# Patient Record
Sex: Male | Born: 1935
Health system: Southern US, Community
[De-identification: ages and names within clinical notes are randomized; demographics above are authoritative.]

## PROBLEM LIST (undated history)

## (undated) DIAGNOSIS — I10 Essential (primary) hypertension: Secondary | ICD-10-CM

## (undated) DIAGNOSIS — I82409 Acute embolism and thrombosis of unspecified deep veins of unspecified lower extremity: Secondary | ICD-10-CM

## (undated) DIAGNOSIS — G629 Polyneuropathy, unspecified: Secondary | ICD-10-CM

## (undated) DIAGNOSIS — H8110 Benign paroxysmal vertigo, unspecified ear: Secondary | ICD-10-CM

## (undated) DIAGNOSIS — I499 Cardiac arrhythmia, unspecified: Secondary | ICD-10-CM

## (undated) DIAGNOSIS — E785 Hyperlipidemia, unspecified: Secondary | ICD-10-CM

## (undated) DIAGNOSIS — M1990 Unspecified osteoarthritis, unspecified site: Secondary | ICD-10-CM

## (undated) DIAGNOSIS — H409 Unspecified glaucoma: Secondary | ICD-10-CM

## (undated) DIAGNOSIS — F0390 Unspecified dementia without behavioral disturbance: Secondary | ICD-10-CM

## (undated) DIAGNOSIS — I82401 Acute embolism and thrombosis of unspecified deep veins of right lower extremity: Secondary | ICD-10-CM

## (undated) DIAGNOSIS — N200 Calculus of kidney: Secondary | ICD-10-CM

## (undated) DIAGNOSIS — N400 Enlarged prostate without lower urinary tract symptoms: Secondary | ICD-10-CM

## (undated) DIAGNOSIS — N183 Chronic kidney disease, stage 3 unspecified: Secondary | ICD-10-CM

## (undated) HISTORY — DX: Unspecified osteoarthritis, unspecified site: M19.90

## (undated) HISTORY — DX: Polyneuropathy, unspecified: G62.9

## (undated) HISTORY — DX: Chronic kidney disease, stage 3 unspecified: N18.30

## (undated) HISTORY — PX: NO PAST SURGERIES: SHX2092

## (undated) HISTORY — DX: Unspecified glaucoma: H40.9

## (undated) HISTORY — DX: Essential (primary) hypertension: I10

## (undated) HISTORY — DX: Acute embolism and thrombosis of unspecified deep veins of right lower extremity: I82.401

## (undated) HISTORY — DX: Hyperlipidemia, unspecified: E78.5

## (undated) HISTORY — DX: Cardiac arrhythmia, unspecified: I49.9

## (undated) HISTORY — DX: Calculus of kidney: N20.0

## (undated) HISTORY — DX: Benign paroxysmal vertigo, unspecified ear: H81.10

## (undated) HISTORY — PX: COLONOSCOPY: SHX174

---

## 2007-11-16 ENCOUNTER — Inpatient Hospital Stay: Payer: Self-pay | Admitting: Internal Medicine

## 2007-11-16 ENCOUNTER — Other Ambulatory Visit: Payer: Self-pay

## 2007-11-17 ENCOUNTER — Other Ambulatory Visit: Payer: Self-pay

## 2009-11-29 LAB — HM COLONOSCOPY: HM Colonoscopy: NORMAL

## 2011-02-02 ENCOUNTER — Ambulatory Visit: Payer: Self-pay

## 2011-02-19 ENCOUNTER — Ambulatory Visit: Payer: Self-pay | Admitting: Family Medicine

## 2013-08-02 ENCOUNTER — Ambulatory Visit: Payer: Self-pay | Admitting: Family Medicine

## 2013-08-02 LAB — HEMOGLOBIN A1C: Hgb A1c MFr Bld: 5.5 % (ref 4.0–6.0)

## 2013-12-08 LAB — LIPID PANEL
Cholesterol: 173 mg/dL (ref 0–200)
HDL: 63 mg/dL (ref 35–70)
LDL Cholesterol: 93 mg/dL
Triglycerides: 85 mg/dL (ref 40–160)

## 2014-08-23 ENCOUNTER — Ambulatory Visit (INDEPENDENT_AMBULATORY_CARE_PROVIDER_SITE_OTHER): Payer: Commercial Managed Care - HMO | Admitting: Family Medicine

## 2014-08-23 ENCOUNTER — Encounter: Payer: Self-pay | Admitting: Family Medicine

## 2014-08-23 VITALS — BP 118/70 | HR 88 | Temp 98.2°F | Resp 16 | Ht 69.0 in | Wt 193.4 lb

## 2014-08-23 DIAGNOSIS — N183 Chronic kidney disease, stage 3 unspecified: Secondary | ICD-10-CM | POA: Insufficient documentation

## 2014-08-23 DIAGNOSIS — R600 Localized edema: Secondary | ICD-10-CM | POA: Insufficient documentation

## 2014-08-23 DIAGNOSIS — H8110 Benign paroxysmal vertigo, unspecified ear: Secondary | ICD-10-CM | POA: Insufficient documentation

## 2014-08-23 DIAGNOSIS — Z125 Encounter for screening for malignant neoplasm of prostate: Secondary | ICD-10-CM

## 2014-08-23 DIAGNOSIS — G629 Polyneuropathy, unspecified: Secondary | ICD-10-CM | POA: Insufficient documentation

## 2014-08-23 DIAGNOSIS — Z0000 Encounter for general adult medical examination without abnormal findings: Secondary | ICD-10-CM | POA: Diagnosis not present

## 2014-08-23 LAB — POC HEMOCCULT BLD/STL (OFFICE/1-CARD/DIAGNOSTIC): Fecal Occult Blood, POC: NEGATIVE

## 2014-08-23 NOTE — Addendum Note (Signed)
Addended by: Ashok Norris on: 08/23/2014 01:36 PM   Modules accepted: Miquel Dunn

## 2014-08-23 NOTE — Progress Notes (Signed)
Name: Ronald Fleming   MRN: [id number]    DOB: Jun 14, 1935   Date:08/23/2014       Progress Note  Subjective  Chief Complaint  Chief Complaint  Patient presents with  . Annual Exam    HPI   79 year old male presents for his annual H&P.  Past Medical History  Diagnosis Date  . Arrhythmia   . Neuropathy   . Benign positional vertigo     History  Substance Use Topics  . Smoking status: Never Smoker   . Smokeless tobacco: Not on file  . Alcohol Use: No     Current outpatient prescriptions:  .  dabigatran (PRADAXA) 150 MG CAPS capsule, Take 150 mg by mouth 2 (two) times daily., Disp: , Rfl:  .  doxazosin (CARDURA) 4 MG tablet, Take 4 mg by mouth daily., Disp: , Rfl:  .  lisinopril (PRINIVIL,ZESTRIL) 20 MG tablet, Take 20 mg by mouth daily., Disp: , Rfl:  .  lovastatin (MEVACOR) 20 MG tablet, Take 20 mg by mouth at bedtime., Disp: , Rfl:   No Known Allergies  Review of Systems  Constitutional: Negative for fever, chills and weight loss.  HENT: Negative for congestion, hearing loss, sore throat and tinnitus.   Eyes: Negative for blurred vision, double vision and redness.  Respiratory: Negative for cough, hemoptysis and shortness of breath.   Cardiovascular: Positive for palpitations (followed by cardilologist). Negative for chest pain, orthopnea, claudication and leg swelling.  Gastrointestinal: Negative for heartburn, nausea, vomiting, diarrhea, constipation and blood in stool.  Genitourinary: Negative for dysuria, urgency, frequency and hematuria.  Musculoskeletal: Negative for myalgias, back pain, joint pain, falls and neck pain.  Skin: Negative for itching.  Neurological: Negative for dizziness, tingling, tremors, focal weakness, seizures, loss of consciousness, weakness and headaches.  Endo/Heme/Allergies: Does not bruise/bleed easily.  Psychiatric/Behavioral: Negative for depression and substance abuse. The patient is not nervous/anxious and does not have insomnia.       Objective  Filed Vitals:   08/23/14 1049  BP: 118/70  Pulse: 88  Temp: 98.2 F (36.8 C)  Resp: 16  Height: 5\' 9"  (1.753 m)  Weight: 193 lb 6 oz (87.714 kg)  SpO2: 97%     Physical Exam  Constitutional: He is oriented to person, place, and time and well-developed, well-nourished, and in no distress.  HENT:  Head: Normocephalic.  Eyes: EOM are normal. Pupils are equal, round, and reactive to light.  Neck: Normal range of motion. Neck supple. No thyromegaly present.  Cardiovascular: Normal rate, regular rhythm and normal heart sounds.   No murmur heard. Pulmonary/Chest: Effort normal and breath sounds normal. No respiratory distress. He has no wheezes.  Abdominal: Soft. Bowel sounds are normal.  Musculoskeletal: Normal range of motion. He exhibits no edema.  Lymphadenopathy:    He has no cervical adenopathy.  Neurological: He is alert and oriented to person, place, and time. No cranial nerve deficit. Gait normal. Coordination normal.  Skin: Skin is warm and dry. No rash noted.  Psychiatric: Affect and judgment normal.      Assessment & Plan  1. Routine medical exam   2. Prostate cancer screening  - PSA

## 2014-08-23 NOTE — Addendum Note (Signed)
Addended by: Ashok Norris on: 08/23/2014 01:32 PM   Modules accepted: Miquel Dunn

## 2014-08-23 NOTE — Patient Instructions (Signed)
F/u 1 mo for htn, hyperlipidemia

## 2014-08-24 LAB — PSA: Prostate Specific Ag, Serum: 6.1 ng/mL — ABNORMAL HIGH (ref 0.0–4.0)

## 2014-08-26 NOTE — Addendum Note (Signed)
Addended by: Lolita Rieger D on: 08/26/2014 08:14 AM   Modules accepted: Orders

## 2014-08-26 NOTE — Progress Notes (Signed)
Spoke to pt wife and went over results with her. She stated that they are going out of town for 2 weeks and wanted the appt to be made after that time.

## 2014-09-05 ENCOUNTER — Telehealth: Payer: Self-pay | Admitting: Family Medicine

## 2014-09-05 NOTE — Telephone Encounter (Signed)
Arlina Robes (wife) states that patient has been referred to Dr Tammi Klippel (urology)  For 09-13-14. They are needing to reschedule the appointment because he is currently out of town and will not return until late that day. Please reschedule this for them. They keep trying to call to reschedule but is not ablel to get through to that office. Please call and leave message on both phones with the new appointment date. Thank you [phone] and [phone]

## 2014-09-06 NOTE — Telephone Encounter (Signed)
Informed [facility] of the new appointment time. Thank you

## 2014-09-06 NOTE — Telephone Encounter (Signed)
Spoke to [place] at [facility]. Cancel June 28 appointment and rescheduled appointment to September 27, 2014 at 8:30 am with Dr. Louis Meckel

## 2014-09-13 ENCOUNTER — Ambulatory Visit: Payer: Commercial Managed Care - HMO

## 2014-09-22 ENCOUNTER — Other Ambulatory Visit: Payer: Self-pay | Admitting: Family Medicine

## 2014-09-22 ENCOUNTER — Encounter: Payer: Self-pay | Admitting: Family Medicine

## 2014-09-22 ENCOUNTER — Ambulatory Visit (INDEPENDENT_AMBULATORY_CARE_PROVIDER_SITE_OTHER): Payer: Commercial Managed Care - HMO | Admitting: Family Medicine

## 2014-09-22 VITALS — BP 124/68 | HR 80 | Temp 98.1°F | Resp 16 | Ht 69.0 in | Wt 193.8 lb

## 2014-09-22 DIAGNOSIS — Z86718 Personal history of other venous thrombosis and embolism: Secondary | ICD-10-CM

## 2014-09-22 DIAGNOSIS — N183 Chronic kidney disease, stage 3 unspecified: Secondary | ICD-10-CM

## 2014-09-22 DIAGNOSIS — I4891 Unspecified atrial fibrillation: Secondary | ICD-10-CM | POA: Diagnosis not present

## 2014-09-22 DIAGNOSIS — I10 Essential (primary) hypertension: Secondary | ICD-10-CM | POA: Diagnosis not present

## 2014-09-22 DIAGNOSIS — R600 Localized edema: Secondary | ICD-10-CM | POA: Diagnosis not present

## 2014-09-22 DIAGNOSIS — I80201 Phlebitis and thrombophlebitis of unspecified deep vessels of right lower extremity: Secondary | ICD-10-CM

## 2014-09-22 DIAGNOSIS — E7800 Pure hypercholesterolemia, unspecified: Secondary | ICD-10-CM

## 2014-09-22 MED ORDER — DABIGATRAN ETEXILATE MESYLATE 150 MG PO CAPS
150.0000 mg | ORAL_CAPSULE | Freq: Two times a day (BID) | ORAL | Status: DC
Start: 2014-09-22 — End: 2014-09-22

## 2014-09-22 NOTE — Patient Instructions (Signed)
Four-month follow-up

## 2014-09-22 NOTE — Progress Notes (Signed)
Name: Ronald Fleming   MRN: [id number]    DOB: 10/18/1935   Date:09/22/2014       Progress Note  Subjective  Chief Complaint  Chief Complaint  Patient presents with  . Hypertension  . Hyperlipidemia  . Atrial Fibrillation    Hypertension This is a chronic problem. The current episode started more than 1 year ago. The problem is unchanged. The problem is controlled. Associated symptoms include anxiety and palpitations. Pertinent negatives include no blurred vision, chest pain, headaches, neck pain, orthopnea or shortness of breath. There are no associated agents to hypertension. Risk factors for coronary artery disease include dyslipidemia, male gender and sedentary lifestyle. Past treatments include ACE inhibitors. The current treatment provides moderate improvement. There are no compliance problems.   Hyperlipidemia This is a chronic problem. The current episode started more than 1 year ago. The problem is controlled. Recent lipid tests were reviewed and are normal. Factors aggravating his hyperlipidemia include fatty foods and thiazides. Pertinent negatives include no chest pain, focal weakness, myalgias or shortness of breath. Current antihyperlipidemic treatment includes statins. There are no compliance problems.  Risk factors for coronary artery disease include dyslipidemia, hypertension, male sex and a sedentary lifestyle.  Atrial Fibrillation Presents for follow-up visit. Symptoms include hypertension and palpitations. Symptoms are negative for chest pain, dizziness, shortness of breath and weakness. The symptoms have been resolved. Compliance with prior treatments has been good. Past medical history includes atrial fibrillation and hyperlipidemia.    History of DVT of the right lower extremity  The patient has remote history of right lower extremity DVT. He's currently production on a regular basis for his atrial fibrillation. He's had no recent swelling edema or pain of the right lower  extremity.  Past Medical History  Diagnosis Date  . Arrhythmia   . Neuropathy   . Benign positional vertigo   . Hypertension   . Hyperlipidemia   . Deep vein blood clot of right lower extremity     History  Substance Use Topics  . Smoking status: Never Smoker   . Smokeless tobacco: Not on file  . Alcohol Use: No     Current outpatient prescriptions:  .  aspirin 81 MG tablet, Take 81 mg by mouth daily., Disp: , Rfl:  .  dabigatran (PRADAXA) 150 MG CAPS capsule, Take 1 capsule (150 mg total) by mouth 2 (two) times daily., Disp: 60 capsule, Rfl: 5 .  doxazosin (CARDURA) 4 MG tablet, Take 4 mg by mouth daily., Disp: , Rfl:  .  lisinopril (PRINIVIL,ZESTRIL) 20 MG tablet, Take 20 mg by mouth daily., Disp: , Rfl:  .  lovastatin (MEVACOR) 20 MG tablet, Take 20 mg by mouth at bedtime., Disp: , Rfl:   No Known Allergies  Review of Systems  Constitutional: Negative for fever, chills and weight loss.  HENT: Negative for congestion, hearing loss, sore throat and tinnitus.   Eyes: Negative for blurred vision, double vision and redness.  Respiratory: Negative for cough, hemoptysis and shortness of breath.   Cardiovascular: Positive for palpitations. Negative for chest pain, orthopnea, claudication and leg swelling.  Gastrointestinal: Negative for heartburn, nausea, vomiting, diarrhea, constipation and blood in stool.  Genitourinary: Negative for dysuria, urgency, frequency and hematuria.  Musculoskeletal: Negative for myalgias, back pain, joint pain, falls and neck pain.  Skin: Negative for itching.  Neurological: Negative for dizziness, tingling, tremors, focal weakness, seizures, loss of consciousness, weakness and headaches.  Endo/Heme/Allergies: Does not bruise/bleed easily.  Psychiatric/Behavioral: Negative for depression and substance abuse.  The patient is not nervous/anxious and does not have insomnia.      Objective  Filed Vitals:   09/22/14 0905  BP: 124/68  Pulse: 80   Temp: 98.1 F (36.7 C)  TempSrc: Oral  Resp: 16  Height: 5\' 9"  (1.753 m)  Weight: 193 lb 12.8 oz (87.907 kg)  SpO2: 98%     Physical Exam  Constitutional: He is oriented to person, place, and time and well-developed, well-nourished, and in no distress.  HENT:  Head: Normocephalic.  Eyes: EOM are normal. Pupils are equal, round, and reactive to light.  Neck: Normal range of motion. Neck supple. No thyromegaly present.  Cardiovascular: Normal rate, regular rhythm and normal heart sounds.   No murmur heard. Pulmonary/Chest: Effort normal and breath sounds normal. No respiratory distress. He has no wheezes.  Abdominal: Soft. Bowel sounds are normal.  Musculoskeletal: Normal range of motion. He exhibits no edema.  Lymphadenopathy:    He has no cervical adenopathy.  Neurological: He is alert and oriented to person, place, and time. No cranial nerve deficit. Gait normal. Coordination normal.  Skin: Skin is warm and dry. No rash noted.  Psychiatric: Affect and judgment normal.      Assessment & Plan  1. Atrial fibrillation, unspecified Stable  2. Benign essential HTN Controlled  3. Phlebitis and thrombophlebitis of deep vessels of lower extremities, right No recurrence  4. Chronic kidney disease, stage III (moderate) Resolved  5. Edema of right lower extremity Resolved  6. Hypercholesteremia Well-controlled  7. History of DVT of lower extremity Remotely

## 2014-09-27 ENCOUNTER — Ambulatory Visit (INDEPENDENT_AMBULATORY_CARE_PROVIDER_SITE_OTHER): Payer: Commercial Managed Care - HMO | Admitting: Urology

## 2014-09-27 ENCOUNTER — Encounter: Payer: Self-pay | Admitting: Urology

## 2014-09-27 VITALS — BP 121/68 | HR 69 | Ht 71.0 in | Wt 191.8 lb

## 2014-09-27 DIAGNOSIS — R972 Elevated prostate specific antigen [PSA]: Secondary | ICD-10-CM

## 2014-09-27 NOTE — Progress Notes (Signed)
I have been asked to see the patient by Dr. Ashok Norris, for evaluation and management of an elevated PSA.  History of present illness: Patient was found to have an elevated PSA which was drawn as part of a prostate cancer screening.  He has no family history of prostate cancer.  The patient denies any bone pain, new back pain, or lower extremity edema.  The patient denies any changes in his voiding symptoms over the last 6 months.  Specifically he denies dysuria or hematuria. The patient relates that he had a negative prostate biopsy in 2012 Dr. Olena Heckle a urologist to formally practiced in [facility].  PSA History: 6.1 on 08/23/14 4.7 on 01/2012  IPSS:     IPSS      09/27/14 0800       International Prostate Symptom Score   How often have you had the sensation of not emptying your bladder? Not at All     How often have you had to urinate less than every two hours? Less than 1 in 5 times     How often have you found you stopped and started again several times when you urinated? Not at All     How often have you found it difficult to postpone urination? About half the time     How often have you had a weak urinary stream? Not at All     How often have you had to strain to start urination? Not at All     How many times did you typically get up at night to urinate? 2 Times     Total IPSS Score 6     Quality of Life due to urinary symptoms   If you were to spend the rest of your life with your urinary condition just the way it is now how would you feel about that? Pleased          Review of systems: A 12 point comprehensive review of systems was obtained and is negative unless otherwise stated in the history of present illness.  Patient Active Problem List   Diagnosis Date Noted  . A-fib 08/23/2014  . Benign paroxysmal positional nystagmus 08/23/2014  . Edema leg 08/23/2014  . Hypercholesteremia 08/23/2014  . BP (high blood pressure) 08/23/2014  . Chronic kidney  disease, stage III (moderate) 08/23/2014  . Bad memory 08/23/2014  . Neuropathy 08/23/2014  . Phlebitis and thrombophlebitis of deep vessels of lower extremities 02/24/2008  . Benign essential HTN 08/27/2006  . Hypercholesterolemia without hypertriglyceridemia 08/27/2006    Current Outpatient Prescriptions on File Prior to Visit  Medication Sig Dispense Refill  . aspirin 81 MG tablet Take 81 mg by mouth daily.    Marland Kitchen doxazosin (CARDURA) 4 MG tablet Take 4 mg by mouth daily.    Marland Kitchen lisinopril (PRINIVIL,ZESTRIL) 20 MG tablet Take 20 mg by mouth daily.    Marland Kitchen lovastatin (MEVACOR) 20 MG tablet Take 20 mg by mouth at bedtime.    Marland Kitchen PRADAXA 150 MG CAPS capsule TAKE ONE CAPSULE BY MOUTH TWICE DAILY 60 capsule 0   No current facility-administered medications on file prior to visit.    Past Medical History  Diagnosis Date  . Arrhythmia   . Neuropathy   . Benign positional vertigo   . Hypertension   . Hyperlipidemia   . Deep vein blood clot of right lower extremity   . Arthritis   . Glaucoma     Past Surgical History  Procedure Laterality Date  .  No past surgeries      History  Substance Use Topics  . Smoking status: Never Smoker   . Smokeless tobacco: Not on file  . Alcohol Use: No    Family History  Problem Relation Age of Onset  . Coronary artery disease Father   . Kidney disease Neg Hx   . Prostate cancer Neg Hx   . Bladder Cancer Neg Hx     PE: Filed Vitals:   09/27/14 0827  BP: 121/68  Pulse: 69  Height: 5\' 11"  (1.803 m)  Weight: 87 kg (191 lb 12.8 oz)   Patient appears to be in no acute distress  patient is alert and oriented x3 Atraumatic normocephalic head No cervical or supraclavicular lymphadenopathy appreciated No increased work of breathing, no audible wheezes/rhonchi No peripheral edema Abdomen is soft, nontender, nondistended, no CVA or suprapubic tenderness Digital rectal exam reveals a +2 prostate in size, symmetric without nodularity Lower  extremities are symmetric without appreciable edema Grossly neurologically intact No identifiable skin lesions  No results for input(s): WBC, HGB, HCT in the last 72 hours. No results for input(s): NA, K, CL, CO2, GLUCOSE, BUN, CREATININE, CALCIUM in the last 72 hours. No results for input(s): LABPT, INR in the last 72 hours. No results for input(s): LABURIN in the last 72 hours. No results found for this or any previous visit.  Imaging: none  Imp: 79 year old male with a mildly elevated PSA without any other significant symptoms including progressive lower urinary tract symptoms, hematuria, dysuria, bone or back pain, or constitutional symptoms. He has moderate comorbidities. Recommendations: I discussed with the patient the implications of an elevated PSA. Specifically, I explained to the patient that in a 79 year old gentleman with a mildly elevated PSA with a fairly recent negative prostate biopsy that his risk for advanced or aggressive prostate cancer was fairly low. Further, the risk of the patient dying from low-grade prostate cancer at this point in his life is also very low. We typically stop screening between the ages of 83 and 58 for this reason. At his age, the treatment for prostate cancer is palliative most often and is not initiated until the patient becomes symptomatic including worsening voiding symptoms or bone pain. As such, I do not recommend that we check anymore PSAs unless the patient becomes symptomatic as stated. However, I do think that an annual digital rectal exam still has relevance, because if it is abnormal with a nodule, that is indicative of advanced disease and at that point the patient would benefit from closer surveillance/androgen deprivation therapy.  In summary: Stop checking PSAs Monitor the patient clinically for progressive voiding symptoms, hematuria, or bone pain Check digital rectal exam annually Follow-up when necessary  Ronald Fleming  W

## 2014-10-10 ENCOUNTER — Other Ambulatory Visit: Payer: Self-pay

## 2014-10-10 DIAGNOSIS — Z1211 Encounter for screening for malignant neoplasm of colon: Secondary | ICD-10-CM

## 2014-10-10 DIAGNOSIS — Z0000 Encounter for general adult medical examination without abnormal findings: Secondary | ICD-10-CM

## 2014-10-10 LAB — POC HEMOCCULT BLD/STL (HOME/3-CARD/SCREEN)
Card #3 Fecal Occult Blood, POC: NEGATIVE
FECAL OCCULT BLD: NEGATIVE
Fecal Occult Blood, POC: NEGATIVE

## 2014-10-22 ENCOUNTER — Other Ambulatory Visit: Payer: Self-pay | Admitting: Family Medicine

## 2014-11-07 ENCOUNTER — Other Ambulatory Visit: Payer: Self-pay | Admitting: Family Medicine

## 2014-11-21 ENCOUNTER — Other Ambulatory Visit: Payer: Self-pay | Admitting: Family Medicine

## 2014-12-22 ENCOUNTER — Other Ambulatory Visit: Payer: Self-pay | Admitting: Family Medicine

## 2015-01-18 ENCOUNTER — Telehealth: Payer: Self-pay | Admitting: Family Medicine

## 2015-01-18 NOTE — Telephone Encounter (Signed)
ERRENOUS °

## 2015-01-23 ENCOUNTER — Other Ambulatory Visit: Payer: Self-pay | Admitting: Family Medicine

## 2015-01-24 ENCOUNTER — Ambulatory Visit: Payer: Commercial Managed Care - HMO | Admitting: Family Medicine

## 2015-02-01 ENCOUNTER — Ambulatory Visit (INDEPENDENT_AMBULATORY_CARE_PROVIDER_SITE_OTHER): Payer: Commercial Managed Care - HMO | Admitting: Family Medicine

## 2015-02-01 ENCOUNTER — Encounter: Payer: Self-pay | Admitting: Family Medicine

## 2015-02-01 VITALS — BP 112/72 | HR 85 | Temp 97.8°F | Resp 16 | Ht 71.0 in | Wt 195.1 lb

## 2015-02-01 DIAGNOSIS — I80201 Phlebitis and thrombophlebitis of unspecified deep vessels of right lower extremity: Secondary | ICD-10-CM | POA: Diagnosis not present

## 2015-02-01 DIAGNOSIS — I4891 Unspecified atrial fibrillation: Secondary | ICD-10-CM | POA: Insufficient documentation

## 2015-02-01 DIAGNOSIS — I10 Essential (primary) hypertension: Secondary | ICD-10-CM | POA: Diagnosis not present

## 2015-02-01 DIAGNOSIS — Z23 Encounter for immunization: Secondary | ICD-10-CM

## 2015-02-01 DIAGNOSIS — I480 Paroxysmal atrial fibrillation: Secondary | ICD-10-CM | POA: Diagnosis not present

## 2015-02-01 DIAGNOSIS — E7800 Pure hypercholesterolemia, unspecified: Secondary | ICD-10-CM | POA: Diagnosis not present

## 2015-02-01 NOTE — Progress Notes (Signed)
Name: Ronald Fleming   MRN: [id number]    DOB: 03/18/1936   Date:02/01/2015       Progress Note  Subjective  Chief Complaint  Chief Complaint  Patient presents with  . Hypertension    4 month recheck  . Hyperlipidemia  . Atrial Fibrillation    HPI  Hypertension   Patient presents for follow-up of hypertension. It has been present for over over 5 years.  Patient states that there is compliance with medical regimen which consists of lisinopril 20 mg daily. There is no end organ disease. Cardiac risk factors include hypertension hyperlipidemia and diabetes.  Exercise regimen consist of walking .  Diet consist of sodium restriction .  Hyperlipidemia  Patient has a history of hyperlipidemia for over 5 years.  Current medical regimen consist of lovastatin 20 mg daily at bedtime .  Compliance is good .  Diet and exercise are currently followed regularly .  Risk factors for cardiovascular disease include hyperlipidemia and hypertension .   There have been no side effects from the medication.    Atrial fibrillation  Patient has not noted any irregularity of his heartbeat of recent. He has seen the cardiologist with regularity. He is currently on a regimen consisting of aspirin 81 mg daily per DEXA 150 mg daily. He is not on any rate limiting agent at this time.   Past Medical History  Diagnosis Date  . Arrhythmia   . Neuropathy ([place])   . Benign positional vertigo   . Hypertension   . Hyperlipidemia   . Deep vein blood clot of right lower extremity ([place])   . Arthritis   . Glaucoma     Social History  Substance Use Topics  . Smoking status: Never Smoker   . Smokeless tobacco: Not on file  . Alcohol Use: No     Current outpatient prescriptions:  .  aspirin 81 MG tablet, Take 81 mg by mouth daily., Disp: , Rfl:  .  doxazosin (CARDURA) 4 MG tablet, TAKE 1 TABLET EVERY DAY, Disp: 90 tablet, Rfl: 3 .  lisinopril (PRINIVIL,ZESTRIL) 20 MG tablet, TAKE 1 TABLET EVERY DAY, Disp: 90  tablet, Rfl: 1 .  lovastatin (MEVACOR) 20 MG tablet, TAKE 1 TABLET EVERY DAY, Disp: 90 tablet, Rfl: 1 .  PRADAXA 150 MG CAPS capsule, TAKE ONE CAPSULE BY MOUTH TWICE DAILY, Disp: 60 capsule, Rfl: 0  No Known Allergies  Review of Systems  Constitutional: Negative for fever, chills and weight loss.  HENT: Negative for congestion, hearing loss, sore throat and tinnitus.   Eyes: Negative for blurred vision, double vision and redness.  Respiratory: Negative for cough, hemoptysis and shortness of breath.   Cardiovascular: Positive for palpitations. Negative for chest pain, orthopnea, claudication and leg swelling.  Gastrointestinal: Negative for heartburn, nausea, vomiting, diarrhea, constipation and blood in stool.  Genitourinary: Negative for dysuria, urgency, frequency and hematuria.  Musculoskeletal: Negative for myalgias, back pain, joint pain, falls and neck pain.  Skin: Negative for itching.  Neurological: Negative for dizziness, tingling, tremors, focal weakness, seizures, loss of consciousness, weakness and headaches.  Endo/Heme/Allergies: Does not bruise/bleed easily.  Psychiatric/Behavioral: Negative for depression and substance abuse. The patient is not nervous/anxious and does not have insomnia.      Objective  Filed Vitals:   02/01/15 1015  BP: 112/72  Pulse: 85  Temp: 97.8 F (36.6 C)  TempSrc: Oral  Resp: 16  Height: 5\' 11"  (1.803 m)  Weight: 195 lb 1.6 oz (88.497 kg)  SpO2: 97%  Physical Exam  Constitutional: He is oriented to person, place, and time and well-developed, well-nourished, and in no distress.  HENT:  Head: Normocephalic.  Eyes: EOM are normal. Pupils are equal, round, and reactive to light.  Neck: Normal range of motion. Neck supple. No thyromegaly present.  Cardiovascular: Regular rhythm and normal heart sounds.   No murmur heard. Regular rate and rhythm with an occasional ectopic beat  Pulmonary/Chest: Effort normal and breath sounds normal.  No respiratory distress. He has no wheezes.  Abdominal: Soft. Bowel sounds are normal.  Musculoskeletal: Normal range of motion. He exhibits no edema.  Lymphadenopathy:    He has no cervical adenopathy.  Neurological: He is alert and oriented to person, place, and time. No cranial nerve deficit. Gait normal. Coordination normal.  Skin: Skin is warm and dry. No rash noted.  Psychiatric: Affect and judgment normal.      Assessment & Plan   1. Benign essential HTN Well controlled - Comprehensive Metabolic Panel (CMET) - TSH  2. Paroxysmal atrial fibrillation (HCC) Continue Pradaxa and cardiology follow-up - Comprehensive Metabolic Panel (CMET) - TSH  3. Phlebitis and thrombophlebitis of deep vessels of lower extremities, right (HCC) No recurrence   4. Hypercholesteremia Labs - Comprehensive Metabolic Panel (CMET) - Lipid panel - TSH  5. Need for influenza vaccination Given today

## 2015-02-02 LAB — COMMENT

## 2015-02-02 LAB — COMPREHENSIVE METABOLIC PANEL
A/G RATIO: 1.6 (ref 1.1–2.5)
ALT: 13 IU/L (ref 0–44)
AST: 20 IU/L (ref 0–40)
Albumin: 4.4 g/dL (ref 3.5–4.8)
Alkaline Phosphatase: 75 IU/L (ref 39–117)
BUN/Creatinine Ratio: 13 (ref 10–22)
BUN: 18 mg/dL (ref 8–27)
Bilirubin Total: 0.4 mg/dL (ref 0.0–1.2)
CALCIUM: 9.1 mg/dL (ref 8.6–10.2)
CO2: 24 mmol/L (ref 18–29)
CREATININE: 1.34 mg/dL — AB (ref 0.76–1.27)
Chloride: 103 mmol/L (ref 97–106)
GFR, EST AFRICAN AMERICAN: 59 mL/min/{1.73_m2} — AB (ref >59)
GFR, EST NON AFRICAN AMERICAN: 51 mL/min/{1.73_m2} — AB (ref >59)
GLUCOSE: 94 mg/dL (ref 65–99)
Globulin, Total: 2.8 g/dL (ref 1.5–4.5)
POTASSIUM: 4.7 mmol/L (ref 3.5–5.2)
Sodium: 144 mmol/L (ref 136–144)
TOTAL PROTEIN: 7.2 g/dL (ref 6.0–8.5)

## 2015-02-02 LAB — LIPID PANEL
CHOL/HDL RATIO: 2.6 ratio (ref 0.0–5.0)
Cholesterol, Total: 168 mg/dL (ref 100–199)
HDL: 65 mg/dL (ref >39)
LDL Calculated: 83 mg/dL (ref 0–99)
TRIGLYCERIDES: 99 mg/dL (ref 0–149)
VLDL CHOLESTEROL CAL: 20 mg/dL (ref 5–40)

## 2015-02-02 LAB — TSH: TSH: 0.978 u[IU]/mL (ref 0.450–4.500)

## 2015-02-08 ENCOUNTER — Telehealth: Payer: Self-pay | Admitting: Emergency Medicine

## 2015-02-08 NOTE — Telephone Encounter (Signed)
Patient notified of lab results

## 2015-02-16 ENCOUNTER — Telehealth: Payer: Self-pay | Admitting: Family Medicine

## 2015-02-16 ENCOUNTER — Other Ambulatory Visit: Payer: Self-pay | Admitting: Family Medicine

## 2015-02-16 MED ORDER — DOXAZOSIN MESYLATE 4 MG PO TABS: 4.0000 mg | ORAL_TABLET | Freq: Every day | ORAL | Status: DC

## 2015-02-16 MED ORDER — LISINOPRIL 20 MG PO TABS: 20.0000 mg | ORAL_TABLET | Freq: Every day | ORAL | Status: DC

## 2015-02-16 NOTE — Telephone Encounter (Signed)
Patient has not received his lisinopril nor his doxazosin prescription from [facility]. They told him it would take 5-7 days. Patient is requesting that you send over just enough pills for both prescriptions to [facility].

## 2015-02-16 NOTE — Telephone Encounter (Signed)
Did not see any previous orders so I sent a 7 day supply to [facility] and full refills to [facility].

## 2015-02-16 NOTE — Telephone Encounter (Signed)
Patient informed. 

## 2015-02-23 ENCOUNTER — Other Ambulatory Visit: Payer: Self-pay | Admitting: Family Medicine

## 2015-03-23 ENCOUNTER — Other Ambulatory Visit: Payer: Self-pay | Admitting: Family Medicine

## 2015-04-12 ENCOUNTER — Other Ambulatory Visit: Payer: Self-pay | Admitting: Family Medicine

## 2015-04-17 ENCOUNTER — Ambulatory Visit (INDEPENDENT_AMBULATORY_CARE_PROVIDER_SITE_OTHER): Payer: Commercial Managed Care - HMO

## 2015-04-17 DIAGNOSIS — Z23 Encounter for immunization: Secondary | ICD-10-CM | POA: Diagnosis not present

## 2015-04-24 ENCOUNTER — Other Ambulatory Visit: Payer: Self-pay | Admitting: Family Medicine

## 2015-05-08 ENCOUNTER — Ambulatory Visit: Payer: Commercial Managed Care - HMO | Admitting: Family Medicine

## 2015-05-16 ENCOUNTER — Ambulatory Visit: Payer: Commercial Managed Care - HMO

## 2015-05-18 ENCOUNTER — Ambulatory Visit: Payer: Commercial Managed Care - HMO | Admitting: Family Medicine

## 2015-05-24 ENCOUNTER — Other Ambulatory Visit: Payer: Self-pay | Admitting: Family Medicine

## 2015-06-23 ENCOUNTER — Ambulatory Visit: Payer: Commercial Managed Care - HMO | Admitting: Family Medicine

## 2015-06-23 ENCOUNTER — Other Ambulatory Visit: Payer: Self-pay | Admitting: Family Medicine

## 2015-07-20 ENCOUNTER — Telehealth: Payer: Self-pay | Admitting: Family Medicine

## 2015-07-20 NOTE — Telephone Encounter (Signed)
Patient has [employer] and has seen Dr Clayborn Bigness before but is needing another referral for Follow up visit

## 2015-07-22 ENCOUNTER — Other Ambulatory Visit: Payer: Self-pay | Admitting: Family Medicine

## 2015-08-03 ENCOUNTER — Encounter: Payer: Self-pay | Admitting: Family Medicine

## 2015-08-03 ENCOUNTER — Ambulatory Visit (INDEPENDENT_AMBULATORY_CARE_PROVIDER_SITE_OTHER): Payer: Commercial Managed Care - HMO | Admitting: Family Medicine

## 2015-08-03 VITALS — BP 136/78 | HR 96 | Temp 97.9°F | Resp 16 | Ht 71.0 in | Wt 195.9 lb

## 2015-08-03 DIAGNOSIS — Z86718 Personal history of other venous thrombosis and embolism: Secondary | ICD-10-CM

## 2015-08-03 DIAGNOSIS — I480 Paroxysmal atrial fibrillation: Secondary | ICD-10-CM

## 2015-08-03 DIAGNOSIS — R2689 Other abnormalities of gait and mobility: Secondary | ICD-10-CM

## 2015-08-03 DIAGNOSIS — R29818 Other symptoms and signs involving the nervous system: Secondary | ICD-10-CM | POA: Diagnosis not present

## 2015-08-03 DIAGNOSIS — I10 Essential (primary) hypertension: Secondary | ICD-10-CM

## 2015-08-03 DIAGNOSIS — R413 Other amnesia: Secondary | ICD-10-CM

## 2015-08-03 DIAGNOSIS — N400 Enlarged prostate without lower urinary tract symptoms: Secondary | ICD-10-CM | POA: Diagnosis not present

## 2015-08-03 DIAGNOSIS — E7800 Pure hypercholesterolemia, unspecified: Secondary | ICD-10-CM | POA: Diagnosis not present

## 2015-08-03 MED ORDER — DOXAZOSIN MESYLATE 2 MG PO TABS: 4.0000 mg | ORAL_TABLET | Freq: Every day | ORAL | Status: DC

## 2015-08-03 MED ORDER — LOVASTATIN 20 MG PO TABS: 20.0000 mg | ORAL_TABLET | Freq: Every day | ORAL | Status: DC

## 2015-08-03 MED ORDER — LISINOPRIL 20 MG PO TABS: 20.0000 mg | ORAL_TABLET | Freq: Every day | ORAL | Status: DC

## 2015-08-03 MED ORDER — DABIGATRAN ETEXILATE MESYLATE 150 MG PO CAPS: 150.0000 mg | ORAL_CAPSULE | Freq: Two times a day (BID) | ORAL | Status: DC

## 2015-08-03 NOTE — Progress Notes (Signed)
Name: Ronald Fleming   MRN: [id number]    DOB: 06/12/1935   Date:08/03/2015       Progress Note  Subjective  Chief Complaint  Chief Complaint  Patient presents with  . Medication Refill    6 month F/U  . Hypertension    SOB occasionally  . Atrial Fibrillation    Stable on medication-denies any bruising   . Hyperlipidemia    HPI  HTN: he has been taking Lisinopril for bp for many years. He denies cough or chest pain. He has occasional palpitation and SOB.   Paroxysmal Afib: he sees Dr. Clayborn Bigness - cardiologist. He has occasional palpitation, some SOB when moving too fast. He is compliant with Pradaxa as prescribed, no easy bruising or blood in stools.    Hyperlipidemia: taking Mevacor , last lipid panel reviewed - done in Nov and at goal. Denies myalgia.   History of DVT/thrombophlebitis: it happened after a long flight , but took medication and wears compression stocking hoses.   Memory Changes: wife states that over the past few years he was getting forgetful while driving, but that has improved, however patient states that over the past year he has balance difficulty, he denies spinning sensation, symptoms usually in the morning he stumbles at times, no falls .  Patient Active Problem List   Diagnosis Date Noted  . History of DVT of lower extremity 08/03/2015  . BPH (benign prostatic hyperplasia) 08/03/2015  . Atrial fibrillation ([place]) 02/01/2015  . Benign paroxysmal positional nystagmus 08/23/2014  . Edema leg 08/23/2014  . Chronic kidney disease, stage III (moderate) 08/23/2014  . Bad memory 08/23/2014  . Neuropathy ([place]) 08/23/2014  . Phlebitis and thrombophlebitis of deep vessels of lower extremities ([place]) 02/24/2008  . Benign essential HTN 08/27/2006  . Hypercholesterolemia without hypertriglyceridemia 08/27/2006    Past Surgical History  Procedure Laterality Date  . No past surgeries      Family History  Problem Relation Age of Onset  . Coronary artery disease  Father   . Kidney disease Neg Hx   . Prostate cancer Neg Hx   . Bladder Cancer Neg Hx     Social History   Social History  . Marital Status: Married    Spouse Name: N/A  . Number of Children: N/A  . Years of Education: N/A   Occupational History  . Not on file.   Social History Main Topics  . Smoking status: Never Smoker   . Smokeless tobacco: Never Used  . Alcohol Use: No  . Drug Use: No  . Sexual Activity:    Partners: Female   Other Topics Concern  . Not on file   Social History Narrative     Current outpatient prescriptions:  .  aspirin 81 MG tablet, Take 81 mg by mouth daily., Disp: , Rfl:  .  dabigatran (PRADAXA) 150 MG CAPS capsule, Take 1 capsule (150 mg total) by mouth 2 (two) times daily., Disp: 60 capsule, Rfl: 5 .  doxazosin (CARDURA) 2 MG tablet, Take 2 tablets (4 mg total) by mouth daily., Disp: 90 tablet, Rfl: 1 .  lisinopril (PRINIVIL,ZESTRIL) 20 MG tablet, Take 1 tablet (20 mg total) by mouth daily., Disp: 90 tablet, Rfl: 1 .  lovastatin (MEVACOR) 20 MG tablet, Take 1 tablet (20 mg total) by mouth daily., Disp: 90 tablet, Rfl: 1  No Known Allergies   ROS  Constitutional: Negative for fever or weight change.  Respiratory: Negative for cough, occasional has  shortness of breath.  Cardiovascular: Negative for chest pain , no recent   palpitations.  Gastrointestinal: Negative for abdominal pain, no bowel changes.  Musculoskeletal: Negative for gait problem or joint swelling.  Skin: Negative for rash.  Neurological: Negative for dizziness ( but stumbles at times ) or headache.  No other specific complaints in a complete review of systems (except as listed in HPI above).  Objective  Filed Vitals:   08/03/15 1003  BP: 136/78  Pulse: 96  Temp: 97.9 F (36.6 C)  TempSrc: Oral  Resp: 16  Height: 5\' 11"  (1.803 m)  Weight: 195 lb 14.4 oz (88.86 kg)  SpO2: 95%    Body mass index is 27.33 kg/(m^2).  Physical Exam  Constitutional: Patient  appears well-developed and well-nourished.  No distress.  HEENT: head atraumatic, normocephalic, pupils equal and reactive to light, neck supple, throat within normal limits Cardiovascular: Normal rate, regular rhythm and normal heart sounds.  No murmur heard. No BLE edema. Pulmonary/Chest: Effort normal and breath sounds normal. No respiratory distress. Abdominal: Soft.  There is no tenderness. Psychiatric: Patient has a normal mood and affect. behavior is normal. Judgment and thought content normal. Neurological: normal cranial nerves, Romberg negative, no nystagmus  PHQ2/9: Depression screen [facility] 2/9 08/03/2015 02/01/2015 08/23/2014  Decreased Interest 0 0 0  Down, Depressed, Hopeless 0 0 0  PHQ - 2 Score 0 0 0     Fall Risk: Fall Risk  08/03/2015 02/01/2015 09/22/2014 08/23/2014  Falls in the past year? No No No No      Functional Status Survey: Is the patient deaf or have difficulty hearing?: No Does the patient have difficulty seeing, even when wearing glasses/contacts?: No Does the patient have difficulty concentrating, remembering, or making decisions?: No Does the patient have difficulty walking or climbing stairs?: No Does the patient have difficulty dressing or bathing?: No Does the patient have difficulty doing errands alone such as visiting a doctor's office or shopping?: No  IPSS Questionnaire (AUA-7): Over the past month.   1)  How often have you had a sensation of not emptying your bladder completely after you finish urinating?  0 - Not at all  2)  How often have you had to urinate again less than two hours after you finished urinating? 0 - Not at all  3)  How often have you found you stopped and started again several times when you urinated?  0 - Not at all  4) How difficult have you found it to postpone urination?  1 - Less than 1 time in 5  5) How often have you had a weak urinary stream?  0 - Not at all  6) How often have you had to push or strain to begin urination?   0 - Not at all  7) How many times did you most typically get up to urinate from the time you went to bed until the time you got up in the morning?  3 - 3 times  Total score:  0-7 mildly symptomatic   8-19 moderately symptomatic   20-35 severely symptomatic    Assessment & Plan  1. Benign essential HTN  bp is at goal, we will decrease dose of Doxazoxin because of his age and balance problems , monitor bp at home and we will adjust dose of Lisinopril if needed  2. Paroxysmal atrial fibrillation (HCC)  - dabigatran (PRADAXA) 150 MG CAPS capsule; Take 1 capsule (150 mg total) by mouth 2 (two) times daily.  Dispense: 60 capsule; Refill: 5  3. Hypercholesteremia  Continue medication   4. History of DVT of lower extremity  resolved  5. BPH (benign prostatic hyperplasia)  Low AUA score, needs to avoid sodas since it seems to be his trigger. We will decrease Cardura and monitor, goal is to stop taking Cardura by next visit  6. Memory changes  Wife states he is doing well now  7. Balance problems  Discussed PT, neurological referral but they would like to hold off, we will decrease Cardura and try to stop it before next visit

## 2015-08-03 NOTE — Patient Instructions (Signed)
We will decrease doxazosin to 2 mg daily and after that he will try taking half and eventually stop prior to his next visit

## 2015-08-22 ENCOUNTER — Other Ambulatory Visit: Payer: Self-pay | Admitting: Family Medicine

## 2015-08-25 NOTE — Telephone Encounter (Signed)
Patient stated that [facility] on [address] needs our office to call regarding his prescription Tradaxa but they will fill the rx.  Please call patient once complete.

## 2015-08-28 ENCOUNTER — Telehealth: Payer: Self-pay | Admitting: Emergency Medicine

## 2015-08-28 MED ORDER — DABIGATRAN ETEXILATE MESYLATE 150 MG PO CAPS: 150.0000 mg | ORAL_CAPSULE | Freq: Two times a day (BID) | ORAL | Status: DC

## 2015-08-28 NOTE — Telephone Encounter (Signed)
Script sent  

## 2015-09-01 ENCOUNTER — Ambulatory Visit
Admission: RE | Admit: 2015-09-01 | Discharge: 2015-09-01 | Disposition: A | Discharge status: Home / Self Care | Payer: Self-pay | Source: Ambulatory Visit | Attending: Family Medicine | Admitting: Family Medicine

## 2015-09-01 ENCOUNTER — Encounter: Payer: Self-pay | Admitting: Family Medicine

## 2015-09-01 ENCOUNTER — Ambulatory Visit (INDEPENDENT_AMBULATORY_CARE_PROVIDER_SITE_OTHER): Payer: Commercial Managed Care - HMO | Admitting: Family Medicine

## 2015-09-01 VITALS — BP 134/76 | HR 79 | Temp 98.8°F | Resp 18 | Ht 71.0 in | Wt 192.7 lb

## 2015-09-01 DIAGNOSIS — I10 Essential (primary) hypertension: Secondary | ICD-10-CM

## 2015-09-01 DIAGNOSIS — R059 Cough, unspecified: Secondary | ICD-10-CM

## 2015-09-01 DIAGNOSIS — R05 Cough: Secondary | ICD-10-CM | POA: Insufficient documentation

## 2015-09-01 LAB — CBC
HEMATOCRIT: 42.5 % (ref 38.5–50.0)
Hemoglobin: 14.1 g/dL (ref 13.2–17.1)
MCH: 32 pg (ref 27.0–33.0)
MCHC: 33.2 g/dL (ref 32.0–36.0)
MCV: 96.6 fL (ref 80.0–100.0)
MPV: 11.8 fL (ref 7.5–12.5)
Platelets: 168 10*3/uL (ref 140–400)
RBC: 4.4 MIL/uL (ref 4.20–5.80)
RDW: 12.4 % (ref 11.0–15.0)
WBC: 11.1 10*3/uL — AB (ref 3.8–10.8)

## 2015-09-01 MED ORDER — VALSARTAN 160 MG PO TABS: 160.0000 mg | ORAL_TABLET | Freq: Every day | ORAL | Status: DC

## 2015-09-01 MED ORDER — GUAIFENESIN ER 600 MG PO TB12: 600.0000 mg | ORAL_TABLET | Freq: Two times a day (BID) | ORAL | Status: DC

## 2015-09-01 MED ORDER — AMOXICILLIN-POT CLAVULANATE 875-125 MG PO TABS: 1.0000 | ORAL_TABLET | Freq: Two times a day (BID) | ORAL | Status: DC

## 2015-09-01 NOTE — Progress Notes (Signed)
Name: Ronald Fleming   MRN: [id number]    DOB: October 24, 1935   Date:09/01/2015       Progress Note  Subjective  Chief Complaint  Chief Complaint  Patient presents with  . Cough    HPI  Cough: symptoms started 2 weeks ago, initially had some nasal congestion but no other URI symptoms, since than he has noticd a productive cough with green sputum, subjective fever and sweating at night. Wife is concerned it may be ace. He denies change in appetite, SOB, or wheezing. No fatigue, nausea or vomiting. Wife feels like she starting to get a cold now.   HTN: trying to wean off Cardura, wife would like to have him changed from ACE to ARB because of his cough  Patient Active Problem List   Diagnosis Date Noted  . History of DVT of lower extremity 08/03/2015  . BPH (benign prostatic hyperplasia) 08/03/2015  . Atrial fibrillation ([place]) 02/01/2015  . Benign paroxysmal positional nystagmus 08/23/2014  . Edema leg 08/23/2014  . Chronic kidney disease, stage III (moderate) 08/23/2014  . Bad memory 08/23/2014  . Neuropathy ([place]) 08/23/2014  . Benign essential HTN 08/27/2006  . Hypercholesterolemia without hypertriglyceridemia 08/27/2006    Past Surgical History  Procedure Laterality Date  . No past surgeries      Family History  Problem Relation Age of Onset  . Coronary artery disease Father   . Kidney disease Neg Hx   . Prostate cancer Neg Hx   . Bladder Cancer Neg Hx     Social History   Social History  . Marital Status: Married    Spouse Name: N/A  . Number of Children: N/A  . Years of Education: N/A   Occupational History  . Not on file.   Social History Main Topics  . Smoking status: Never Smoker   . Smokeless tobacco: Never Used  . Alcohol Use: No  . Drug Use: No  . Sexual Activity:    Partners: Female   Other Topics Concern  . Not on file   Social History Narrative     Current outpatient prescriptions:  .  aspirin 81 MG tablet, Take 81 mg by mouth daily., Disp:  , Rfl:  .  dabigatran (PRADAXA) 150 MG CAPS capsule, Take 1 capsule (150 mg total) by mouth 2 (two) times daily., Disp: 60 capsule, Rfl: 0 .  doxazosin (CARDURA) 2 MG tablet, Take 2 tablets (4 mg total) by mouth daily. (Patient taking differently: Take 2 mg by mouth daily. ), Disp: 90 tablet, Rfl: 1 .  lovastatin (MEVACOR) 20 MG tablet, Take 1 tablet (20 mg total) by mouth daily., Disp: 90 tablet, Rfl: 1 .  amoxicillin-clavulanate (AUGMENTIN) 875-125 MG tablet, Take 1 tablet by mouth 2 (two) times daily., Disp: 14 tablet, Rfl: 0 .  guaiFENesin (MUCINEX) 600 MG 12 hr tablet, Take 1 tablet (600 mg total) by mouth 2 (two) times daily., Disp: 40 tablet, Rfl: 0 .  valsartan (DIOVAN) 160 MG tablet, Take 1 tablet (160 mg total) by mouth daily. For bp in place of Lisinopril, Disp: 30 tablet, Rfl: 1  No Known Allergies   ROS  Ten systems reviewed and is negative except as mentioned in HPI  Objective  Filed Vitals:   09/01/15 1408  BP: 134/76  Pulse: 79  Temp: 98.8 F (37.1 C)  TempSrc: Oral  Resp: 18  Height: 5\' 11"  (1.803 m)  Weight: 192 lb 11.2 oz (87.408 kg)  SpO2: 96%    Body mass index  is 26.89 kg/(m^2).  Physical Exam   Constitutional: Patient appears well-developed and well-nourished.  No distress.  HEENT: head atraumatic, normocephalic, pupils equal and reactive to light,  neck supple, throat within normal limits Cardiovascular: Irregular rate and  rhythm , normal heart sounds.  No murmur heard. No BLE edema. Pulmonary/Chest: Effort normal and breath sounds normal. No respiratory distress. Abdominal: Soft.  There is no tenderness. Psychiatric: Patient has a normal mood and affect. behavior is normal. Judgment and thought content normal.  PHQ2/9: Depression screen [facility] 2/9 08/03/2015 02/01/2015 08/23/2014  Decreased Interest 0 0 0  Down, Depressed, Hopeless 0 0 0  PHQ - 2 Score 0 0 0    Fall Risk: Fall Risk  08/03/2015 02/01/2015 09/22/2014 08/23/2014  Falls in the past year?  No No No No    Assessment & Plan  1. Cough  - DG Chest 2 View; Future - CBC - amoxicillin-clavulanate (AUGMENTIN) 875-125 MG tablet; Take 1 tablet by mouth 2 (two) times daily.  Dispense: 14 tablet; Refill: 0 - guaiFENesin (MUCINEX) 600 MG 12 hr tablet; Take 1 tablet (600 mg total) by mouth 2 (two) times daily.  Dispense: 40 tablet; Refill: 0 Possible CAP, we will start antibiotics, Levaquin is Tier 5 we will try Augmentin  2. Benign essential HTN  - valsartan (DIOVAN) 160 MG tablet; Take 1 tablet (160 mg total) by mouth daily. For bp in place of Lisinopril  Dispense: 30 tablet; Refill: 1

## 2015-09-22 ENCOUNTER — Ambulatory Visit (INDEPENDENT_AMBULATORY_CARE_PROVIDER_SITE_OTHER): Payer: Commercial Managed Care - HMO | Admitting: Family Medicine

## 2015-09-22 ENCOUNTER — Encounter: Payer: Self-pay | Admitting: Family Medicine

## 2015-09-22 VITALS — BP 116/74 | HR 78 | Temp 98.6°F | Resp 15 | Wt 191.3 lb

## 2015-09-22 DIAGNOSIS — N400 Enlarged prostate without lower urinary tract symptoms: Secondary | ICD-10-CM | POA: Diagnosis not present

## 2015-09-22 DIAGNOSIS — R059 Cough, unspecified: Secondary | ICD-10-CM

## 2015-09-22 DIAGNOSIS — I10 Essential (primary) hypertension: Secondary | ICD-10-CM

## 2015-09-22 DIAGNOSIS — R05 Cough: Secondary | ICD-10-CM

## 2015-09-22 MED ORDER — VALSARTAN 80 MG PO TABS: 80.0000 mg | ORAL_TABLET | Freq: Every day | ORAL | Status: DC

## 2015-09-22 MED ORDER — BENZONATATE 100 MG PO CAPS
100.0000 mg | ORAL_CAPSULE | Freq: Two times a day (BID) | ORAL | Status: DC | PRN
Start: 2015-09-22 — End: 2016-02-12

## 2015-09-22 MED ORDER — FLUTICASONE FUROATE-VILANTEROL 100-25 MCG/INH IN AEPB: 1.0000 | INHALATION_SPRAY | Freq: Every day | RESPIRATORY_TRACT | Status: DC

## 2015-09-22 MED ORDER — TAMSULOSIN HCL 0.4 MG PO CAPS: 0.4000 mg | ORAL_CAPSULE | Freq: Every day | ORAL | Status: DC

## 2015-09-22 NOTE — Progress Notes (Signed)
Name: Ronald Fleming   MRN: [id number]    DOB: [birth year]   Date:09/22/2015       Progress Note  Subjective  Chief Complaint  Chief Complaint  Patient presents with  . Follow-up  . Cough    patient stated that it is mostly dry for about a week or so.    HPI  HTN: we switched from Lisinopril to Diovan 160 mg on his last visit, his bp today is towards low end of normal, he denies dizziness, chest pain or palpitation.   Cough: he had elevated of WBC but normal CXR, he still has a dry cough - no longer productive, but no SOB. He also denies fever, fatigue and appetite is normal   BPH: he was on Cardura for many years, we stopped medication on his last visit because of his age. He has noticed weak stream since. AUA was 14 today and he would like to try Flomax  IPSS Questionnaire (AUA-7): Over the past month.   1)  How often have you had a sensation of not emptying your bladder completely after you finish urinating?  1 - Less than 1 time in 5  2)  How often have you had to urinate again less than two hours after you finished urinating? 0 - Not at all  3)  How often have you found you stopped and started again several times when you urinated?  1 - Less than 1 time in 5  4) How difficult have you found it to postpone urination?  4 - More than half the time  5) How often have you had a weak urinary stream?  5 - Almost always  6) How often have you had to push or strain to begin urination?  0 - Not at all  7) How many times did you most typically get up to urinate from the time you went to bed until the time you got up in the morning?  3 - 3 times  Total score:  0-7 mildly symptomatic   8-19 moderately symptomatic   20-35 severely symptomatic    Patient Active Problem List   Diagnosis Date Noted  . History of DVT of lower extremity 08/03/2015  . BPH (benign prostatic hyperplasia) 08/03/2015  . Atrial fibrillation ([place]) 02/01/2015  . Benign paroxysmal positional nystagmus 08/23/2014  .  Edema leg 08/23/2014  . Chronic kidney disease, stage III (moderate) 08/23/2014  . Bad memory 08/23/2014  . Neuropathy ([place]) 08/23/2014  . Benign essential HTN 08/27/2006  . Hypercholesterolemia without hypertriglyceridemia 08/27/2006    Past Surgical History  Procedure Laterality Date  . No past surgeries      Family History  Problem Relation Age of Onset  . Coronary artery disease Father   . Kidney disease Neg Hx   . Prostate cancer Neg Hx   . Bladder Cancer Neg Hx     Social History   Social History  . Marital Status: Married    Spouse Name: N/A  . Number of Children: N/A  . Years of Education: N/A   Occupational History  . Not on file.   Social History Main Topics  . Smoking status: Never Smoker   . Smokeless tobacco: Never Used  . Alcohol Use: No  . Drug Use: No  . Sexual Activity:    Partners: Female   Other Topics Concern  . Not on file   Social History Narrative     Current outpatient prescriptions:  .  aspirin 81 MG tablet,  Take 81 mg by mouth daily., Disp: , Rfl:  .  dabigatran (PRADAXA) 150 MG CAPS capsule, Take 1 capsule (150 mg total) by mouth 2 (two) times daily., Disp: 60 capsule, Rfl: 0 .  lovastatin (MEVACOR) 20 MG tablet, Take 1 tablet (20 mg total) by mouth daily., Disp: 90 tablet, Rfl: 1 .  valsartan (DIOVAN) 80 MG tablet, Take 1 tablet (80 mg total) by mouth daily. For bp in place of Lisinopril, Disp: 90 tablet, Rfl: 0  No Known Allergies   ROS  Ten systems reviewed and is negative except as mentioned in HPI   Objective  Filed Vitals:   09/22/15 1038  BP: 116/74  Pulse: 78  Temp: 98.6 F (37 C)  TempSrc: Oral  Resp: 15  Weight: 191 lb 4.8 oz (86.773 kg)  SpO2: 97%    Body mass index is 26.69 kg/(m^2).  Physical Exam  Constitutional: Patient appears well-developed and well-nourished. Obese No distress.  HEENT: head atraumatic, normocephalic, pupils equal and reactive to light,neck supple, throat within normal  limits Cardiovascular: Normal rate, regular rhythm and normal heart sounds.  No murmur heard. No BLE edema. Pulmonary/Chest: Effort normal and breath sounds normal. No respiratory distress. Abdominal: Soft.  There is no tenderness. Psychiatric: Patient has a normal mood and affect. behavior is normal. Judgment and thought content normal.  Recent Results (from the past 2160 hour(s))  CBC     Status: Abnormal   Collection Time: 09/01/15  2:53 PM  Result Value Ref Range   WBC 11.1 (H) 3.8 - 10.8 K/uL   RBC 4.40 4.20 - 5.80 MIL/uL   Hemoglobin 14.1 13.2 - 17.1 g/dL   HCT 42.5 38.5 - 50.0 %   MCV 96.6 80.0 - 100.0 fL   MCH 32.0 27.0 - 33.0 pg   MCHC 33.2 32.0 - 36.0 g/dL   RDW 12.4 11.0 - 15.0 %   Platelets 168 140 - 400 K/uL   MPV 11.8 7.5 - 12.5 fL    Comment: ** Please note change in unit of measure and reference range(s). **      PHQ2/9: Depression screen [facility] 2/9 09/22/2015 08/03/2015 02/01/2015 08/23/2014  Decreased Interest 0 0 0 0  Down, Depressed, Hopeless 0 0 0 0  PHQ - 2 Score 0 0 0 0    Fall Risk: Fall Risk  09/22/2015 08/03/2015 02/01/2015 09/22/2014 08/23/2014  Falls in the past year? No No No No No    Functional Status Survey: Is the patient deaf or have difficulty hearing?: No Does the patient have difficulty seeing, even when wearing glasses/contacts?: No Does the patient have difficulty concentrating, remembering, or making decisions?: No Does the patient have difficulty walking or climbing stairs?: No Does the patient have difficulty dressing or bathing?: No Does the patient have difficulty doing errands alone such as visiting a doctor's office or shopping?: No    Assessment & Plan  1. Benign essential HTN  We will decrease dose to 80 mg since bp is towards low end of normal  - valsartan (DIOVAN) 80 MG tablet; Take 1 tablet (80 mg total) by mouth daily. For bp in place of Lisinopril  Dispense: 90 tablet; Refill: 0  2. Cough  Doing much better now, but still  has a nocturnal cough, likely post-bronchial now, we will try Breo and also tessalon, advised to call back for another follow up if no resolution with medication  - benzonatate (TESSALON) 100 MG capsule; Take 1-2 capsules (100-200 mg total) by mouth 2 (two)  times daily as needed.  Dispense: 40 capsule; Refill: 0 - fluticasone furoate-vilanterol (BREO ELLIPTA) 100-25 MCG/INH AEPB; Inhale 1 puff into the lungs daily.  Dispense: 60 each; Refill: 0   3. BPH (benign prostatic hyperplasia)  We will try Flomax - tamsulosin (FLOMAX) 0.4 MG CAPS capsule; Take 1 capsule (0.4 mg total) by mouth daily.  Dispense: 90 capsule; Refill: 0

## 2015-12-19 ENCOUNTER — Other Ambulatory Visit: Payer: Self-pay | Admitting: Family Medicine

## 2015-12-19 DIAGNOSIS — I10 Essential (primary) hypertension: Secondary | ICD-10-CM

## 2015-12-19 NOTE — Telephone Encounter (Signed)
Patient requesting refill of Diovan to [facility].

## 2015-12-29 ENCOUNTER — Ambulatory Visit (INDEPENDENT_AMBULATORY_CARE_PROVIDER_SITE_OTHER): Payer: Commercial Managed Care - HMO

## 2015-12-29 DIAGNOSIS — Z23 Encounter for immunization: Secondary | ICD-10-CM

## 2016-02-06 ENCOUNTER — Encounter: Payer: Commercial Managed Care - HMO | Admitting: Family Medicine

## 2016-02-12 ENCOUNTER — Encounter: Payer: Self-pay | Admitting: Family Medicine

## 2016-02-12 ENCOUNTER — Ambulatory Visit (INDEPENDENT_AMBULATORY_CARE_PROVIDER_SITE_OTHER): Payer: Commercial Managed Care - HMO | Admitting: Family Medicine

## 2016-02-12 VITALS — BP 124/72 | HR 78 | Temp 97.9°F | Resp 16 | Ht 69.5 in | Wt 194.2 lb

## 2016-02-12 DIAGNOSIS — Z0001 Encounter for general adult medical examination with abnormal findings: Secondary | ICD-10-CM

## 2016-02-12 DIAGNOSIS — R972 Elevated prostate specific antigen [PSA]: Secondary | ICD-10-CM | POA: Diagnosis not present

## 2016-02-12 DIAGNOSIS — R2689 Other abnormalities of gait and mobility: Secondary | ICD-10-CM

## 2016-02-12 DIAGNOSIS — E785 Hyperlipidemia, unspecified: Secondary | ICD-10-CM | POA: Diagnosis not present

## 2016-02-12 DIAGNOSIS — N400 Enlarged prostate without lower urinary tract symptoms: Secondary | ICD-10-CM

## 2016-02-12 DIAGNOSIS — Z0000 Encounter for general adult medical examination without abnormal findings: Secondary | ICD-10-CM

## 2016-02-12 DIAGNOSIS — I10 Essential (primary) hypertension: Secondary | ICD-10-CM | POA: Diagnosis not present

## 2016-02-12 DIAGNOSIS — L80 Vitiligo: Secondary | ICD-10-CM | POA: Diagnosis not present

## 2016-02-12 DIAGNOSIS — R413 Other amnesia: Secondary | ICD-10-CM

## 2016-02-12 DIAGNOSIS — R739 Hyperglycemia, unspecified: Secondary | ICD-10-CM

## 2016-02-12 DIAGNOSIS — I480 Paroxysmal atrial fibrillation: Secondary | ICD-10-CM

## 2016-02-12 LAB — COMPLETE METABOLIC PANEL WITH GFR
ALBUMIN: 4.1 g/dL (ref 3.6–5.1)
ALK PHOS: 63 U/L (ref 40–115)
ALT: 16 U/L (ref 9–46)
AST: 24 U/L (ref 10–35)
BILIRUBIN TOTAL: 0.6 mg/dL (ref 0.2–1.2)
BUN: 18 mg/dL (ref 7–25)
CO2: 27 mmol/L (ref 20–31)
CREATININE: 1.21 mg/dL — AB (ref 0.70–1.18)
Calcium: 9.1 mg/dL (ref 8.6–10.3)
Chloride: 106 mmol/L (ref 98–110)
GFR, Est African American: 66 mL/min (ref >=60)
GFR, Est Non African American: 57 mL/min — ABNORMAL LOW (ref >=60)
GLUCOSE: 96 mg/dL (ref 65–99)
Potassium: 4.2 mmol/L (ref 3.5–5.3)
SODIUM: 142 mmol/L (ref 135–146)
TOTAL PROTEIN: 7.2 g/dL (ref 6.1–8.1)

## 2016-02-12 LAB — CBC WITH DIFFERENTIAL/PLATELET
BASOS ABS: 0 {cells}/uL (ref 0–200)
Basophils Relative: 0 %
EOS ABS: 66 {cells}/uL (ref 15–500)
EOS PCT: 1 %
HCT: 46.8 % (ref 38.5–50.0)
HEMOGLOBIN: 15.4 g/dL (ref 13.2–17.1)
Lymphocytes Relative: 19 %
Lymphs Abs: 1254 cells/uL (ref 850–3900)
MCH: 31.7 pg (ref 27.0–33.0)
MCHC: 32.9 g/dL (ref 32.0–36.0)
MCV: 96.3 fL (ref 80.0–100.0)
MONOS PCT: 6 %
MPV: 11.2 fL (ref 7.5–12.5)
Monocytes Absolute: 396 cells/uL (ref 200–950)
NEUTROS PCT: 74 %
Neutro Abs: 4884 cells/uL (ref 1500–7800)
PLATELETS: 175 10*3/uL (ref 140–400)
RBC: 4.86 MIL/uL (ref 4.20–5.80)
RDW: 12.6 % (ref 11.0–15.0)
WBC: 6.6 10*3/uL (ref 3.8–10.8)

## 2016-02-12 LAB — LIPID PANEL
CHOL/HDL RATIO: 3.3 ratio (ref <5.0)
CHOLESTEROL: 202 mg/dL — AB (ref <200)
HDL: 62 mg/dL (ref >40)
LDL Cholesterol: 121 mg/dL — ABNORMAL HIGH (ref <100)
Triglycerides: 97 mg/dL (ref <150)
VLDL: 19 mg/dL (ref <30)

## 2016-02-12 LAB — HEMOGLOBIN A1C
Hgb A1c MFr Bld: 5.6 % (ref <5.7)
MEAN PLASMA GLUCOSE: 114 mg/dL

## 2016-02-12 MED ORDER — VALSARTAN 80 MG PO TABS
ORAL_TABLET | ORAL | 1 refills | Status: DC
Start: 2016-02-12 — End: 2016-05-29

## 2016-02-12 MED ORDER — TAMSULOSIN HCL 0.4 MG PO CAPS: 0.4000 mg | ORAL_CAPSULE | Freq: Every day | ORAL | 1 refills | Status: DC

## 2016-02-12 MED ORDER — LOVASTATIN 20 MG PO TABS: 20.0000 mg | ORAL_TABLET | Freq: Every day | ORAL | 1 refills | Status: DC

## 2016-02-12 NOTE — Progress Notes (Signed)
Name: Ronald Fleming   MRN: [id number]    DOB: [birth year]   Date:02/12/2016       Progress Note  Subjective  Chief Complaint  Chief Complaint  Patient presents with  . Annual Exam    HPI  Functional ability/safety issues: No Issues Hearing issues: Addressed - wife is worried, but patient denies problems Activities of daily living: Discussed Home safety issues: No Issues  End Of Life Planning: Offered verbal information regarding advanced directives, healthcare power of attorney.  Preventative care, Health maintenance, Preventative health measures discussed.  Preventative screenings discussed today: lab work, colonoscopy, PSA.  Men age 66 to 16 years if ever smoked recommended to get a one time AAA ultrasound screening exam.  Low Dose CT Chest recommended if Age 79-80 years, 30 pack-year currently smoking OR have quit w/in 15years.   Lifestyle risk factor issued reviewed: Diet, exercise, weight management, advised patient smoking is not healthy, nutrition/diet.  Preventative health measures discussed (5-10 year plan).  Reviewed and recommended vaccinations: - Pneumovax  - Prevnar  - Annual Influenza - Zostavax - Tdap   Depression screening: Done Fall risk screening: Done Discuss ADLs/IADLs: Done  Current medical providers: See HPI  Other health risk factors identified this visit: No other issues Cognitive impairment issues: None identified  All above discussed with patient. Appropriate education, counseling and referral will be made based upon the above.   BPH: he is taking Tamsulosin, off doxazosin. He still has nocturia - he goes to bed early ( he sleeps about 12 hours per night )  HTN: taking medication and denies side effects, no chest pain he has occasional palpitation  Afib: taking Pradaxa, occasionally has palpitation, tolerating medication well, no bleeding  Gait instability: he states he feels off balance, staggering, no dizziness or spinning sensation,  no joint problems. Discussed PT referral and he is willing to go  Dyslipidemia: taking Lovastatin and denies side effects  IPSS Questionnaire (AUA-7): Over the past month.   1)  How often have you had a sensation of not emptying your bladder completely after you finish urinating?  1 - Less than 1 time in 5  2)  How often have you had to urinate again less than two hours after you finished urinating? 1 - Less than 1 time in 5  3)  How often have you found you stopped and started again several times when you urinated?  0 - Not at all  4) How difficult have you found it to postpone urination?  0 - Not at all  5) How often have you had a weak urinary stream?  3 - About half the time  6) How often have you had to push or strain to begin urination?  0 - Not at all  7) How many times did you most typically get up to urinate from the time you went to bed until the time you got up in the morning?  3 - 3 times  Total score:  0-7 mildly symptomatic   8-19 moderately symptomatic   20-35 severely symptomatic    Patient Active Problem List   Diagnosis Date Noted  . History of DVT of lower extremity 08/03/2015  . BPH (benign prostatic hyperplasia) 08/03/2015  . Atrial fibrillation ([place]) 02/01/2015  . Benign paroxysmal positional nystagmus 08/23/2014  . Edema leg 08/23/2014  . Chronic kidney disease, stage III (moderate) 08/23/2014  . Bad memory 08/23/2014  . Neuropathy ([place]) 08/23/2014  . Benign essential HTN 08/27/2006  . Hypercholesterolemia without  hypertriglyceridemia 08/27/2006    Past Surgical History:  Procedure Laterality Date  . NO PAST SURGERIES      Family History  Problem Relation Age of Onset  . Coronary artery disease Father   . Kidney disease Neg Hx   . Prostate cancer Neg Hx   . Bladder Cancer Neg Hx     Social History   Social History  . Marital status: Married    Spouse name: N/A  . Number of children: N/A  . Years of education: N/A   Occupational History  .  Not on file.   Social History Main Topics  . Smoking status: Never Smoker  . Smokeless tobacco: Never Used  . Alcohol use 0.0 oz/week     Comment: occasionally  . Drug use: No  . Sexual activity: Yes    Partners: Female   Other Topics Concern  . Not on file   Social History Narrative  . No narrative on file     Current Outpatient Prescriptions:  .  aspirin 81 MG tablet, Take 81 mg by mouth daily., Disp: , Rfl:  .  dabigatran (PRADAXA) 150 MG CAPS capsule, Take 1 capsule (150 mg total) by mouth 2 (two) times daily., Disp: 60 capsule, Rfl: 0 .  lovastatin (MEVACOR) 20 MG tablet, Take 1 tablet (20 mg total) by mouth daily., Disp: 90 tablet, Rfl: 1 .  tamsulosin (FLOMAX) 0.4 MG CAPS capsule, Take 1 capsule (0.4 mg total) by mouth daily., Disp: 90 capsule, Rfl: 1 .  valsartan (DIOVAN) 80 MG tablet, Daily for bp, Disp: 90 tablet, Rfl: 1  No Known Allergies   ROS  Constitutional: Negative for fever or weight change.  Respiratory: Negative for cough and shortness of breath.   Cardiovascular: Negative for chest pain , occasionally has  palpitations.  Gastrointestinal: Negative for abdominal pain, no bowel changes.  Musculoskeletal: Positive  for gait problem but no joint swelling.  Skin: Negative for rash.  Neurological: Negative for dizziness or headache.  No other specific complaints in a complete review of systems (except as listed in HPI above).  Objective  Vitals:   02/12/16 1527  BP: 124/72  Pulse: 78  Resp: 16  Temp: 97.9 F (36.6 C)  TempSrc: Oral  SpO2: 94%  Weight: 194 lb 3.2 oz (88.1 kg)  Height: 5' 9.5" (1.765 m)    Body mass index is 28.27 kg/m.  Physical Exam  Constitutional: Patient appears well-developed and well-nourished. No distress.  HENT: Head: Normocephalic and atraumatic. Ears: B TMs ok, no erythema or effusion; Nose: Nose normal. Mouth/Throat: Oropharynx is clear and moist. No oropharyngeal exudate.  Eyes: Conjunctivae and EOM are normal.  Pupils are equal, round, and reactive to light. No scleral icterus.  Neck: Normal range of motion. Neck supple. No JVD present. No thyromegaly present.  Cardiovascular: Normal rate, regular rhythm and normal heart sounds.  No murmur heard. No BLE edema. Pulmonary/Chest: Effort normal and breath sounds normal. No respiratory distress. Abdominal: Soft. Bowel sounds are normal, no distension. There is no tenderness. no masses MALE GENITALIA: Normal descended testes bilaterally, no masses palpated, no hernias, no lesions, no discharge RECTAL: Prostate enlarged, no rectal masses or hemorrhoids Musculoskeletal: Normal range of motion, no joint effusions. No gross deformities Neurological: he is alert and oriented to person, place, and time. No cranial nerve deficit. Coordination, balance, strength, speech and gait are normal.  Skin: Skin is warm and dry.vitiligo on penis, No erythema.  Psychiatric: Patient has a normal mood and affect. behavior  is normal. Judgment and thought content normal.   PHQ2/9: Depression screen [facility] 2/9 02/12/2016 09/22/2015 08/03/2015 02/01/2015 08/23/2014  Decreased Interest 0 0 0 0 0  Down, Depressed, Hopeless 0 0 0 0 0  PHQ - 2 Score 0 0 0 0 0     Fall Risk: Fall Risk  02/12/2016 09/22/2015 08/03/2015 02/01/2015 09/22/2014  Falls in the past year? No No No No No     Functional Status Survey: Is the patient deaf or have difficulty hearing?: No Does the patient have difficulty seeing, even when wearing glasses/contacts?: No Does the patient have difficulty concentrating, remembering, or making decisions?: No Does the patient have difficulty walking or climbing stairs?: No Does the patient have difficulty dressing or bathing?: No Does the patient have difficulty doing errands alone such as visiting a doctor's office or shopping?: No    Assessment & Plan  1. Medicare annual wellness visit, subsequent  Discussed importance of 150 minutes of physical activity weekly,  eat two servings of fish weekly, eat one serving of tree nuts ( cashews, pistachios, pecans, almonds.Marland Kitchen) every other day, eat 6 servings of fruit/vegetables daily and drink plenty of water and avoid sweet beverages.    2. Benign prostatic hyperplasia without lower urinary tract symptoms  Seen by urologist in the past - tamsulosin (FLOMAX) 0.4 MG CAPS capsule; Take 1 capsule (0.4 mg total) by mouth daily.  Dispense: 90 capsule; Refill: 1 - PSA  3. Benign essential HTN  - valsartan (DIOVAN) 80 MG tablet; Daily for bp  Dispense: 90 tablet; Refill: 1 - COMPLETE METABOLIC PANEL WITH GFR - CBC with Differential/Platelet  4. Dyslipidemia  - lovastatin (MEVACOR) 20 MG tablet; Take 1 tablet (20 mg total) by mouth daily.  Dispense: 90 tablet; Refill: 1 - Lipid panel  5. Elevated PSA  - PSA  6. Hyperglycemia  - Hemoglobin A1c  7. Paroxysmal atrial fibrillation [facility])  Sees cardiologist and has noticed some decreased in exercise tolerance. He will discuss it with Dr. Clayborn Bigness  8. Balance problems  - Ambulatory referral to Physical Therapy  9. Memory changes  - Ambulatory referral to Neurology TSH -B12 Wife states he needs help when driving ( directions ) lately, also balance problems. Discussed possible parkinson's or Alzheimer's

## 2016-02-13 LAB — VITAMIN B12: Vitamin B-12: 346 pg/mL (ref 200–1100)

## 2016-02-13 LAB — TSH: TSH: 1.88 m[IU]/L (ref 0.40–4.50)

## 2016-02-13 LAB — PSA: PSA: 5.1 ng/mL — ABNORMAL HIGH (ref <=4.0)

## 2016-04-21 ENCOUNTER — Other Ambulatory Visit: Payer: Self-pay | Admitting: Family Medicine

## 2016-04-22 NOTE — Telephone Encounter (Signed)
Patient requesting refill of Pradaxa to [facility].  

## 2016-05-21 DIAGNOSIS — E538 Deficiency of other specified B group vitamins: Secondary | ICD-10-CM | POA: Insufficient documentation

## 2016-05-21 DIAGNOSIS — G301 Alzheimer's disease with late onset: Secondary | ICD-10-CM | POA: Diagnosis not present

## 2016-05-21 DIAGNOSIS — I4891 Unspecified atrial fibrillation: Secondary | ICD-10-CM | POA: Diagnosis not present

## 2016-05-21 DIAGNOSIS — F0280 Dementia in other diseases classified elsewhere without behavioral disturbance: Secondary | ICD-10-CM | POA: Insufficient documentation

## 2016-05-29 ENCOUNTER — Encounter: Payer: Self-pay | Admitting: Family Medicine

## 2016-05-29 ENCOUNTER — Ambulatory Visit (INDEPENDENT_AMBULATORY_CARE_PROVIDER_SITE_OTHER): Payer: Medicare PPO | Admitting: Family Medicine

## 2016-05-29 VITALS — BP 132/82 | HR 92 | Temp 97.9°F | Resp 16 | Ht 70.0 in | Wt 209.9 lb

## 2016-05-29 DIAGNOSIS — E538 Deficiency of other specified B group vitamins: Secondary | ICD-10-CM

## 2016-05-29 DIAGNOSIS — N183 Chronic kidney disease, stage 3 unspecified: Secondary | ICD-10-CM

## 2016-05-29 DIAGNOSIS — I480 Paroxysmal atrial fibrillation: Secondary | ICD-10-CM

## 2016-05-29 DIAGNOSIS — E785 Hyperlipidemia, unspecified: Secondary | ICD-10-CM | POA: Diagnosis not present

## 2016-05-29 DIAGNOSIS — G629 Polyneuropathy, unspecified: Secondary | ICD-10-CM

## 2016-05-29 DIAGNOSIS — N400 Enlarged prostate without lower urinary tract symptoms: Secondary | ICD-10-CM

## 2016-05-29 DIAGNOSIS — E669 Obesity, unspecified: Secondary | ICD-10-CM

## 2016-05-29 DIAGNOSIS — F0280 Dementia in other diseases classified elsewhere without behavioral disturbance: Secondary | ICD-10-CM | POA: Diagnosis not present

## 2016-05-29 DIAGNOSIS — I10 Essential (primary) hypertension: Secondary | ICD-10-CM | POA: Diagnosis not present

## 2016-05-29 DIAGNOSIS — I441 Atrioventricular block, second degree: Secondary | ICD-10-CM | POA: Insufficient documentation

## 2016-05-29 DIAGNOSIS — G301 Alzheimer's disease with late onset: Secondary | ICD-10-CM | POA: Diagnosis not present

## 2016-05-29 MED ORDER — TAMSULOSIN HCL 0.4 MG PO CAPS: 0.4000 mg | ORAL_CAPSULE | Freq: Every day | ORAL | 1 refills | Status: DC

## 2016-05-29 MED ORDER — DABIGATRAN ETEXILATE MESYLATE 150 MG PO CAPS: 150.0000 mg | ORAL_CAPSULE | Freq: Two times a day (BID) | ORAL | 5 refills | Status: DC

## 2016-05-29 MED ORDER — ATORVASTATIN CALCIUM 20 MG PO TABS: 20.0000 mg | ORAL_TABLET | Freq: Every day | ORAL | 1 refills | Status: DC

## 2016-05-29 MED ORDER — VALSARTAN 80 MG PO TABS: ORAL_TABLET | ORAL | 1 refills | Status: DC

## 2016-05-29 MED ORDER — LOVASTATIN 20 MG PO TABS: 20.0000 mg | ORAL_TABLET | Freq: Every day | ORAL | 1 refills | Status: DC

## 2016-05-29 NOTE — Progress Notes (Signed)
Name: Ronald Fleming   MRN: [id number]    DOB: 1935/08/20   Date:05/29/2016       Progress Note  Subjective  Chief Complaint  Chief Complaint  Patient presents with  . Medication Refill    4 month F/U  . Hypertension    Denies any symptoms  . Hyperlipidemia    HPI  Dementia: seeing NP at Dr. Trena Platt office, no side effects of medication, no symptoms of paranoia, normal behavior.   BPH: he is taking Tamsulosin, off doxazosin. He still has nocturia - he goes to bed early ( he sleeps about 12 hours per night )  HTN: taking medication and denies side effects, no chest pain , no recent episodes  Afib: taking Pradaxa, no recent episodes of palpitation, tolerating medication well, no bleeding  Gait instability: he states he is feeling better, no longer having problems with balance lately  Dyslipidemia: taking Lovastatin and denies side effects, however LDL has gone up we will change to Atorvastatin, reviewed results with patient.   Obesity: he gained a lot of weight since last visit, he states he is not as active during the winter months.   Neuropathy: doing well, no burning or tingling at this time, taking B12 supplementation   Patient Active Problem List   Diagnosis Date Noted  . Atrioventricular block, second degree 05/29/2016  . Vitamin B12 deficiency 05/21/2016  . Late onset Alzheimer's disease without behavioral disturbance 05/21/2016  . Vitiligo 02/12/2016  . History of DVT of lower extremity 08/03/2015  . BPH (benign prostatic hyperplasia) 08/03/2015  . Atrial fibrillation ([place]) 02/01/2015  . Benign paroxysmal positional nystagmus 08/23/2014  . Edema leg 08/23/2014  . Chronic kidney disease, stage III (moderate) 08/23/2014  . Bad memory 08/23/2014  . Neuropathy ([place]) 08/23/2014  . Benign essential HTN 08/27/2006  . Hypercholesterolemia without hypertriglyceridemia 08/27/2006    Past Surgical History:  Procedure Laterality Date  . NO PAST SURGERIES      Family  History  Problem Relation Age of Onset  . Coronary artery disease Father   . Kidney disease Neg Hx   . Prostate cancer Neg Hx   . Bladder Cancer Neg Hx     Social History   Social History  . Marital status: Married    Spouse name: N/A  . Number of children: N/A  . Years of education: N/A   Occupational History  . Not on file.   Social History Main Topics  . Smoking status: Never Smoker  . Smokeless tobacco: Never Used  . Alcohol use 0.0 oz/week     Comment: occasionally  . Drug use: No  . Sexual activity: Yes    Partners: Female   Other Topics Concern  . Not on file   Social History Narrative   Married for 50 plus years.    They have two children that live in [place] and one son that lives in [place].     Current Outpatient Prescriptions:  .  aspirin 81 MG tablet, Take 81 mg by mouth daily., Disp: , Rfl:  .  dabigatran (PRADAXA) 150 MG CAPS capsule, Take 1 capsule (150 mg total) by mouth 2 (two) times daily., Disp: 60 capsule, Rfl: 5 .  galantamine (RAZADYNE) 4 MG tablet, Take by mouth., Disp: , Rfl:  .  tamsulosin (FLOMAX) 0.4 MG CAPS capsule, Take 1 capsule (0.4 mg total) by mouth daily., Disp: 90 capsule, Rfl: 1 .  valsartan (DIOVAN) 80 MG tablet, Daily for bp, Disp: 90 tablet, Rfl: 1 .  vitamin B-12 (CYANOCOBALAMIN) 1000 MCG tablet, Take by mouth., Disp: , Rfl:  .  atorvastatin (LIPITOR) 20 MG tablet, Take 1 tablet (20 mg total) by mouth daily. In place of Lovastatin for cholesterol, Disp: 90 tablet, Rfl: 1  No Known Allergies   ROS  Constitutional: Negative for fever, positive for  weight change - gain .  Respiratory: Negative for cough and shortness of breath.   Cardiovascular: Negative for chest pain or palpitations.  Gastrointestinal: Negative for abdominal pain, no bowel changes.  Musculoskeletal: Negative for gait problem or joint swelling.  Skin: Negative for rash.  Neurological: Negative for dizziness or headache.  No other specific complaints in a  complete review of systems (except as listed in HPI above).  Objective  Vitals:   05/29/16 0915  BP: 132/82  Pulse: 92  Resp: 16  Temp: 97.9 F (36.6 C)  TempSrc: Oral  SpO2: 98%  Weight: 209 lb 14.4 oz (95.2 kg)  Height: 5\' 10"  (1.778 m)    Body mass index is 30.12 kg/m.  Physical Exam  Constitutional: Patient appears well-developed and well-nourished. Obese. No distress.  HEENT: head atraumatic, normocephalic, pupils equal and reactive to light, neck supple, throat within normal limits Cardiovascular: Normal rate, regular rhythm and normal heart sounds.  No murmur heard. 1 plus  BLE edema. Pulmonary/Chest: Effort normal and breath sounds normal. No respiratory distress. Abdominal: Soft.  There is no tenderness. Psychiatric: Patient has a normal mood and affect. behavior is normal. Judgment and thought content normal.  PHQ2/9: Depression screen [facility] 2/9 05/29/2016 02/12/2016 09/22/2015 08/03/2015 02/01/2015  Decreased Interest 0 0 0 0 0  Down, Depressed, Hopeless 0 0 0 0 0  PHQ - 2 Score 0 0 0 0 0     Fall Risk: Fall Risk  05/29/2016 02/12/2016 09/22/2015 08/03/2015 02/01/2015  Falls in the past year? No No No No No     Assessment & Plan  1. Benign essential HTN  - valsartan (DIOVAN) 80 MG tablet; Daily for bp  Dispense: 90 tablet; Refill: 1  2. Dyslipidemia  - atorvastatin (LIPITOR) 20 MG tablet; Take 1 tablet (20 mg total) by mouth daily. In place of Lovastatin for cholesterol  Dispense: 90 tablet; Refill: 1  3. Paroxysmal atrial fibrillation (HCC)  - dabigatran (PRADAXA) 150 MG CAPS capsule; Take 1 capsule (150 mg total) by mouth 2 (two) times daily.  Dispense: 60 capsule; Refill: 5  4. Chronic kidney disease, stage III (moderate)  Improved, last GFR back to normal, we will continue monitoring   5. Late onset Alzheimer's disease without behavioral disturbance  He is seeing neurologist , taking medication mostly to prevent progression  6. Vitamin B12  deficiency  Continue supplementation   7. Neuropathy ([place])  Doing well now, may have been secondary to low B12  8. Benign prostatic hyperplasia without lower urinary tract symptoms  - tamsulosin (FLOMAX) 0.4 MG CAPS capsule; Take 1 capsule (0.4 mg total) by mouth daily.  Dispense: 90 capsule; Refill: 1

## 2016-07-26 ENCOUNTER — Other Ambulatory Visit: Payer: Self-pay | Admitting: Family Medicine

## 2016-07-30 DIAGNOSIS — E785 Hyperlipidemia, unspecified: Secondary | ICD-10-CM | POA: Diagnosis not present

## 2016-07-30 DIAGNOSIS — R413 Other amnesia: Secondary | ICD-10-CM | POA: Diagnosis not present

## 2016-07-30 DIAGNOSIS — N400 Enlarged prostate without lower urinary tract symptoms: Secondary | ICD-10-CM | POA: Diagnosis not present

## 2016-07-30 DIAGNOSIS — I129 Hypertensive chronic kidney disease with stage 1 through stage 4 chronic kidney disease, or unspecified chronic kidney disease: Secondary | ICD-10-CM | POA: Diagnosis not present

## 2016-07-30 DIAGNOSIS — Z7901 Long term (current) use of anticoagulants: Secondary | ICD-10-CM | POA: Diagnosis not present

## 2016-07-30 DIAGNOSIS — N182 Chronic kidney disease, stage 2 (mild): Secondary | ICD-10-CM | POA: Diagnosis not present

## 2016-07-30 DIAGNOSIS — I4891 Unspecified atrial fibrillation: Secondary | ICD-10-CM | POA: Diagnosis not present

## 2016-07-30 DIAGNOSIS — Z6827 Body mass index (BMI) 27.0-27.9, adult: Secondary | ICD-10-CM | POA: Diagnosis not present

## 2016-07-30 DIAGNOSIS — E663 Overweight: Secondary | ICD-10-CM | POA: Diagnosis not present

## 2016-09-10 ENCOUNTER — Encounter: Payer: Self-pay | Admitting: Family Medicine

## 2016-09-10 ENCOUNTER — Ambulatory Visit (INDEPENDENT_AMBULATORY_CARE_PROVIDER_SITE_OTHER): Payer: Medicare PPO | Admitting: Family Medicine

## 2016-09-10 VITALS — BP 126/68 | HR 73 | Temp 98.1°F | Resp 16 | Ht 70.0 in | Wt 195.2 lb

## 2016-09-10 DIAGNOSIS — E785 Hyperlipidemia, unspecified: Secondary | ICD-10-CM

## 2016-09-10 DIAGNOSIS — N400 Enlarged prostate without lower urinary tract symptoms: Secondary | ICD-10-CM | POA: Diagnosis not present

## 2016-09-10 DIAGNOSIS — G301 Alzheimer's disease with late onset: Secondary | ICD-10-CM

## 2016-09-10 DIAGNOSIS — R634 Abnormal weight loss: Secondary | ICD-10-CM

## 2016-09-10 DIAGNOSIS — E538 Deficiency of other specified B group vitamins: Secondary | ICD-10-CM

## 2016-09-10 DIAGNOSIS — R972 Elevated prostate specific antigen [PSA]: Secondary | ICD-10-CM | POA: Diagnosis not present

## 2016-09-10 DIAGNOSIS — I10 Essential (primary) hypertension: Secondary | ICD-10-CM

## 2016-09-10 DIAGNOSIS — F0280 Dementia in other diseases classified elsewhere without behavioral disturbance: Secondary | ICD-10-CM | POA: Diagnosis not present

## 2016-09-10 DIAGNOSIS — I480 Paroxysmal atrial fibrillation: Secondary | ICD-10-CM

## 2016-09-10 LAB — CBC WITH DIFFERENTIAL/PLATELET
BASOS ABS: 0 {cells}/uL (ref 0–200)
Basophils Relative: 0 %
EOS ABS: 63 {cells}/uL (ref 15–500)
Eosinophils Relative: 1 %
HEMATOCRIT: 45.4 % (ref 38.5–50.0)
HEMOGLOBIN: 15 g/dL (ref 13.2–17.1)
LYMPHS ABS: 1764 {cells}/uL (ref 850–3900)
LYMPHS PCT: 28 %
MCH: 32.1 pg (ref 27.0–33.0)
MCHC: 33 g/dL (ref 32.0–36.0)
MCV: 97.2 fL (ref 80.0–100.0)
MONO ABS: 315 {cells}/uL (ref 200–950)
MPV: 10.7 fL (ref 7.5–12.5)
Monocytes Relative: 5 %
NEUTROS PCT: 66 %
Neutro Abs: 4158 cells/uL (ref 1500–7800)
Platelets: 177 10*3/uL (ref 140–400)
RBC: 4.67 MIL/uL (ref 4.20–5.80)
RDW: 12.9 % (ref 11.0–15.0)
WBC: 6.3 10*3/uL (ref 3.8–10.8)

## 2016-09-10 LAB — TSH: TSH: 1.21 mIU/L (ref 0.40–4.50)

## 2016-09-10 NOTE — Progress Notes (Signed)
Name: Ronald Fleming   MRN: [id number]    DOB: 07-29-1935   Date:09/10/2016       Progress Note  Subjective  Chief Complaint  Chief Complaint  Patient presents with  . paperwork from [facility]    HPI  Reviewed paperwork from [facility], and all the information they requested was already up to date.   HTN: he is still not checking bp at home, but bp has been at goal at our office, no chest pain or palpitation  Hyperlipidemia: he is taking Atorvastatin for the past 6 months and we will recheck labs, he denies myalgias  Alzheimer's late onset: wife noticed he had memory loss, seen by neurologist and is now on Razadyne 4mg  twice daily ( Dr. Manuella Ghazi), he has been on medication over 6 months, he has lost weight, but states no side effect of medication, and does not forget to eat. He lost 15 lbs since last visit.   BPH /elevated PSA: last visit with Dr. Bernardo Heater years ago, we will send him back, especially with new onset weight loss.   Afib: doing well, no SOB or palpitation, on Pradaxa and denies easy bleeding  Patient Active Problem List   Diagnosis Date Noted  . Atrioventricular block, second degree 05/29/2016  . Vitamin B12 deficiency 05/21/2016  . Late onset Alzheimer's disease without behavioral disturbance 05/21/2016  . Vitiligo 02/12/2016  . History of DVT of lower extremity 08/03/2015  . BPH (benign prostatic hyperplasia) 08/03/2015  . Atrial fibrillation ([place]) 02/01/2015  . Benign paroxysmal positional nystagmus 08/23/2014  . Edema leg 08/23/2014  . Chronic kidney disease, stage III (moderate) 08/23/2014  . Bad memory 08/23/2014  . Neuropathy 08/23/2014  . Benign essential HTN 08/27/2006  . Hypercholesterolemia without hypertriglyceridemia 08/27/2006    Past Surgical History:  Procedure Laterality Date  . NO PAST SURGERIES      Family History  Problem Relation Age of Onset  . Coronary artery disease Father   . Kidney disease Neg Hx   . Prostate cancer Neg Hx   . Bladder  Cancer Neg Hx     Social History   Social History  . Marital status: Married    Spouse name: N/A  . Number of children: N/A  . Years of education: N/A   Occupational History  . Not on file.   Social History Main Topics  . Smoking status: Never Smoker  . Smokeless tobacco: Never Used  . Alcohol use 0.0 oz/week     Comment: occasionally  . Drug use: No  . Sexual activity: Yes    Partners: Female   Other Topics Concern  . Not on file   Social History Narrative   Married for 50 plus years.    They have two children that live in [place] and one son that lives in [place].     Current Outpatient Prescriptions:  .  aspirin 81 MG tablet, Take 81 mg by mouth daily., Disp: , Rfl:  .  atorvastatin (LIPITOR) 20 MG tablet, Take 1 tablet (20 mg total) by mouth daily. In place of Lovastatin for cholesterol, Disp: 90 tablet, Rfl: 1 .  dabigatran (PRADAXA) 150 MG CAPS capsule, Take 1 capsule (150 mg total) by mouth 2 (two) times daily., Disp: 60 capsule, Rfl: 5 .  galantamine (RAZADYNE) 4 MG tablet, Take 4 mg by mouth 2 (two) times daily., Disp: , Rfl:  .  tamsulosin (FLOMAX) 0.4 MG CAPS capsule, Take 1 capsule (0.4 mg total) by mouth daily., Disp: 90 capsule, Rfl:  1 .  valsartan (DIOVAN) 80 MG tablet, Daily for bp, Disp: 90 tablet, Rfl: 1 .  vitamin B-12 (CYANOCOBALAMIN) 1000 MCG tablet, Take by mouth., Disp: , Rfl:   No Known Allergies   ROS  Constitutional: Negative for fever , positive for weight change.  Respiratory: Negative for cough and shortness of breath.   Cardiovascular: Negative for chest pain or palpitations.  Gastrointestinal: Negative for abdominal pain, no bowel changes.  Musculoskeletal: Negative for gait problem or joint swelling.  Skin: Negative for rash.  Neurological: Negative for dizziness or headache.  No other specific complaints in a complete review of systems (except as listed in HPI above).  Objective  Vitals:   09/10/16 1331  BP: 126/68  Pulse: 73   Resp: 16  Temp: 98.1 F (36.7 C)  SpO2: 95%  Weight: 195 lb 4 oz (88.6 kg)  Height: 5\' 10"  (1.778 m)    Body mass index is 28.02 kg/m.  Physical Exam  Constitutional: Patient appears well-developed and well-nourished. Overweight. No distress.  HEENT: head atraumatic, normocephalic, pupils equal and reactive to light,neck supple, throat within normal limits Cardiovascular: Normal rate, regular rhythm and normal heart sounds.  No murmur heard. No BLE edema. Pulmonary/Chest: Effort normal and breath sounds normal. No respiratory distress. Abdominal: Soft.  There is no tenderness. Psychiatric: Patient has a normal mood and affect. behavior is normal. Judgment and thought content normal.  PHQ2/9: Depression screen [facility] 2/9 05/29/2016 02/12/2016 09/22/2015 08/03/2015 02/01/2015  Decreased Interest 0 0 0 0 0  Down, Depressed, Hopeless 0 0 0 0 0  PHQ - 2 Score 0 0 0 0 0     Fall Risk: Fall Risk  05/29/2016 02/12/2016 09/22/2015 08/03/2015 02/01/2015  Falls in the past year? No No No No No      Assessment & Plan  1. Benign essential HTN  - COMPLETE METABOLIC PANEL WITH GFR  2. Dyslipidemia  He was on Mevacor but switched to Atorvastatin Dec 2017, we will recheck levels - Lipid panel  3. Vitamin B12 deficiency  - Vitamin B12 Taking otc supplements  4. Late onset Alzheimer's disease without behavioral disturbance  Seen by Stark Klein NP, under Dr. Manuella Ghazi and is now on medication, no side effects   5. Paroxysmal atrial fibrillation (HCC)  No palpitation or Pradaxa and no side effects  6. Benign prostatic hyperplasia without lower urinary tract symptoms  - Ambulatory referral to Urology  7. Elevated PSA  - Ambulatory referral to Urology  8. Weight loss  It could be from new medication for Alzheimer's - TSH - CBC with Differential/Platelet All recommendations from home visit was up to date.

## 2016-09-11 LAB — COMPLETE METABOLIC PANEL WITH GFR
ALBUMIN: 4.2 g/dL (ref 3.6–5.1)
ALK PHOS: 78 U/L (ref 40–115)
ALT: 21 U/L (ref 9–46)
AST: 21 U/L (ref 10–35)
BILIRUBIN TOTAL: 0.8 mg/dL (ref 0.2–1.2)
BUN: 17 mg/dL (ref 7–25)
CO2: 26 mmol/L (ref 20–31)
Calcium: 9.2 mg/dL (ref 8.6–10.3)
Chloride: 104 mmol/L (ref 98–110)
Creat: 1.3 mg/dL — ABNORMAL HIGH (ref 0.70–1.18)
GFR, EST NON AFRICAN AMERICAN: 52 mL/min — AB (ref >=60)
GFR, Est African American: 60 mL/min (ref >=60)
GLUCOSE: 83 mg/dL (ref 65–99)
POTASSIUM: 4.7 mmol/L (ref 3.5–5.3)
SODIUM: 140 mmol/L (ref 135–146)
Total Protein: 7.2 g/dL (ref 6.1–8.1)

## 2016-09-11 LAB — LIPID PANEL
CHOL/HDL RATIO: 2.2 ratio (ref <5.0)
Cholesterol: 148 mg/dL (ref <200)
HDL: 68 mg/dL (ref >40)
LDL Cholesterol: 63 mg/dL (ref <100)
Triglycerides: 85 mg/dL (ref <150)
VLDL: 17 mg/dL (ref <30)

## 2016-09-11 LAB — VITAMIN B12: VITAMIN B 12: 1409 pg/mL — AB (ref 200–1100)

## 2016-09-24 DIAGNOSIS — R0602 Shortness of breath: Secondary | ICD-10-CM | POA: Diagnosis not present

## 2016-09-24 DIAGNOSIS — I480 Paroxysmal atrial fibrillation: Secondary | ICD-10-CM | POA: Diagnosis not present

## 2016-09-24 DIAGNOSIS — I495 Sick sinus syndrome: Secondary | ICD-10-CM | POA: Diagnosis not present

## 2016-09-24 DIAGNOSIS — I10 Essential (primary) hypertension: Secondary | ICD-10-CM | POA: Diagnosis not present

## 2016-09-24 DIAGNOSIS — R011 Cardiac murmur, unspecified: Secondary | ICD-10-CM | POA: Diagnosis not present

## 2016-09-24 DIAGNOSIS — R002 Palpitations: Secondary | ICD-10-CM | POA: Diagnosis not present

## 2016-09-24 DIAGNOSIS — R001 Bradycardia, unspecified: Secondary | ICD-10-CM | POA: Diagnosis not present

## 2016-09-24 DIAGNOSIS — I208 Other forms of angina pectoris: Secondary | ICD-10-CM | POA: Diagnosis not present

## 2016-09-24 DIAGNOSIS — E785 Hyperlipidemia, unspecified: Secondary | ICD-10-CM | POA: Diagnosis not present

## 2016-09-27 DIAGNOSIS — G301 Alzheimer's disease with late onset: Secondary | ICD-10-CM | POA: Diagnosis not present

## 2016-09-27 DIAGNOSIS — F0280 Dementia in other diseases classified elsewhere without behavioral disturbance: Secondary | ICD-10-CM | POA: Diagnosis not present

## 2016-09-27 DIAGNOSIS — E538 Deficiency of other specified B group vitamins: Secondary | ICD-10-CM | POA: Diagnosis not present

## 2016-09-27 DIAGNOSIS — I4891 Unspecified atrial fibrillation: Secondary | ICD-10-CM | POA: Diagnosis not present

## 2016-10-30 DIAGNOSIS — I208 Other forms of angina pectoris: Secondary | ICD-10-CM | POA: Diagnosis not present

## 2016-10-30 DIAGNOSIS — I480 Paroxysmal atrial fibrillation: Secondary | ICD-10-CM | POA: Diagnosis not present

## 2016-10-30 DIAGNOSIS — R0602 Shortness of breath: Secondary | ICD-10-CM | POA: Diagnosis not present

## 2016-11-24 ENCOUNTER — Other Ambulatory Visit: Payer: Self-pay | Admitting: Family Medicine

## 2016-11-24 DIAGNOSIS — E785 Hyperlipidemia, unspecified: Secondary | ICD-10-CM

## 2016-11-29 ENCOUNTER — Ambulatory Visit: Payer: Medicare PPO | Admitting: Family Medicine

## 2016-12-09 ENCOUNTER — Ambulatory Visit (INDEPENDENT_AMBULATORY_CARE_PROVIDER_SITE_OTHER): Payer: Medicare PPO | Admitting: Family Medicine

## 2016-12-09 ENCOUNTER — Encounter: Payer: Self-pay | Admitting: Family Medicine

## 2016-12-09 VITALS — BP 132/86 | HR 60 | Temp 97.8°F | Resp 16 | Ht 70.0 in | Wt 199.2 lb

## 2016-12-09 DIAGNOSIS — Z23 Encounter for immunization: Secondary | ICD-10-CM

## 2016-12-09 DIAGNOSIS — I480 Paroxysmal atrial fibrillation: Secondary | ICD-10-CM

## 2016-12-09 DIAGNOSIS — I10 Essential (primary) hypertension: Secondary | ICD-10-CM | POA: Diagnosis not present

## 2016-12-09 DIAGNOSIS — F0280 Dementia in other diseases classified elsewhere without behavioral disturbance: Secondary | ICD-10-CM | POA: Diagnosis not present

## 2016-12-09 DIAGNOSIS — E785 Hyperlipidemia, unspecified: Secondary | ICD-10-CM

## 2016-12-09 DIAGNOSIS — G301 Alzheimer's disease with late onset: Secondary | ICD-10-CM

## 2016-12-09 MED ORDER — LOSARTAN POTASSIUM 50 MG PO TABS: 50.0000 mg | ORAL_TABLET | Freq: Every day | ORAL | 1 refills | Status: DC

## 2016-12-09 NOTE — Progress Notes (Signed)
Name: Ronald Fleming   MRN: [id number]    DOB: 07-Dec-1935   Date:12/09/2016       Progress Note  Subjective  Chief Complaint  Chief Complaint  Patient presents with  . Medication Refill  . Hypertension    Patient is on Valsartan and needs to be switched since Manufacturer quit making it  . Hyperlipidemia  . Atrial Fibrillation  . Alzheimer's    HPI  HTN: he is still not checking bp at home, but bp has been at goal at our office, no chest pain or palpitation, no BLE edema, no vision changes. He requests a change from Valsartan due to recent medication recall, and we will do so for him today.   Hyperlipidemia: he is taking Atorvastatin for the past 8 months, lipid panel in June 2018 showed marked improvement.  He denies myalgias  Alzheimer's late onset: wife noticed he had memory loss, seen by neurologist and is now on Razadyne 12mg  twice daily (Sees Dr. Stark Klein who increased dose from 4mg  to 12mg  on 09/27/2016), he has been on medication since Fall 2017, doing well on medications.  There was concern at last visit regarding 15lb weight loss, patient has since gained 4lbs and has had good appetite - not forgetting to eat.  Afib: doing well, no SOB or palpitation, on Pradaxa and denies easy bleeding or bruising.  Needs flu shot today - we will provide this.  Patient Active Problem List   Diagnosis Date Noted  . Atrioventricular block, second degree 05/29/2016  . Vitamin B12 deficiency 05/21/2016  . Late onset Alzheimer's disease without behavioral disturbance 05/21/2016  . Vitiligo 02/12/2016  . History of DVT of lower extremity 08/03/2015  . BPH (benign prostatic hyperplasia) 08/03/2015  . Atrial fibrillation ([place]) 02/01/2015  . Benign paroxysmal positional nystagmus 08/23/2014  . Edema leg 08/23/2014  . Chronic kidney disease, stage III (moderate) 08/23/2014  . Bad memory 08/23/2014  . Neuropathy 08/23/2014  . Benign essential HTN 08/27/2006  . Hypercholesterolemia without  hypertriglyceridemia 08/27/2006    Social History  Substance Use Topics  . Smoking status: Never Smoker  . Smokeless tobacco: Never Used  . Alcohol use 0.0 oz/week     Comment: occasionally     Current Outpatient Prescriptions:  .  aspirin 81 MG tablet, Take 81 mg by mouth daily., Disp: , Rfl:  .  atorvastatin (LIPITOR) 20 MG tablet, TAKE 1 TABLET BY MOUTH ONCE DAILY FOR CHOLESTEROL (IN PLACE OF LOVASTATIN), Disp: 90 tablet, Rfl: 1 .  dabigatran (PRADAXA) 150 MG CAPS capsule, Take 1 capsule (150 mg total) by mouth 2 (two) times daily., Disp: 60 capsule, Rfl: 5 .  doxazosin (CARDURA) 2 MG tablet, Take 1 tablet by mouth daily., Disp: , Rfl:  .  galantamine (RAZADYNE) 12 MG tablet, Take by mouth., Disp: , Rfl:  .  tamsulosin (FLOMAX) 0.4 MG CAPS capsule, Take 1 capsule (0.4 mg total) by mouth daily., Disp: 90 capsule, Rfl: 1 .  vitamin B-12 (CYANOCOBALAMIN) 1000 MCG tablet, Take by mouth., Disp: , Rfl:  .  losartan (COZAAR) 50 MG tablet, Take 1 tablet (50 mg total) by mouth daily., Disp: 90 tablet, Rfl: 1  No Known Allergies  ROS  Constitutional: Negative for fever or weight change.  Respiratory: Negative for cough and shortness of breath.   Cardiovascular: Negative for chest pain or palpitations.  Gastrointestinal: Negative for abdominal pain, no bowel changes.  Musculoskeletal: Negative for gait problem or joint swelling.  Skin: Negative for rash.  Neurological: Negative  for dizziness or headache.  No other specific complaints in a complete review of systems (except as listed in HPI above).  Objective  Vitals:   12/09/16 1353  BP: 132/86  Pulse: 60  Resp: 16  Temp: 97.8 F (36.6 C)  TempSrc: Oral  SpO2: 96%  Weight: 199 lb 3.2 oz (90.4 kg)  Height: 5\' 10"  (1.778 m)   Body mass index is 28.58 kg/m.  Nursing Note and Vital Signs reviewed.  Physical Exam Constitutional: Patient appears well-developed and well-nourished. No distress.  HEENT: head atraumatic,  normocephalic Cardiovascular: Normal rate, regular rhythm - not currently in Afib/irregular rhythm, S1/S2 present.  No murmur or rub heard. Trace BLE edema. Pulmonary/Chest: Effort normal and breath sounds clear. No respiratory distress or retractions. Psychiatric: Patient has a normal mood and affect. behavior is normal. Judgment and thought content normal.  No results found for this or any previous visit (from the past 2160 hour(s)).   Assessment & Plan  1. Benign essential HTN  - losartan (COZAAR) 50 MG tablet; Take 1 tablet (50 mg total) by mouth daily.  Dispense: 90 tablet; Refill: 1 Change from Valsartan to Losartan   2. Needs flu shot  - Flu vaccine HIGH DOSE PF (Fluzone High dose)  3. Late onset Alzheimer's disease without behavioral disturbance  Stable, lives with wife  4. Paroxysmal atrial fibrillation (HCC)  Doing well, rate controlled at this time  5. Dyslipidemia  Continue statin therapy

## 2016-12-24 ENCOUNTER — Other Ambulatory Visit: Payer: Self-pay | Admitting: Family Medicine

## 2016-12-24 DIAGNOSIS — I480 Paroxysmal atrial fibrillation: Secondary | ICD-10-CM

## 2016-12-24 NOTE — Telephone Encounter (Signed)
Patient requesting refill of Pradaxa to [facility].

## 2017-01-02 ENCOUNTER — Ambulatory Visit: Payer: Medicare PPO | Admitting: Family Medicine

## 2017-01-29 ENCOUNTER — Telehealth: Payer: Self-pay | Admitting: Family Medicine

## 2017-01-29 NOTE — Telephone Encounter (Signed)
Wife saw on TV that Losartin was recalled. Wanting to know whether the Doctor is going to discontinue

## 2017-01-29 NOTE — Telephone Encounter (Signed)
Called patient, he is aware.

## 2017-01-29 NOTE — Telephone Encounter (Signed)
Only the ones produced by [facility]. His insurance should have contacted him about it

## 2017-02-19 ENCOUNTER — Ambulatory Visit (INDEPENDENT_AMBULATORY_CARE_PROVIDER_SITE_OTHER): Payer: Medicare PPO | Admitting: Family Medicine

## 2017-02-19 ENCOUNTER — Encounter: Payer: Self-pay | Admitting: Family Medicine

## 2017-02-19 VITALS — BP 110/80 | HR 62 | Temp 97.9°F | Resp 14 | Ht 70.47 in | Wt 196.4 lb

## 2017-02-19 DIAGNOSIS — Z0000 Encounter for general adult medical examination without abnormal findings: Secondary | ICD-10-CM

## 2017-02-19 DIAGNOSIS — Z13820 Encounter for screening for osteoporosis: Secondary | ICD-10-CM

## 2017-02-19 DIAGNOSIS — G301 Alzheimer's disease with late onset: Secondary | ICD-10-CM

## 2017-02-19 DIAGNOSIS — F0280 Dementia in other diseases classified elsewhere without behavioral disturbance: Secondary | ICD-10-CM

## 2017-02-19 NOTE — Patient Instructions (Signed)
Preventive Care 79 Years and Older, Male Preventive care refers to lifestyle choices and visits with your health care provider that can promote health and wellness. What does preventive care include?  A yearly physical exam. This is also called an annual well check.  Dental exams once or twice a year.  Routine eye exams. Ask your health care provider how often you should have your eyes checked.  Personal lifestyle choices, including: ? Daily care of your teeth and gums. ? Regular physical activity. ? Eating a healthy diet. ? Avoiding tobacco and drug use. ? Limiting alcohol use. ? Practicing safe sex. ? Taking low doses of aspirin every day. ? Taking vitamin and mineral supplements as recommended by your health care provider. What happens during an annual well check? The services and screenings done by your health care provider during your annual well check will depend on your age, overall health, lifestyle risk factors, and family history of disease. Counseling Your health care provider may ask you questions about your:  Alcohol use.  Tobacco use.  Drug use.  Emotional well-being.  Home and relationship well-being.  Sexual activity.  Eating habits.  History of falls.  Memory and ability to understand (cognition).  Work and work environment.  Screening You may have the following tests or measurements:  Height, weight, and BMI.  Blood pressure.  Lipid and cholesterol levels. These may be checked every 5 years, or more frequently if you are over 50 years old.  Skin check.  Lung cancer screening. You may have this screening every year starting at age 55 if you have a 30-pack-year history of smoking and currently smoke or have quit within the past 15 years.  Fecal occult blood test (FOBT) of the stool. You may have this test every year starting at age 50.  Flexible sigmoidoscopy or colonoscopy. You may have a sigmoidoscopy every 5 years or a colonoscopy every 10  years starting at age 50.  Prostate cancer screening. Recommendations will vary depending on your family history and other risks.  Hepatitis C blood test.  Hepatitis B blood test.  Sexually transmitted disease (STD) testing.  Diabetes screening. This is done by checking your blood sugar (glucose) after you have not eaten for a while (fasting). You may have this done every 1-3 years.  Abdominal aortic aneurysm (AAA) screening. You may need this if you are a current or former smoker.  Osteoporosis. You may be screened starting at age 70 if you are at high risk.  Talk with your health care provider about your test results, treatment options, and if necessary, the need for more tests. Vaccines Your health care provider may recommend certain vaccines, such as:  Influenza vaccine. This is recommended every year.  Tetanus, diphtheria, and acellular pertussis (Tdap, Td) vaccine. You may need a Td booster every 10 years.  Varicella vaccine. You may need this if you have not been vaccinated.  Zoster vaccine. You may need this after age 60.  Measles, mumps, and rubella (MMR) vaccine. You may need at least one dose of MMR if you were born in 1957 or later. You may also need a second dose.  Pneumococcal 13-valent conjugate (PCV13) vaccine. One dose is recommended after age 79.  Pneumococcal polysaccharide (PPSV23) vaccine. One dose is recommended after age 79.  Meningococcal vaccine. You may need this if you have certain conditions.  Hepatitis A vaccine. You may need this if you have certain conditions or if you travel or work in places where you   may be exposed to hepatitis A.  Hepatitis B vaccine. You may need this if you have certain conditions or if you travel or work in places where you may be exposed to hepatitis B.  Haemophilus influenzae type b (Hib) vaccine. You may need this if you have certain risk factors.  Talk to your health care provider about which screenings and vaccines  you need and how often you need them. This information is not intended to replace advice given to you by your health care provider. Make sure you discuss any questions you have with your health care provider. Document Released: 03/31/2015 Document Revised: 11/22/2015 Document Reviewed: 01/03/2015 Elsevier Interactive Patient Education  2017 [employer].

## 2017-02-19 NOTE — Progress Notes (Signed)
Patient: Ronald Fleming, Male    DOB: 1936-03-01, 81 y.o.   MRN: [id number]  Visit Date: 02/19/2017  Today's Provider: Loistine Chance, MD   Chief Complaint  Patient presents with  . Annual Exam    medicare    Subjective:   Ronald Fleming is a 81 y.o. male who presents today for his Subsequent Annual Wellness Visit.  Patient/Caregiver input:    HPI  Alzheimer's dementia: he sees neurologist, wife states that he takes medication by himself. Advised wife to contact insurance and see if they can give them pill packs. No change in behavior, he sleeps well at night. He is still driving by himself, he is not getting lost yet.   Review of Systems Constitutional: Negative for fever or weight change.  Respiratory: Negative for cough and shortness of breath.   Cardiovascular: Negative for chest pain or palpitations.  Gastrointestinal: Negative for abdominal pain, no bowel changes.  Musculoskeletal: Negative for gait problem or joint swelling.  Skin: Negative for rash.  Neurological: Negative for dizziness or headache.  No other specific complaints in a complete review of systems (except as listed in HPI above).  Past Medical History:  Diagnosis Date  . Arrhythmia   . Arthritis   . Benign positional vertigo   . Deep vein blood clot of right lower extremity ([place])   . Glaucoma   . Hyperlipidemia   . Hypertension   . Neuropathy     Past Surgical History:  Procedure Laterality Date  . NO PAST SURGERIES      Family History  Problem Relation Age of Onset  . Coronary artery disease Father   . Stroke Son   . Kidney disease Neg Hx   . Prostate cancer Neg Hx   . Bladder Cancer Neg Hx     Social History   Socioeconomic History  . Marital status: Married    Spouse name: Arlina Robes  . Number of children: 3  . Years of education: Not on file  . Highest education level: 12th grade  Social Needs  . Financial resource strain: Not very hard  . Food insecurity - worry: Never true   . Food insecurity - inability: Never true  . Transportation needs - medical: No  . 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 Smoking status: Never Smoker  . Smokeless tobacco: Never Used  Substance and Sexual Activity  . Alcohol use: Yes    Alcohol/week: 0.0 oz    Comment: occasionally  . Drug use: No  . Sexual activity: No    Partners: Female  Other Topics Concern  . Not on file  Social History Narrative   Married for 50 plus years.    They have two children that live in [place] and one son that lives in [place].    Outpatient Encounter Medications as of 02/19/2017  Medication Sig  . aspirin 81 MG tablet Take 81 mg by mouth daily.  Marland Kitchen atorvastatin (LIPITOR) 20 MG tablet TAKE 1 TABLET BY MOUTH ONCE DAILY FOR CHOLESTEROL (IN PLACE OF LOVASTATIN)  . galantamine (RAZADYNE) 12 MG tablet Take by mouth.  . losartan (COZAAR) 50 MG tablet Take 1 tablet (50 mg total) by mouth daily.  Marland Kitchen PRADAXA 150 MG CAPS capsule TAKE 1 CAPSULE BY MOUTH TWICE DAILY  . tamsulosin (FLOMAX) 0.4 MG CAPS capsule Take 1 capsule (0.4 mg total) by mouth daily.  Marland Kitchen  doxazosin (CARDURA) 2 MG tablet Take 1 tablet by mouth daily.  . vitamin B-12 (CYANOCOBALAMIN) 1000 MCG tablet Take by mouth.   No facility-administered encounter medications on file as of 02/19/2017.     No Known Allergies  Care Team Updated in EHR: Yes  Last Vision Exam: 2017  Wears corrective lenses: Yes Last Dental Exam: uses dentures Last Hearing Exam:not recently   Wears Hearing Aids: No  Functional Ability / Safety Screening 1.  Was the timed Get Up and Go test longer than 30 seconds?  no 2.  Does the patient need help with the phone, transportation, shopping,      preparing meals, housework, laundry, medications, or managing money?  no 3.  Does the patient's home have:  loose throw rugs in the hallway?   Yes - needs to be  removed      Grab bars in the bathroom? no      Handrails on the stairs?   no      Poor lighting?   no 4.  Has the patient noticed any hearing difficulties?   no  Diet Recall and Exercise Regimen:   Eats a balanced diet, enough calcium, very active in his yard, still uses a wood heater at home - discussed fire hazard  Fall Risk: See screening under Objective Information  Depression Screen: See screening under Objective Information  Advanced Directives: A voluntary discussion about advance care planning including the explanation and discussion of advance directives was discussed with the patient. Explanation about the health care proxy and living will was reviewed.  During this discussion, the patient was able to identify a health care proxy as wife .and plans/does not plan to fill out the paperwork required and will bring this to our office to keep on file. Does patient have a HCPOA?    yes If yes, name and contact information:  Does patient have a living will or MOST form?  yes  Cancer Screenings:  Lung: Low Dose CT Chest recommended if Age 90-80 years, 30 pack-year currently smoking OR have quit w/in 15years. Patient does not qualify.  Lifestyle risk factor issued reviewed: Diet, exercise, weight management, advised patient smoking is not healthy, nutrition/diet.   Prostate: sees urologist  Colon: is up to date - 2011   Additional Screenings:   Intimate Partner Violence: negative screen AAA Screen: Men age 55 to 43 years if ever smoked recommended to get a one time AAA ultrasound screening exam. Patient does not qualify.  Objective:   Vitals: BP 110/80 (BP Location: Right Arm, Patient Position: Sitting, Cuff Size: Normal)   Pulse 62   Temp 97.9 F (36.6 C) (Oral)   Resp 14   Ht 5' 10.47" (1.79 m)   Wt 196 lb 6.4 oz (89.1 kg)   SpO2 97%   BMI 27.80 kg/m  Body mass index is 27.8 kg/m.  Lab Results  Component Value Date   CHOL 148 09/10/2016   CHOL 202 (H) 02/12/2016    CHOL 168 02/01/2015   Lab Results  Component Value Date   HDL 68 09/10/2016   HDL 62 02/12/2016   HDL 65 02/01/2015   Lab Results  Component Value Date   LDLCALC 63 09/10/2016   LDLCALC 121 (H) 02/12/2016   LDLCALC 83 02/01/2015   Lab Results  Component Value Date   TRIG 85 09/10/2016   TRIG 97 02/12/2016   TRIG 99 02/01/2015   Lab Results  Component Value Date   CHOLHDL 2.2 09/10/2016  CHOLHDL 3.3 02/12/2016   CHOLHDL 2.6 02/01/2015   No results found for: LDLDIRECT  No exam data present  Physical Exam   Constitutional: Patient appears well-developed and well-nourished.  No distress.  HEENT: head atraumatic, normocephalic, pupils equal and reactive to light,  neck supple, throat within normal limits Cardiovascular: Normal rate, regular rhythm and normal heart sounds.  No murmur heard. No BLE edema. Pulmonary/Chest: Effort normal and breath sounds normal. No respiratory distress. Abdominal: Soft.  There is no tenderness. Psychiatric: Patient has a normal mood and affect. behavior is normal. Judgment and thought content normal.   Cognitive Testing - 6-CIT  Correct? Score   What year is it? yes 0 Yes = 0    No = 4  What month is it? yes 0 Yes = 0    No = 3  Remember:     [place]ia [place], [place], [place]     What time is it? yes 0 Yes = 0    No = 3  Count backwards from 20 to 1 yes 0 Correct = 0    1 error = 2   More than 1 error = 4  Say the months of the year in reverse. yes 0 Correct = 0    1 error = 2   More than 1 error = 4  What address did I ask you to remember? yes 2 Correct = 0  1 error = 2    2 error = 4    3 error = 6    4 error = 8    All wrong = 10       TOTAL SCORE  2/28   Interpretation:  Normal  Normal (0-7) Abnormal (8-28)   Fall Risk: Fall Risk  02/19/2017 12/09/2016 05/29/2016 02/12/2016 09/22/2015  Falls in the past year? No No No No No    Depression Screen Depression screen [facility] 2/9 02/19/2017 12/09/2016 05/29/2016 02/12/2016 09/22/2015   Decreased Interest 0 0 0 0 0  Down, Depressed, Hopeless 0 0 0 0 0  PHQ - 2 Score 0 0 0 0 0    No results found for this or any previous visit (from the past 2160 hour(s)).   Assessment & Plan:    1. Medicare annual wellness visit, subsequent  Discussed importance of 150 minutes of physical activity weekly, eat two servings of fish weekly, eat one serving of tree nuts ( cashews, pistachios, pecans, almonds.Marland Kitchen) every other day, eat 6 servings of fruit/vegetables daily and drink plenty of water and avoid sweet beverages.  Discussed risk of osteoporosis and bone density.  2. Late onset Alzheimer's disease without behavioral disturbance  Continue follow up with neurologist  3. Screening for osteoporosis  - DG Bone Density; Future  Immunization History  Administered Date(s) Administered  . Influenza Split 03/25/2008, 12/19/2008, 11/29/2009  . Influenza, High Dose Seasonal PF 04/17/2015, 12/29/2015, 12/09/2016  . Influenza, Seasonal, Injecte, Preservative Fre 12/18/2010, 12/06/2011  . Pneumococcal Conjugate-13 05/25/2013  . Pneumococcal Polysaccharide-23 11/29/2009  . Tdap 03/28/2010  . Zoster 11/09/2010    Health Maintenance  Topic Date Due  . Samul Dada  03/28/2020  . INFLUENZA VACCINE  Completed  . PNA vac Low Risk Adult  Completed     No orders of the defined types were placed in this encounter.   Current Outpatient Medications:  .  aspirin 81 MG tablet, Take 81 mg by mouth daily., Disp: , Rfl:  .  atorvastatin (LIPITOR) 20 MG tablet, TAKE 1 TABLET  BY MOUTH ONCE DAILY FOR CHOLESTEROL (IN PLACE OF LOVASTATIN), Disp: 90 tablet, Rfl: 1 .  galantamine (RAZADYNE) 12 MG tablet, Take by mouth., Disp: , Rfl:  .  losartan (COZAAR) 50 MG tablet, Take 1 tablet (50 mg total) by mouth daily., Disp: 90 tablet, Rfl: 1 .  PRADAXA 150 MG CAPS capsule, TAKE 1 CAPSULE BY MOUTH TWICE DAILY, Disp: 60 capsule, Rfl: 5 .  tamsulosin (FLOMAX) 0.4 MG CAPS capsule, Take 1 capsule (0.4 mg  total) by mouth daily., Disp: 90 capsule, Rfl: 1 .  doxazosin (CARDURA) 2 MG tablet, Take 1 tablet by mouth daily., Disp: , Rfl:  .  vitamin B-12 (CYANOCOBALAMIN) 1000 MCG tablet, Take by mouth., Disp: , Rfl:  There are no discontinued medications.  I have personally reviewed and addressed the Medicare Annual Wellness health risk assessment questionnaire and have noted the following in the patient's chart:  A.         Medical and social history & family history B.         Use of alcohol, tobacco or illicit drugs  C.         Current medications and supplements D.         Functional and Cognitive ability and status E.         Nutritional status F.         Physical activity G.        Advance directives H.         List of other physicians I.          Hospitalizations, surgeries, and ER visits in previous 12 months J.         [place] such as hearing and vision if needed, cognitive and depression L.         Referrals and appointments -  In addition, I have reviewed and discussed with patient certain preventive protocols, quality metrics, and best practice recommendations. A written personalized care plan for preventive services as well as general preventive health recommendations were provided to patient.  See attached scanned questionnaire for additional information.   No Follow-up on file.  *refresh to show

## 2017-03-21 ENCOUNTER — Other Ambulatory Visit: Payer: Self-pay | Admitting: Family Medicine

## 2017-03-21 DIAGNOSIS — N400 Enlarged prostate without lower urinary tract symptoms: Secondary | ICD-10-CM

## 2017-03-21 DIAGNOSIS — I10 Essential (primary) hypertension: Secondary | ICD-10-CM

## 2017-03-21 MED ORDER — TAMSULOSIN HCL 0.4 MG PO CAPS: 0.4000 mg | ORAL_CAPSULE | Freq: Every day | ORAL | 0 refills | Status: DC

## 2017-03-21 MED ORDER — LOSARTAN POTASSIUM 50 MG PO TABS: 50.0000 mg | ORAL_TABLET | Freq: Every day | ORAL | 0 refills | Status: DC

## 2017-03-21 NOTE — Telephone Encounter (Signed)
Copied from [place] [id number]. Topic: Quick Communication - See Telephone Encounter >> Mar 21, 2017  3:49 PM Percell Belt A wrote: CRM for notification. See Telephone encounter for: Pharmacy called in and said that pt needs a 30 day supply sent to [facility] on [address] . Fax number [phone]  [place]  Tumsalosin   03/21/17.

## 2017-03-26 DIAGNOSIS — I480 Paroxysmal atrial fibrillation: Secondary | ICD-10-CM | POA: Diagnosis not present

## 2017-03-26 DIAGNOSIS — R0602 Shortness of breath: Secondary | ICD-10-CM | POA: Diagnosis not present

## 2017-03-26 DIAGNOSIS — I208 Other forms of angina pectoris: Secondary | ICD-10-CM | POA: Diagnosis not present

## 2017-03-26 DIAGNOSIS — R002 Palpitations: Secondary | ICD-10-CM | POA: Diagnosis not present

## 2017-03-26 DIAGNOSIS — R001 Bradycardia, unspecified: Secondary | ICD-10-CM | POA: Diagnosis not present

## 2017-03-26 DIAGNOSIS — R011 Cardiac murmur, unspecified: Secondary | ICD-10-CM | POA: Diagnosis not present

## 2017-03-26 DIAGNOSIS — I10 Essential (primary) hypertension: Secondary | ICD-10-CM | POA: Diagnosis not present

## 2017-03-26 DIAGNOSIS — E785 Hyperlipidemia, unspecified: Secondary | ICD-10-CM | POA: Diagnosis not present

## 2017-03-26 DIAGNOSIS — I495 Sick sinus syndrome: Secondary | ICD-10-CM | POA: Diagnosis not present

## 2017-03-31 DIAGNOSIS — I4891 Unspecified atrial fibrillation: Secondary | ICD-10-CM | POA: Diagnosis not present

## 2017-03-31 DIAGNOSIS — F0280 Dementia in other diseases classified elsewhere without behavioral disturbance: Secondary | ICD-10-CM | POA: Diagnosis not present

## 2017-03-31 DIAGNOSIS — E538 Deficiency of other specified B group vitamins: Secondary | ICD-10-CM | POA: Diagnosis not present

## 2017-03-31 DIAGNOSIS — G301 Alzheimer's disease with late onset: Secondary | ICD-10-CM | POA: Diagnosis not present

## 2017-04-11 ENCOUNTER — Ambulatory Visit: Payer: Medicare PPO | Admitting: Family Medicine

## 2017-05-14 ENCOUNTER — Ambulatory Visit
Admission: RE | Admit: 2017-05-14 | Discharge: 2017-05-14 | Disposition: A | Discharge status: Home / Self Care | Payer: Medicare PPO | Source: Ambulatory Visit | Attending: Family Medicine | Admitting: Family Medicine

## 2017-05-14 DIAGNOSIS — Z13820 Encounter for screening for osteoporosis: Secondary | ICD-10-CM | POA: Insufficient documentation

## 2017-05-14 DIAGNOSIS — M85851 Other specified disorders of bone density and structure, right thigh: Secondary | ICD-10-CM | POA: Diagnosis not present

## 2017-05-14 DIAGNOSIS — M8588 Other specified disorders of bone density and structure, other site: Secondary | ICD-10-CM | POA: Insufficient documentation

## 2017-05-21 ENCOUNTER — Encounter: Payer: Self-pay | Admitting: Family Medicine

## 2017-05-21 ENCOUNTER — Ambulatory Visit: Payer: Medicare PPO | Admitting: Family Medicine

## 2017-05-21 VITALS — BP 120/90 | HR 56 | Resp 14 | Ht 70.0 in | Wt 194.6 lb

## 2017-05-21 DIAGNOSIS — G301 Alzheimer's disease with late onset: Secondary | ICD-10-CM

## 2017-05-21 DIAGNOSIS — M8580 Other specified disorders of bone density and structure, unspecified site: Secondary | ICD-10-CM | POA: Diagnosis not present

## 2017-05-21 DIAGNOSIS — F0280 Dementia in other diseases classified elsewhere without behavioral disturbance: Secondary | ICD-10-CM | POA: Diagnosis not present

## 2017-05-21 DIAGNOSIS — N401 Enlarged prostate with lower urinary tract symptoms: Secondary | ICD-10-CM

## 2017-05-21 DIAGNOSIS — R3915 Urgency of urination: Secondary | ICD-10-CM

## 2017-05-21 DIAGNOSIS — I10 Essential (primary) hypertension: Secondary | ICD-10-CM

## 2017-05-21 DIAGNOSIS — N183 Chronic kidney disease, stage 3 unspecified: Secondary | ICD-10-CM

## 2017-05-21 DIAGNOSIS — I480 Paroxysmal atrial fibrillation: Secondary | ICD-10-CM

## 2017-05-21 DIAGNOSIS — E785 Hyperlipidemia, unspecified: Secondary | ICD-10-CM

## 2017-05-21 DIAGNOSIS — E538 Deficiency of other specified B group vitamins: Secondary | ICD-10-CM | POA: Diagnosis not present

## 2017-05-21 DIAGNOSIS — N400 Enlarged prostate without lower urinary tract symptoms: Secondary | ICD-10-CM | POA: Diagnosis not present

## 2017-05-21 MED ORDER — TAMSULOSIN HCL 0.4 MG PO CAPS: 0.4000 mg | ORAL_CAPSULE | Freq: Every day | ORAL | 1 refills | Status: DC

## 2017-05-21 MED ORDER — LOSARTAN POTASSIUM 50 MG PO TABS: 50.0000 mg | ORAL_TABLET | Freq: Every day | ORAL | 0 refills | Status: DC

## 2017-05-21 MED ORDER — DABIGATRAN ETEXILATE MESYLATE 150 MG PO CAPS: 150.0000 mg | ORAL_CAPSULE | Freq: Two times a day (BID) | ORAL | 5 refills | Status: DC

## 2017-05-21 MED ORDER — ATORVASTATIN CALCIUM 20 MG PO TABS: ORAL_TABLET | ORAL | 1 refills | Status: DC

## 2017-05-21 NOTE — Progress Notes (Signed)
Name: Ronald Fleming   MRN: [id number]    DOB: 1935-07-08   Date:05/21/2017       Progress Note  Subjective  Chief Complaint  Chief Complaint  Patient presents with  . Hypertension  . Alzheimer's Disease    HPI  HTN: DBP is a little high, no chest pain or palpitation,chronic right lower leg edema ( history of DVT right leg) - but stable.  No  vision changes. He is now on Losartan   Hyperlipidemia: taking statins, denies side effects, we will recheck labs  Alzheimer's late onset: wife noticed he had memory loss, seen by neurologist and is now on Razadyne 12mg  twice daily (Sees Dr. Stark Klein who increased dose from 4mg  to 12mg  on 09/27/2016), he has been on medication since Fall 2017, doing well on medications.  Lost 2 lbs since last visit, but weight has been stable, he had lost 15 lbs in the previous year. Wife states he does not forget to eat  Afib: doing well, no SOB or palpitation, on Pradaxa and denies easy bleeding or bruising. Seen by Dr. Clayborn Bigness January 2019  [place] III: we will recheck level, no pruritis  Afib: chronic, no symptoms on pradaxa  BPH: he has urinary frequency during the day only, he also has nocturia, he is on Flomax,   IPSS Questionnaire (AUA-7): Over the past month.   1)  How often have you had a sensation of not emptying your bladder completely after you finish urinating?  0 - Not at all  2)  How often have you had to urinate again less than two hours after you finished urinating? 1 - Less than 1 time in 5  3)  How often have you found you stopped and started again several times when you urinated?  1 - Less than 1 time in 5  4) How difficult have you found it to postpone urination?  5 - Almost always  5) How often have you had a weak urinary stream?  0 - Not at all  6) How often have you had to push or strain to begin urination?  0 - Not at all  7) How many times did you most typically get up to urinate from the time you went to bed until the time you got up in  the morning?  3 - 3 times  Total score:  0-7 mildly symptomatic   8-19 moderately symptomatic   20-35 severely symptomatic     Patient Active Problem List   Diagnosis Date Noted  . Osteopenia 05/21/2017  . Atrioventricular block, second degree 05/29/2016  . Vitamin B12 deficiency 05/21/2016  . Late onset Alzheimer's disease without behavioral disturbance 05/21/2016  . Vitiligo 02/12/2016  . History of DVT of lower extremity 08/03/2015  . BPH (benign prostatic hyperplasia) 08/03/2015  . Atrial fibrillation ([place]) 02/01/2015  . Benign paroxysmal positional nystagmus 08/23/2014  . Edema leg 08/23/2014  . Chronic kidney disease, stage III (moderate) ([place]) 08/23/2014  . Neuropathy 08/23/2014  . Benign essential HTN 08/27/2006  . Hypercholesterolemia without hypertriglyceridemia 08/27/2006    Past Surgical History:  Procedure Laterality Date  . NO PAST SURGERIES      Family History  Problem Relation Age of Onset  . Coronary artery disease Father   . Stroke Son   . Kidney disease Neg Hx   . Prostate cancer Neg Hx   . Bladder Cancer Neg Hx     Social History   Socioeconomic History  . Marital status: Married  Spouse name: Calvesta  . Number of children: 3  . Years of education: Not on file  . Highest education level: 12th grade  Social Needs  . Financial resource strain: Not very hard  . Food insecurity - worry: Never true  . Food insecurity - inability: Never true  . Transportation needs - medical: No  . 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 Smoking status: Never Smoker  . Smokeless tobacco: Never Used  Substance and Sexual Activity  . Alcohol use: Yes    Alcohol/week: 0.0 oz    Comment: occasionally  . Drug use: No  . Sexual activity: No    Partners: Female  Other Topics Concern  . Not on file  Social History Narrative   Married for 50 plus  years.    They have two children that live in [place] and one son that lives in [place].     Current Outpatient Medications:  .  aspirin 81 MG tablet, Take 81 mg by mouth daily., Disp: , Rfl:  .  atorvastatin (LIPITOR) 20 MG tablet, TAKE 1 TABLET BY MOUTH ONCE DAILY FOR CHOLESTEROL (IN PLACE OF LOVASTATIN), Disp: 90 tablet, Rfl: 1 .  dabigatran (PRADAXA) 150 MG CAPS capsule, Take 1 capsule (150 mg total) by mouth 2 (two) times daily., Disp: 60 capsule, Rfl: 5 .  galantamine (RAZADYNE) 12 MG tablet, Take 1 tablet by mouth daily., Disp: , Rfl:  .  losartan (COZAAR) 50 MG tablet, Take 1 tablet (50 mg total) by mouth daily., Disp: 90 tablet, Rfl: 0 .  tamsulosin (FLOMAX) 0.4 MG CAPS capsule, Take 1 capsule (0.4 mg total) by mouth daily., Disp: 90 capsule, Rfl: 1 .  vitamin B-12 (CYANOCOBALAMIN) 1000 MCG tablet, Take by mouth., Disp: , Rfl:   No Known Allergies   ROS  Constitutional: Negative for fever or weight change.  Respiratory: Negative for cough and shortness of breath.   Cardiovascular: Negative for chest pain or palpitations.  Gastrointestinal: Negative for abdominal pain, no bowel changes.  Musculoskeletal: Negative for gait problem or joint swelling.  Skin: Negative for rash.  Neurological: Negative for dizziness or headache.  No other specific complaints in a complete review of systems (except as listed in HPI above).  Objective  Vitals:   05/21/17 1011  BP: 120/90  Pulse: (!) 56  Resp: 14  SpO2: 98%  Weight: 194 lb 9.6 oz (88.3 kg)  Height: 5\' 10"  (1.778 m)    Body mass index is 27.92 kg/m.  Physical Exam  Constitutional: Patient appears well-developed and well-nourished.  No distress.  HEENT: head atraumatic, normocephalic, pupils equal and reactive to light, neck supple, throat within normal limits Cardiovascular: Normal rate, irregular rhythm and normal heart sounds.  No murmur heard. Trace lower leg  edema. Pulmonary/Chest: Effort normal and breath sounds  normal. No respiratory distress. Abdominal: Soft.  There is no tenderness. Psychiatric: Patient has a normal mood and affect. behavior is normal. Judgment and thought content normal.  PHQ2/9: Depression screen [facility] 2/9 02/19/2017 12/09/2016 05/29/2016 02/12/2016 09/22/2015  Decreased Interest 0 0 0 0 0  Down, Depressed, Hopeless 0 0 0 0 0  PHQ - 2 Score 0 0 0 0 0    Fall Risk: Fall Risk  05/21/2017 02/19/2017 12/09/2016 05/29/2016 02/12/2016  Falls in the past year? No No No No No    Functional Status Survey: Is the patient deaf or have  difficulty hearing?: No Does the patient have difficulty seeing, even when wearing glasses/contacts?: No Does the patient have difficulty concentrating, remembering, or making decisions?: No Does the patient have difficulty walking or climbing stairs?: No Does the patient have difficulty dressing or bathing?: No Does the patient have difficulty doing errands alone such as visiting a doctor's office or shopping?: No    Assessment & Plan  1. Benign essential HTN  DBP a little high, but usually at goal, we recently changed from valsartan to losartan, wife will monitor bp at home before we increase dose - losartan (COZAAR) 50 MG tablet; Take 1 tablet (50 mg total) by mouth daily.  Dispense: 90 tablet; Refill: 0  2. Late onset Alzheimer's disease without behavioral disturbance  Seeing Dr. Manuella Ghazi, taking medication, stable  3. Dyslipidemia  - Lipid panel - atorvastatin (LIPITOR) 20 MG tablet; TAKE 1 TABLET BY MOUTH ONCE DAILY FOR CHOLESTEROL (IN PLACE OF LOVASTATIN)  Dispense: 90 tablet; Refill: 1  4. Vitamin B12 deficiency  - Vitamin B12  5. Paroxysmal atrial fibrillation (HCC)   no bleeding  - dabigatran (PRADAXA) 150 MG CAPS capsule; Take 1 capsule (150 mg total) by mouth 2 (two) times daily.  Dispense: 60 capsule; Refill: 5  6. Benign prostatic hyperplasia with lower urinary tract symptoms  - tamsulosin (FLOMAX) 0.4 MG CAPS capsule; Take 1  capsule (0.4 mg total) by mouth daily.  Dispense: 90 capsule; Refill: 1  7. Osteopenia, unspecified location  Low FRAX score, discussed high calcium and vitamin D  -Vitamin D  8. Chronic kidney disease, stage III (moderate) (HCC)  - COMPLETE METABOLIC PANEL WITH GFR - CBC with Differential/Platelet -Vitamin D

## 2017-05-22 LAB — COMPLETE METABOLIC PANEL WITH GFR
AG RATIO: 1.4 (calc) (ref 1.0–2.5)
ALKALINE PHOSPHATASE (APISO): 73 U/L (ref 40–115)
ALT: 21 U/L (ref 9–46)
AST: 26 U/L (ref 10–35)
Albumin: 4 g/dL (ref 3.6–5.1)
BUN/Creatinine Ratio: 12 (calc) (ref 6–22)
BUN: 15 mg/dL (ref 7–25)
CHLORIDE: 106 mmol/L (ref 98–110)
CO2: 29 mmol/L (ref 20–32)
Calcium: 9.2 mg/dL (ref 8.6–10.3)
Creat: 1.23 mg/dL — ABNORMAL HIGH (ref 0.70–1.18)
GFR, Est African American: 64 mL/min/{1.73_m2} (ref >=60)
GFR, Est Non African American: 55 mL/min/{1.73_m2} — ABNORMAL LOW (ref >=60)
GLUCOSE: 89 mg/dL (ref 65–139)
Globulin: 2.8 g/dL (calc) (ref 1.9–3.7)
POTASSIUM: 4.2 mmol/L (ref 3.5–5.3)
Sodium: 141 mmol/L (ref 135–146)
Total Bilirubin: 0.7 mg/dL (ref 0.2–1.2)
Total Protein: 6.8 g/dL (ref 6.1–8.1)

## 2017-05-22 LAB — CBC WITH DIFFERENTIAL/PLATELET
BASOS ABS: 29 {cells}/uL (ref 0–200)
Basophils Relative: 0.5 %
EOS PCT: 1.6 %
Eosinophils Absolute: 91 cells/uL (ref 15–500)
HCT: 41.9 % (ref 38.5–50.0)
HEMOGLOBIN: 14.2 g/dL (ref 13.2–17.1)
Lymphs Abs: 1197 cells/uL (ref 850–3900)
MCH: 32.2 pg (ref 27.0–33.0)
MCHC: 33.9 g/dL (ref 32.0–36.0)
MCV: 95 fL (ref 80.0–100.0)
MPV: 11.4 fL (ref 7.5–12.5)
Monocytes Relative: 7.4 %
NEUTROS ABS: 3962 {cells}/uL (ref 1500–7800)
Neutrophils Relative %: 69.5 %
Platelets: 157 10*3/uL (ref 140–400)
RBC: 4.41 10*6/uL (ref 4.20–5.80)
RDW: 11.6 % (ref 11.0–15.0)
Total Lymphocyte: 21 %
WBC mixed population: 422 cells/uL (ref 200–950)
WBC: 5.7 10*3/uL (ref 3.8–10.8)

## 2017-05-22 LAB — LIPID PANEL
CHOL/HDL RATIO: 2.3 (calc) (ref <5.0)
Cholesterol: 145 mg/dL (ref <200)
HDL: 64 mg/dL (ref >40)
LDL Cholesterol (Calc): 67 mg/dL (calc)
Non-HDL Cholesterol (Calc): 81 mg/dL (calc) (ref <130)
TRIGLYCERIDES: 55 mg/dL (ref <150)

## 2017-05-22 LAB — VITAMIN D 25 HYDROXY (VIT D DEFICIENCY, FRACTURES): VIT D 25 HYDROXY: 17 ng/mL — AB (ref 30–100)

## 2017-05-22 LAB — PSA: PSA: 4.5 ng/mL — AB (ref <=4.0)

## 2017-05-22 LAB — VITAMIN B12: Vitamin B-12: 595 pg/mL (ref 200–1100)

## 2017-05-25 ENCOUNTER — Other Ambulatory Visit: Payer: Self-pay | Admitting: Family Medicine

## 2017-05-25 MED ORDER — VITAMIN D (ERGOCALCIFEROL) 1.25 MG (50000 UNIT) PO CAPS: 50000.0000 [IU] | ORAL_CAPSULE | ORAL | 0 refills | Status: DC

## 2017-05-26 ENCOUNTER — Ambulatory Visit: Payer: Self-pay | Admitting: *Deleted

## 2017-05-26 ENCOUNTER — Ambulatory Visit: Payer: Medicare PPO | Admitting: Family Medicine

## 2017-05-26 ENCOUNTER — Encounter: Payer: Self-pay | Admitting: Family Medicine

## 2017-05-26 VITALS — BP 124/80 | Resp 16 | Ht 70.0 in | Wt 193.4 lb

## 2017-05-26 DIAGNOSIS — R310 Gross hematuria: Secondary | ICD-10-CM

## 2017-05-26 LAB — POCT URINALYSIS DIPSTICK
GLUCOSE UA: NEGATIVE
Ketones, UA: NEGATIVE
LEUKOCYTES UA: NEGATIVE
Nitrite, UA: NEGATIVE
Protein, UA: NEGATIVE
Spec Grav, UA: 1.02 (ref 1.010–1.025)
Urobilinogen, UA: NEGATIVE E.U./dL — AB
pH, UA: 5 (ref 5.0–8.0)

## 2017-05-26 NOTE — Patient Instructions (Signed)

## 2017-05-26 NOTE — Progress Notes (Signed)
Name: Ronald Fleming   MRN: [id number]    DOB: January 17, 1936   Date:05/26/2017       Progress Note  Subjective  Chief Complaint  Chief Complaint  Patient presents with  . Hematuria    HPI  Hematuria: he noticed macroscopic hematuria when voiding for the second time on Saturday, it lasted about 12 hours and stopped by itself. No dysuria, no straining or hesitancy, he noticed some low back pain on left side but that has passed since. He states low back pain was mild and intermittent. No fever or chills, change appetite, nausea or vomiting. He is symptom free for the past 36 hours. He takes Pradaxa but only had one previous episode of hematuria about 10 years ago, not sure if evaluated by Urologist at the time.    Patient Active Problem List   Diagnosis Date Noted  . Osteopenia 05/21/2017  . Atrioventricular block, second degree 05/29/2016  . Vitamin B12 deficiency 05/21/2016  . Late onset Alzheimer's disease without behavioral disturbance 05/21/2016  . Vitiligo 02/12/2016  . History of DVT of lower extremity 08/03/2015  . BPH (benign prostatic hyperplasia) 08/03/2015  . Atrial fibrillation ([place]) 02/01/2015  . Benign paroxysmal positional nystagmus 08/23/2014  . Edema leg 08/23/2014  . Chronic kidney disease, stage III (moderate) ([place]) 08/23/2014  . Neuropathy 08/23/2014  . Benign essential HTN 08/27/2006  . Hypercholesterolemia without hypertriglyceridemia 08/27/2006    Past Surgical History:  Procedure Laterality Date  . NO PAST SURGERIES      Family History  Problem Relation Age of Onset  . Coronary artery disease Father   . Stroke Son   . Kidney disease Neg Hx   . Prostate cancer Neg Hx   . Bladder Cancer Neg Hx     Social History   Socioeconomic History  . Marital status: Married    Spouse name: Arlina Robes  . Number of children: 3  . Years of education: Not on file  . Highest education level: 12th grade  Social Needs  . Financial resource strain: Not very hard  .  Food insecurity - worry: Never true  . Food insecurity - inability: Never true  . Transportation needs - medical: No  . 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 Smoking status: Never Smoker  . Smokeless tobacco: Never Used  Substance and Sexual Activity  . Alcohol use: Yes    Alcohol/week: 0.0 oz    Comment: occasionally  . Drug use: No  . Sexual activity: No    Partners: Female  Other Topics Concern  . Not on file  Social History Narrative   Married for 50 plus years.    They have two children that live in [place] and one son that lives in [place].     Current Outpatient Medications:  .  aspirin 81 MG tablet, Take 81 mg by mouth daily., Disp: , Rfl:  .  atorvastatin (LIPITOR) 20 MG tablet, TAKE 1 TABLET BY MOUTH ONCE DAILY FOR CHOLESTEROL (IN PLACE OF LOVASTATIN), Disp: 90 tablet, Rfl: 1 .  dabigatran (PRADAXA) 150 MG CAPS capsule, Take 1 capsule (150 mg total) by mouth 2 (two) times daily., Disp: 60 capsule, Rfl: 5 .  galantamine (RAZADYNE) 12 MG tablet, Take 1 tablet by mouth daily., Disp: , Rfl:  .  losartan (COZAAR) 50 MG tablet, Take 1 tablet (50 mg total) by mouth daily., Disp: 90 tablet, Rfl:  0 .  tamsulosin (FLOMAX) 0.4 MG CAPS capsule, Take 1 capsule (0.4 mg total) by mouth daily., Disp: 90 capsule, Rfl: 1 .  vitamin B-12 (CYANOCOBALAMIN) 1000 MCG tablet, Take by mouth., Disp: , Rfl:  .  Vitamin D, Ergocalciferol, (DRISDOL) 50000 units CAPS capsule, Take 1 capsule (50,000 Units total) by mouth every 7 (seven) days., Disp: 12 capsule, Rfl: 0  No Known Allergies   ROS  Ten systems reviewed and is negative except as mentioned in HPI   Objective  Vitals:   05/26/17 1244  BP: 124/80  Resp: 16  SpO2: 95%  Weight: 193 lb 6.4 oz (87.7 kg)  Height: 5\' 10"  (1.778 m)    Body mass index is 27.75 kg/m.  Physical Exam  Constitutional: Patient appears  well-developed and well-nourished. No distress.  HEENT: head atraumatic, normocephalic, pupils equal and reactive to light, neck supple, throat within normal limits Cardiovascular: Normal rate, regular rhythm and normal heart sounds.  No murmur heard. No BLE edema. Pulmonary/Chest: Effort normal and breath sounds normal. No respiratory distress. Abdominal: Soft.  There is no tenderness. CVA negative Psychiatric: Patient has a normal mood and affect. behavior is normal. Judgment and thought content normal.   Recent Results (from the past 2160 hour(s))  Vitamin B12     Status: None   Collection Time: 05/21/17 11:00 AM  Result Value Ref Range   Vitamin B-12 595 200 - 1,100 pg/mL  COMPLETE METABOLIC PANEL WITH GFR     Status: Abnormal   Collection Time: 05/21/17 11:00 AM  Result Value Ref Range   Glucose, Bld 89 65 - 139 mg/dL    Comment: .        Non-fasting reference interval .    BUN 15 7 - 25 mg/dL   Creat 1.23 (H) 0.70 - 1.18 mg/dL    Comment: For patients >9 years of age, the reference limit for Creatinine is approximately 13% higher for people identified as African-American. .    GFR, Est Non African American 55 (L) > OR = 60 mL/min/1.9m2   GFR, Est African American 64 > OR = 60 mL/min/1.71m2   BUN/Creatinine Ratio 12 6 - 22 (calc)   Sodium 141 135 - 146 mmol/L   Potassium 4.2 3.5 - 5.3 mmol/L   Chloride 106 98 - 110 mmol/L   CO2 29 20 - 32 mmol/L   Calcium 9.2 8.6 - 10.3 mg/dL   Total Protein 6.8 6.1 - 8.1 g/dL   Albumin 4.0 3.6 - 5.1 g/dL   Globulin 2.8 1.9 - 3.7 g/dL (calc)   AG Ratio 1.4 1.0 - 2.5 (calc)   Total Bilirubin 0.7 0.2 - 1.2 mg/dL   Alkaline phosphatase (APISO) 73 40 - 115 U/L   AST 26 10 - 35 U/L   ALT 21 9 - 46 U/L  CBC with Differential/Platelet     Status: None   Collection Time: 05/21/17 11:00 AM  Result Value Ref Range   WBC 5.7 3.8 - 10.8 Thousand/uL   RBC 4.41 4.20 - 5.80 Million/uL   Hemoglobin 14.2 13.2 - 17.1 g/dL   HCT 41.9 38.5 - 50.0  %   MCV 95.0 80.0 - 100.0 fL   MCH 32.2 27.0 - 33.0 pg   MCHC 33.9 32.0 - 36.0 g/dL   RDW 11.6 11.0 - 15.0 %   Platelets 157 140 - 400 Thousand/uL   MPV 11.4 7.5 - 12.5 fL   Neutro Abs 3,962 1,500 - 7,800 cells/uL   Lymphs Abs 1,197  850 - 3,900 cells/uL   WBC mixed population 422 200 - 950 cells/uL   Eosinophils Absolute 91 15 - 500 cells/uL   Basophils Absolute 29 0 - 200 cells/uL   Neutrophils Relative % 69.5 %   Total Lymphocyte 21.0 %   Monocytes Relative 7.4 %   Eosinophils Relative 1.6 %   Basophils Relative 0.5 %  Lipid panel     Status: None   Collection Time: 05/21/17 11:00 AM  Result Value Ref Range   Cholesterol 145 <200 mg/dL   HDL 64 >40 mg/dL   Triglycerides 55 <150 mg/dL   LDL Cholesterol (Calc) 67 mg/dL (calc)    Comment: Reference range: <100 . Desirable range <100 mg/dL for primary prevention;   <70 mg/dL for patients with CHD or diabetic patients  with > or = 2 CHD risk factors. Marland Kitchen LDL-C is now calculated using the Martin-Hopkins  calculation, which is a validated novel method providing  better accuracy than the Friedewald equation in the  estimation of LDL-C.  Cresenciano Genre et al. Annamaria Helling. 3149;702(63): 2061-2068  (http://education.[facility].com/faq/FAQ164)    Total CHOL/HDL Ratio 2.3 <5.0 (calc)   Non-HDL Cholesterol (Calc) 81 <130 mg/dL (calc)    Comment: For patients with diabetes plus 1 major ASCVD risk  factor, treating to a non-HDL-C goal of <100 mg/dL  (LDL-C of <70 mg/dL) is considered a therapeutic  option.   VITAMIN D 25 Hydroxy (Vit-D Deficiency, Fractures)     Status: Abnormal   Collection Time: 05/21/17 11:00 AM  Result Value Ref Range   Vit D, 25-Hydroxy 17 (L) 30 - 100 ng/mL    Comment: Vitamin D Status         25-OH Vitamin D: . Deficiency:                    <20 ng/mL Insufficiency:             20 - 29 ng/mL Optimal:                 > or = 30 ng/mL . For 25-OH Vitamin D testing on patients on  D2-supplementation and patients  for whom quantitation  of D2 and D3 fractions is required, the QuestAssureD(TM) 25-OH VIT D, (D2,D3), LC/MS/MS is recommended: order  code [phone] (patients >44yrs). . For more information on this test, go to: http://education.[facility].com/faq/FAQ163 (This link is being provided for  informational/educational purposes only.)   PSA     Status: Abnormal   Collection Time: 05/21/17 11:00 AM  Result Value Ref Range   PSA 4.5 (H) < OR = 4.0 ng/mL    Comment: The total PSA value from this assay system is  standardized against the WHO standard. The test  result will be approximately 20% lower when compared  to the equimolar-standardized total PSA (Beckman  Coulter). Comparison of serial PSA results should be  interpreted with this fact in mind. . This test was performed using the Siemens  chemiluminescent method. Values obtained from  different assay methods cannot be used interchangeably. PSA levels, regardless of value, should not be interpreted as absolute evidence of the presence or absence of disease.   POCT Urinalysis Dipstick     Status: Abnormal   Collection Time: 05/26/17 12:48 PM  Result Value Ref Range   Color, UA yellow     Comment: dark   Clarity - urine clear    Glucose, UA neg    Bilirubin, UA +    Ketones, UA negative  Spec Grav, UA 1.020 1.010 - 1.025   Blood, UA +++    pH, UA 5.0 5.0 - 8.0   Protein, UA negative    Urobilinogen, UA negative (A) 0.2 or 1.0 E.U./dL   Nitrite, UA negative    Leukocytes, UA Negative Negative   Appearance     Odor       PHQ2/9: Depression screen [facility] 2/9 02/19/2017 12/09/2016 05/29/2016 02/12/2016 09/22/2015  Decreased Interest 0 0 0 0 0  Down, Depressed, Hopeless 0 0 0 0 0  PHQ - 2 Score 0 0 0 0 0     Fall Risk: Fall Risk  05/21/2017 02/19/2017 12/09/2016 05/29/2016 02/12/2016  Falls in the past year? No No No No No     Assessment & Plan  1. Gross hematuria  - POCT Urinalysis Dipstick - CULTURE, URINE  COMPREHENSIVE - Ambulatory referral to Urology  Monitor for now, no changes on medication will hold off on antibiotics since he is asymptomatic now.

## 2017-05-26 NOTE — Telephone Encounter (Signed)
Patient's wife is calling to report her husband has blood in urine on Saturday. It started and ended on Saturday. This has happened before - years ago and was investigated and benign. They are concerned and are requesting an appointment. Appointment is requested with PCP only. Appointment given with understanding that if blood in seen they will call and let office know or go to UC- wife states she will make sure they follow instructions. Reason for Disposition . All other urine symptoms . Taking Coumadin (warfarin) or other strong blood thinner, or known bleeding disorder (e.g., thrombocytopenia)  Answer Assessment - Initial Assessment Questions 1. SYMPTOM: "What's the main symptom you're concerned about?" (e.g., frequency, incontinence)     Patient saw blood urine on Saturday 2. ONSET: "When did the  ________  start?"     Saturday- nothing Sunday 3. PAIN: "Is there any pain?" If so, ask: "How bad is it?" (Scale: 1-10; mild, moderate, severe)     No pain 4. CAUSE: "What do you think is causing the symptoms?"     Unknown- patient is on blood thinners 5. OTHER SYMPTOMS: "Do you have any other symptoms?" (e.g., fever, flank pain, blood in urine, pain with urination)     No other symptoms 6. PREGNANCY: "Is there any chance you are pregnant?" "When was your last menstrual period?"     n/a  Answer Assessment - Initial Assessment Questions 1. COLOR of URINE: "Describe the color of the urine."  (e.g., tea-colored, pink, red, blood clots, bloody)     Normal color now- episodic  2. ONSET: "When did the bleeding start?"      saturday 3. EPISODES: "How many times has there been blood in the urine?" or "How many times today?"     Saturday  4. PAIN with URINATION: "Is there any pain with passing your urine?" If so, ask: "How bad is the pain?"  (Scale 1-10; or mild, moderate, severe)    - MILD - complains slightly about urination hurting    - MODERATE - interferes with normal activities      - SEVERE -  excruciating, unwilling or unable to urinate because of the pain      No pain 5. FEVER: "Do you have a fever?" If so, ask: "What is your temperature, how was it measured, and when did it start?"     No fever 6. ASSOCIATED SYMPTOMS: "Are you passing urine more frequently than usual?"     No other symptoms reported 7. OTHER SYMPTOMS: "Do you have any other symptoms?" (e.g., back/flank pain, abdominal pain, vomiting)     no 8. PREGNANCY: "Is there any chance you are pregnant?" "When was your last menstrual period?"     n/a  Protocols used: URINARY SYMPTOMS-A-AH, URINE - BLOOD IN-A-AH

## 2017-05-28 ENCOUNTER — Encounter: Payer: Self-pay | Admitting: Urology

## 2017-05-28 ENCOUNTER — Ambulatory Visit: Payer: Self-pay | Admitting: Family Medicine

## 2017-05-28 ENCOUNTER — Ambulatory Visit: Payer: Medicare PPO | Admitting: Urology

## 2017-05-28 VITALS — BP 155/91 | HR 68 | Ht 70.0 in | Wt 192.2 lb

## 2017-05-28 DIAGNOSIS — R310 Gross hematuria: Secondary | ICD-10-CM | POA: Diagnosis not present

## 2017-05-28 LAB — MICROSCOPIC EXAMINATION
Bacteria, UA: NONE SEEN
Epithelial Cells (non renal): NONE SEEN /hpf (ref 0–10)
WBC, UA: NONE SEEN /hpf (ref 0–?)

## 2017-05-28 LAB — URINALYSIS, COMPLETE
Bilirubin, UA: NEGATIVE
Glucose, UA: NEGATIVE
LEUKOCYTES UA: NEGATIVE
Nitrite, UA: NEGATIVE
PH UA: 5 (ref 5.0–7.5)
Protein, UA: NEGATIVE
SPEC GRAV UA: 1.025 (ref 1.005–1.030)
Urobilinogen, Ur: 0.2 mg/dL (ref 0.2–1.0)

## 2017-05-29 LAB — CULTURE, URINE COMPREHENSIVE
MICRO NUMBER:: 90308075
SPECIMEN QUALITY:: ADEQUATE

## 2017-05-30 ENCOUNTER — Other Ambulatory Visit: Payer: Self-pay | Admitting: Family Medicine

## 2017-05-30 MED ORDER — NITROFURANTOIN MONOHYD MACRO 100 MG PO CAPS: 100.0000 mg | ORAL_CAPSULE | Freq: Two times a day (BID) | ORAL | 0 refills | Status: DC

## 2017-06-02 ENCOUNTER — Encounter: Payer: Self-pay | Admitting: Urology

## 2017-06-02 NOTE — Progress Notes (Signed)
05/28/2017 10:02 AM   [name] January 14, 1936 [id number]  Referring provider: Steele Sizer, MD [address] [place], [place] [address]  Chief Complaint  Patient presents with  . Hematuria    HPI: Domnique Vantine is a 82 year old male seen in consultation at the request of Dr. Ancil Boozer for evaluation of hematuria.  On 05/24/2017 he had onset of total gross hematuria.  The urine was described as light red in color without clots and resolved after 12 hours.  He had mild left low back pain.  He had no bothersome lower urinary tract symptoms including frequency, urgency, dysuria or urinary incontinence.  He is on Pradaxa for history of DVT.    He saw Dr. Louis Meckel in 2016 for an elevated PSA of 6.1.  He had a previous biopsy in 2012 by Dr. Quillian Quince. Based on his age it was recommended to stop checking annual PSAs.  He did have a PSA on 05/21/2017 which was 4.5.   PMH: Past Medical History:  Diagnosis Date  . Arrhythmia   . Arthritis   . Benign positional vertigo   . Deep vein blood clot of right lower extremity ([place])   . Glaucoma   . Hyperlipidemia   . Hypertension   . Neuropathy     Surgical History: Past Surgical History:  Procedure Laterality Date  . NO PAST SURGERIES      Home Medications:  Allergies as of 05/28/2017   No Known Allergies     Medication List        Accurate as of 05/28/17 11:59 PM. Always use your most recent med list.          aspirin 81 MG tablet Take 81 mg by mouth daily.   atorvastatin 20 MG tablet Commonly known as:  LIPITOR TAKE 1 TABLET BY MOUTH ONCE DAILY FOR CHOLESTEROL (IN PLACE OF LOVASTATIN)   dabigatran 150 MG Caps capsule Commonly known as:  PRADAXA Take 1 capsule (150 mg total) by mouth 2 (two) times daily.   galantamine 12 MG tablet Commonly known as:  RAZADYNE Take 1 tablet by mouth daily.   losartan 50 MG tablet Commonly known as:  COZAAR Take 1 tablet (50 mg total) by mouth daily.   tamsulosin 0.4 MG Caps  capsule Commonly known as:  FLOMAX Take 1 capsule (0.4 mg total) by mouth daily.   vitamin B-12 1000 MCG tablet Commonly known as:  CYANOCOBALAMIN Take by mouth.   Vitamin D (Ergocalciferol) 50000 units Caps capsule Commonly known as:  DRISDOL Take 1 capsule (50,000 Units total) by mouth every 7 (seven) days.       Allergies: No Known Allergies  Family History: Family History  Problem Relation Age of Onset  . Coronary artery disease Father   . Stroke Son   . Kidney disease Neg Hx   . Prostate cancer Neg Hx   . Bladder Cancer Neg Hx     Social History:  reports that  has never smoked. he has never used smokeless tobacco. He reports that he drinks alcohol. He reports that he does not use drugs.  ROS: UROLOGY Frequent Urination?: Yes Hard to postpone urination?: Yes Burning/pain with urination?: No Get up at night to urinate?: Yes Leakage of urine?: No Urine stream starts and stops?: Yes Trouble starting stream?: No Do you have to strain to urinate?: No Blood in urine?: No Urinary tract infection?: No Sexually transmitted disease?: No Injury to kidneys or bladder?: No Painful intercourse?: No Weak stream?: No Erection  problems?: No Penile pain?: No  Gastrointestinal Nausea?: No Vomiting?: No Indigestion/heartburn?: No Diarrhea?: No Constipation?: No  Constitutional Fever: No Night sweats?: No Weight loss?: Yes Fatigue?: No  Skin Skin rash/lesions?: No Itching?: No  Eyes Blurred vision?: No Double vision?: No  Ears/Nose/Throat Sore throat?: No Sinus problems?: No  Hematologic/Lymphatic Swollen glands?: No Easy bruising?: No  Cardiovascular Leg swelling?: Yes  Respiratory Cough?: No Shortness of breath?: No  Endocrine Excessive thirst?: Yes  Musculoskeletal Back pain?: No Joint pain?: No  Neurological Headaches?: No Dizziness?: No  Psychologic Depression?: No Anxiety?: No  Physical Exam: BP (!) 155/91 (BP Location: Right  Arm, Patient Position: Sitting, Cuff Size: Normal)   Pulse 68   Ht 5\' 10"  (1.778 m)   Wt 192 lb 3.2 oz (87.2 kg)   BMI 27.58 kg/m   Constitutional:  Alert and oriented, No acute distress. HEENT: [place] AT, moist mucus membranes.  Trachea midline, no masses. Cardiovascular: No clubbing, cyanosis, or edema. Respiratory: Normal respiratory effort, no increased work of breathing. GI: Abdomen is soft, nontender, nondistended, no abdominal masses GU: No CVA tenderness.  Prostate 50 g, smooth without nodules. Lymph: No cervical or inguinal lymphadenopathy. Skin: No rashes, bruises or suspicious lesions. Neurologic: Grossly intact, no focal deficits, moving all 4 extremities. Psychiatric: Normal mood and affect.  Laboratory Data: Lab Results  Component Value Date   WBC 5.7 05/21/2017   HGB 14.2 05/21/2017   HCT 41.9 05/21/2017   MCV 95.0 05/21/2017   PLT 157 05/21/2017    Lab Results  Component Value Date   CREATININE 1.23 (H) 05/21/2017    Lab Results  Component Value Date   PSA 4.5 (H) 05/21/2017   PSA 5.1 (H) 02/12/2016    Lab Results  Component Value Date   HGBA1C 5.6 02/12/2016    Urinalysis Dipstick 2+ blood/microscopy 3-10 RBCs   Assessment & Plan:   82 year old male with an episode of total gross hematuria.  We discussed potential etiologies including both benign and malignant causes.  I recommended a full upper/lower tract evaluation to include CT urogram and cystoscopy.  All questions were answered and he desires to proceed.  1. Gross hematuria  - Urinalysis, Complete - CT HEMATURIA WORKUP; Future   Abbie Sons, MD  [facility] [address], [employer], [place] [address] [phone]

## 2017-06-16 ENCOUNTER — Ambulatory Visit: Payer: Self-pay | Admitting: *Deleted

## 2017-06-16 NOTE — Telephone Encounter (Signed)
Contacted pt's wife, Arlina Robes, and she states that he has vertigo; she states that it started 06/15/17; nurse triage initiated and recommendations made per protocol to include seeing a physician within 3 days; pt normally sees Dr Ancil Boozer but she has to appointments within guidelines per protocol; pt'w sife offered and accepted appointment with Suezanne Cheshire on 06/17/17 at [facility]; spoke with Suanne Marker regarding this appointment; will route to office for notification; additionally the pt's wife would like to know if something can be called in for the pt prior to his appointment? They can be contacted at [phone].  Reason for Disposition . [1] MODERATE dizziness (e.g., vertigo; feels very unsteady, interferes with normal activities) AND [2] has been evaluated by physician for this  Answer Assessment - Initial Assessment Questions 1. DESCRIPTION: "Describe your dizziness."     vertigo 2. VERTIGO: "Do you feel like either you or the room is spinning or tilting?"      Room is moving 3. LIGHTHEADED: "Do you feel lightheaded?" (e.g., somewhat faint, woozy, weak upon standing)     When sanding 4. SEVERITY: "How bad is it?"  "Can you walk?"   - MILD - Feels unsteady but walking normally.   - MODERATE - Feels very unsteady when walking, but not falling; interferes with normal activities (e.g., school, work) .   - SEVERE - Unable to walk without falling (requires assistance).     moderate 5. ONSET:  "When did the dizziness begin?"     06/15/17 6. AGGRAVATING FACTORS: "Does anything make it worse?" (e.g., standing, change in head position)     Standing up 7. CAUSE: "What do you think is causing the dizziness?"     vertigo 8. RECURRENT SYMPTOM: "Have you had dizziness before?" If so, ask: "When was the last time?" "What happened that time?"     Yes, vertigo last year; given meclizine 9. OTHER SYMPTOMS: "Do you have any other symptoms?" (e.g., headache, weakness, numbness, vomiting, earache)     no 10.  PREGNANCY: "Is there any chance you are pregnant?" "When was your last menstrual period?"       n/a  Protocols used: DIZZINESS - VERTIGO-A-AH

## 2017-06-17 ENCOUNTER — Encounter: Payer: Self-pay | Admitting: Nurse Practitioner

## 2017-06-17 ENCOUNTER — Ambulatory Visit (INDEPENDENT_AMBULATORY_CARE_PROVIDER_SITE_OTHER): Payer: Medicare PPO | Admitting: Nurse Practitioner

## 2017-06-17 VITALS — BP 132/76 | HR 62 | Temp 97.8°F | Resp 14 | Wt 189.9 lb

## 2017-06-17 DIAGNOSIS — H8110 Benign paroxysmal vertigo, unspecified ear: Secondary | ICD-10-CM | POA: Diagnosis not present

## 2017-06-17 DIAGNOSIS — I441 Atrioventricular block, second degree: Secondary | ICD-10-CM | POA: Diagnosis not present

## 2017-06-17 DIAGNOSIS — I480 Paroxysmal atrial fibrillation: Secondary | ICD-10-CM | POA: Diagnosis not present

## 2017-06-17 DIAGNOSIS — Z86718 Personal history of other venous thrombosis and embolism: Secondary | ICD-10-CM | POA: Diagnosis not present

## 2017-06-17 MED ORDER — MECLIZINE HCL 25 MG PO TABS
25.0000 mg | ORAL_TABLET | Freq: Three times a day (TID) | ORAL | 0 refills | Status: DC | PRN
Start: 2017-06-17 — End: 2017-09-11

## 2017-06-17 NOTE — Patient Instructions (Signed)
Warning signs of a stroke The symptoms of stroke may vary and will reflect the part of the brain that is involved. Symptoms usually happen suddenly. "BE FAST" is an easy way to remember the main warning signs of a stroke. B - Balance Signs are dizziness, sudden trouble walking, or loss of balance. E - Eyes Signs are trouble seeing or a sudden change in vision. F - Face Signs are sudden weakness or numbness of the face, or the face or eyelid drooping on one side. A - Arms Signs are weakness or numbness in an arm. This happens suddenly and usually on one side of the body. S - Speech Signs are sudden trouble speaking, slurred speech, or trouble understanding what people say. T - Time Time to call emergency services. Write down what time symptoms started. Other signs of a stroke Some less common signs of a stroke include:  A sudden, severe headache with no known cause.  Nausea or vomiting.  Seizure.  A stroke may be happening even if only one "BE FAST" symptoms is present. These symptoms may represent a serious problem that is an emergency. Do not wait to see if the symptoms will go away. Get medical help right away. Call your local emergency services (911 in the [place] Do not drive yourself to the hospital. Summary  A stroke is a medical emergency and should be treated right away-every second counts.  "BE FAST" is an easy way to remember the main warning signs of a stroke.  Call local emergency services right away if you or someone else has any stroke symptoms, even if the symptoms go away.  Make note of what time the first symptoms appeared. Emergency responders or emergency room staff will need to know this information.  Do not wait to see if symptoms will go away. Call 911 even if only one of the "BE FAST" symptoms appears. This information is not intended to replace advice given to you by your health care provider. Make sure you discuss any questions you have with your health  care provider. Document Released: 06/21/2016 Document Revised: 06/21/2016 Document Reviewed: 06/21/2016 Elsevier Interactive Patient Education  2018 [employer].  Vertigo Vertigo is the feeling that you or your surroundings are moving when they are not. Vertigo can be dangerous if it occurs while you are doing something that could endanger you or others, such as driving. What are the causes? This condition is caused by a disturbance in the signals that are sent by your body's sensory systems to your brain. Different causes of a disturbance can lead to vertigo, including:  Infections, especially in the inner ear.  A bad reaction to a drug, or misuse of alcohol and medicines.  Withdrawal from drugs or alcohol.  Quickly changing positions, as when lying down or rolling over in bed.  Migraine headaches.  Decreased blood flow to the brain.  Decreased blood pressure.  Increased pressure in the brain from a head or neck injury, stroke, infection, tumor, or bleeding.  Central nervous system disorders.  What are the signs or symptoms? Symptoms of this condition usually occur when you move your head or your eyes in different directions. Symptoms may start suddenly, and they usually last for less than a minute. Symptoms may include:  Loss of balance and falling.  Feeling like you are spinning or moving.  Feeling like your surroundings are spinning or moving.  Nausea and vomiting.  Blurred vision or double vision.  Difficulty hearing.  Slurred speech.  Dizziness.  Involuntary eye movement (nystagmus).  Symptoms can be mild and cause only slight annoyance, or they can be severe and interfere with daily life. Episodes of vertigo may return (recur) over time, and they are often triggered by certain movements. Symptoms may improve over time. How is this diagnosed? This condition may be diagnosed based on medical history and the quality of your nystagmus. Your health care provider  may test your eye movements by asking you to quickly change positions to trigger the nystagmus. This may be called the Dix-Hallpike test, head thrust test, or roll test. You may be referred to a health care provider who specializes in ear, nose, and throat (ENT) problems (otolaryngologist) or a provider who specializes in disorders of the central nervous system (neurologist). You may have additional testing, including:  A physical exam.  Blood tests.  MRI.  A CT scan.  An electrocardiogram (ECG). This records electrical activity in your heart.  An electroencephalogram (EEG). This records electrical activity in your brain.  Hearing tests.  How is this treated? Treatment for this condition depends on the cause and the severity of the symptoms. Treatment options include:  Medicines to treat nausea or vertigo. These are usually used for severe cases. Some medicines that are used to treat other conditions may also reduce or eliminate vertigo symptoms. These include: ? Medicines that control allergies (antihistamines). ? Medicines that control seizures (anticonvulsants). ? Medicines that relieve depression (antidepressants). ? Medicines that relieve anxiety (sedatives).  Head movements to adjust your inner ear back to normal. If your vertigo is caused by an ear problem, your health care provider may recommend certain movements to correct the problem.  Surgery. This is rare.  Follow these instructions at home: Safety  Move slowly.Avoid sudden body or head movements.  Avoid driving.  Avoid operating heavy machinery.  Avoid doing any tasks that would cause danger to you or others if you would have a vertigo episode during the task.  If you have trouble walking or keeping your balance, try using a cane for stability. If you feel dizzy or unstable, sit down right away.  Return to your normal activities as told by your health care provider. Ask your health care provider what activities  are safe for you. General instructions  Take over-the-counter and prescription medicines only as told by your health care provider.  Avoid certain positions or movements as told by your health care provider.  Drink enough fluid to keep your urine clear or pale yellow.  Keep all follow-up visits as told by your health care provider. This is important. Contact a health care provider if:  Your medicines do not relieve your vertigo or they make it worse.  You have a fever.  Your condition gets worse or you develop new symptoms.  Your family or friends notice any behavioral changes.  Your nausea or vomiting gets worse.  You have numbness or a "pins and needles" sensation in part of your body. Get help right away if:  You have difficulty moving or speaking.  You are always dizzy.  You faint.  You develop severe headaches.  You have weakness in your hands, arms, or legs.  You have changes in your hearing or vision.  You develop a stiff neck.  You develop sensitivity to light. This information is not intended to replace advice given to you by your health care provider. Make sure you discuss any questions you have with your health care provider. Document Released: 12/12/2004 Document Revised: 08/16/2015  Document Reviewed: 06/27/2014 Elsevier Interactive Patient Education  [employer].

## 2017-06-17 NOTE — Progress Notes (Addendum)
Name: Ronald Fleming   MRN: [id number]    DOB: 22-Jul-1935   Date:06/17/2017       Progress Note  Subjective  Chief Complaint  Chief Complaint  Patient presents with  . Dizziness    has a history of this started sunday    HPI Patient states woke up Sunday morning and got out of bed and had dizziness and was off balance- had to stabilize himself with the wall. He states when he got out of bed he felt dizzy. States it is better when he is walking around. States has had to sit a lot due to this dizziness. He denies feeling like the world was spinning around him and lightheadedness. Patient states change in position. Or eyes open/close does not change dizziness; seems to be relatively constant- but does not have it presently. Patient states has had similar episode- with constant dizziness that was resolved with three days of meclizine and dx as vertigo. Almost fell yesterday due to dizziness but did not- caught himself on wife; no injuries.   Denies nausea, slurred speech, weakness, paresthesias, vision changes, facial droop, denies head trauma, palpitations, chest pain, shortness of breath. Takes 81mg  ASA daily. Patient has a.fib- on Pradaxa BID follows up with [facility] MD- cardiology.    Of Note: wife notes patient has been having weight loss in the last month. Patient states he tries to be outside a lot so he doesn't eat a lot but states when he stays in the house he eats a lot. States has BM 2-3 times a day- no diarrhea. No blood in his stools.    Patient Active Problem List   Diagnosis Date Noted  . Osteopenia 05/21/2017  . Atrioventricular block, second degree 05/29/2016  . Vitamin B12 deficiency 05/21/2016  . Late onset Alzheimer's disease without behavioral disturbance 05/21/2016  . Vitiligo 02/12/2016  . History of DVT of lower extremity 08/03/2015  . BPH (benign prostatic hyperplasia) 08/03/2015  . Atrial fibrillation ([place]) 02/01/2015  . Benign paroxysmal positional nystagmus  08/23/2014  . Edema leg 08/23/2014  . Chronic kidney disease, stage III (moderate) ([place]) 08/23/2014  . Neuropathy 08/23/2014  . Benign essential HTN 08/27/2006  . Hypercholesterolemia without hypertriglyceridemia 08/27/2006    Social History   Tobacco Use  . Smoking status: Never Smoker  . Smokeless tobacco: Never Used  Substance Use Topics  . Alcohol use: Yes    Alcohol/week: 0.0 oz    Comment: occasionally     Current Outpatient Medications:  .  aspirin 81 MG tablet, Take 81 mg by mouth daily., Disp: , Rfl:  .  atorvastatin (LIPITOR) 20 MG tablet, TAKE 1 TABLET BY MOUTH ONCE DAILY FOR CHOLESTEROL (IN PLACE OF LOVASTATIN), Disp: 90 tablet, Rfl: 1 .  dabigatran (PRADAXA) 150 MG CAPS capsule, Take 1 capsule (150 mg total) by mouth 2 (two) times daily., Disp: 60 capsule, Rfl: 5 .  galantamine (RAZADYNE) 12 MG tablet, Take 1 tablet by mouth daily., Disp: , Rfl:  .  losartan (COZAAR) 50 MG tablet, Take 1 tablet (50 mg total) by mouth daily., Disp: 90 tablet, Rfl: 0 .  vitamin B-12 (CYANOCOBALAMIN) 1000 MCG tablet, Take by mouth., Disp: , Rfl:  .  Vitamin D, Ergocalciferol, (DRISDOL) 50000 units CAPS capsule, Take 1 capsule (50,000 Units total) by mouth every 7 (seven) days., Disp: 12 capsule, Rfl: 0 .  meclizine (ANTIVERT) 25 MG tablet, Take 1 tablet (25 mg total) by mouth 3 (three) times daily as needed for dizziness., Disp: 30 tablet,  Rfl: 0  No Known Allergies  ROS  Constitutional: Negative for fever Postive weight loss 4 lbs in 3 weeks.  Respiratory: Negative for cough and shortness of breath.   Cardiovascular: Negative for chest pain or palpitations.  Gastrointestinal: Negative for abdominal pain, no bowel changes.  Musculoskeletal: Positive for gait problem- off balance Negative joint swelling.  Skin: Negative for rash.  Neurological: Positivefor dizziness Negative headache.  No other specific complaints in a complete review of systems (except as listed in HPI  above).  Objective  Vitals:   06/17/17 0836  BP: 132/76  Pulse: 62  Resp: 14  Temp: 97.8 F (36.6 C)  TempSrc: Oral  SpO2: 95%  Weight: 189 lb 14.4 oz (86.1 kg)     Body mass index is 27.25 kg/m.  Nursing Note and Vital Signs reviewed.  Physical Exam  Constitutional: Patient appears well-developed and well-nourished. * No distress.  HEENT: head atraumatic, normocephalic, pupils equal and reactive to light, TM's without erythema or bulging,  oropharynx pink and moist without exudate, no nasal discharge Cardiovascular: Normal rate, regular rhythm, S1/S2 present.  No murmur or rub heard. Pulses present and regular  Pulmonary/Chest: Effort normal and breath sounds clear. No respiratory distress or retractions. Neurological: EOMs intact, no nystagmus noted hand grip- strong and equal, sensation intact, no slurred speech, - epley; + romberg Psychiatric: Patient has a normal mood and affect. behavior is normal. Judgment and thought content normal.   Assessment & Plan  1. Benign paroxysmal positional vertigo, unspecified laterality - meclizine, discussed consult to ENT and CT- share decision making- will hold since symptoms same as previous and resolved with meclizine will call with unrelieved, new or worsening symptoms. Stroke symptoms discussed.   2. Paroxysmal atrial fibrillation (HCC) - No palpitations, shob, cp, or leg  Swelling; regular rate and rhythm auscultated and palpated; patient on pradaxa and daily ASA.  4. History of DVT of lower extremity - No leg pain or swelling; patient on pradaxa and daily ASA  5. Atrioventricular block, second degree -  regular rate and rhythm auscultated and palpated; good perfusion   -Red flags and when to present for emergency care or RTC including fever >101.13F, chest pain, shortness of breath, new/worsening/un-resolving symptoms, stroke symptoms reviewed with patient at time of visit. Follow up and care instructions discussed and  provided in AVS.  Face-to-face time with patient was more than 25 minutes, >50% time spent education, counseling and coordination of care  I have reviewed this encounter including the documentation in this note and/or discussed this patient with the provider, Suezanne Cheshire DNP AGNP-C. I am certifying that I agree with the content of this note as supervising physician. Steele Sizer, MD [facility] 06/17/2017, 1:06 PM

## 2017-06-18 ENCOUNTER — Ambulatory Visit
Admission: RE | Admit: 2017-06-18 | Discharge: 2017-06-18 | Disposition: A | Discharge status: Home / Self Care | Payer: Medicare PPO | Source: Ambulatory Visit | Attending: Urology | Admitting: Urology

## 2017-06-18 DIAGNOSIS — R918 Other nonspecific abnormal finding of lung field: Secondary | ICD-10-CM | POA: Insufficient documentation

## 2017-06-18 DIAGNOSIS — K5730 Diverticulosis of large intestine without perforation or abscess without bleeding: Secondary | ICD-10-CM | POA: Diagnosis not present

## 2017-06-18 DIAGNOSIS — M4328 Fusion of spine, sacral and sacrococcygeal region: Secondary | ICD-10-CM | POA: Diagnosis not present

## 2017-06-18 DIAGNOSIS — R310 Gross hematuria: Secondary | ICD-10-CM

## 2017-06-18 DIAGNOSIS — N289 Disorder of kidney and ureter, unspecified: Secondary | ICD-10-CM | POA: Diagnosis not present

## 2017-06-18 DIAGNOSIS — N400 Enlarged prostate without lower urinary tract symptoms: Secondary | ICD-10-CM | POA: Diagnosis not present

## 2017-06-18 DIAGNOSIS — N200 Calculus of kidney: Secondary | ICD-10-CM | POA: Insufficient documentation

## 2017-06-18 DIAGNOSIS — I700 Atherosclerosis of aorta: Secondary | ICD-10-CM | POA: Insufficient documentation

## 2017-06-18 DIAGNOSIS — M5136 Other intervertebral disc degeneration, lumbar region: Secondary | ICD-10-CM | POA: Insufficient documentation

## 2017-06-18 DIAGNOSIS — K4090 Unilateral inguinal hernia, without obstruction or gangrene, not specified as recurrent: Secondary | ICD-10-CM | POA: Insufficient documentation

## 2017-06-18 MED ORDER — IOPAMIDOL (ISOVUE-300) INJECTION 61%
125.0000 mL | Freq: Once | INTRAVENOUS | Status: AC | PRN
  Administered 2017-06-18: 125 mL via INTRAVENOUS

## 2017-06-18 MED ORDER — IOHEXOL 300 MG/ML  SOLN: 125.0000 mL | Freq: Once | INTRAMUSCULAR | Status: DC | PRN

## 2017-06-25 ENCOUNTER — Encounter: Payer: Self-pay | Admitting: Urology

## 2017-06-25 ENCOUNTER — Ambulatory Visit: Payer: Medicare PPO | Admitting: Urology

## 2017-06-25 VITALS — BP 162/89 | HR 72 | Resp 16 | Ht 71.0 in | Wt 193.0 lb

## 2017-06-25 DIAGNOSIS — R310 Gross hematuria: Secondary | ICD-10-CM

## 2017-06-25 DIAGNOSIS — K862 Cyst of pancreas: Secondary | ICD-10-CM

## 2017-06-25 DIAGNOSIS — N2889 Other specified disorders of kidney and ureter: Secondary | ICD-10-CM

## 2017-06-25 LAB — MICROSCOPIC EXAMINATION

## 2017-06-25 LAB — URINALYSIS, COMPLETE
BILIRUBIN UA: NEGATIVE
Glucose, UA: NEGATIVE
Ketones, UA: NEGATIVE
Leukocytes, UA: NEGATIVE
NITRITE UA: NEGATIVE
PH UA: 5 (ref 5.0–7.5)
Protein, UA: NEGATIVE
Specific Gravity, UA: 1.02 (ref 1.005–1.030)
Urobilinogen, Ur: 0.2 mg/dL (ref 0.2–1.0)

## 2017-06-25 MED ORDER — LIDOCAINE HCL 2 % EX GEL
1.0000 "application " | Freq: Once | CUTANEOUS | Status: AC
  Administered 2017-06-25: 1 via URETHRAL

## 2017-06-25 MED ORDER — CIPROFLOXACIN HCL 500 MG PO TABS
500.0000 mg | ORAL_TABLET | Freq: Once | ORAL | Status: AC
  Administered 2017-06-25: 500 mg via ORAL

## 2017-06-25 NOTE — Progress Notes (Signed)
   06/25/17  CC: No chief complaint on file.   HPI: Seen on 05/28/2017 after an isolated episode of total gross painless hematuria.  He is on chronic anticoagulation.  He denies recurrent hematuria.  CTU performed on 06/18/2017 showed bilateral, nonobstructing renal calculi.  There were 2 lesions of the right kidney with possible enhancement for which a renal protocol MRI was recommended.  There was also a 1 cm fluid density in the upper pancreatic body felt to represent either a postinflammatory cystic lesion or intraductal papillary mucinous neoplasm.  MRI was also recommended for further evaluation.  Blood pressure (!) 162/89, pulse 72, resp. rate 16, height 5\' 11"  (1.803 m), weight 193 lb (87.5 kg), SpO2 98 %. NED. A&Ox3.   No respiratory distress   Abd soft, NT, ND Normal phallus with bilateral descended testicles  Cystoscopy Procedure Note  Patient identification was confirmed, informed consent was obtained, and patient was prepped using Betadine solution.  Lidocaine jelly was administered per urethral meatus.    Preoperative abx where received prior to procedure.     Pre-Procedure: - Inspection reveals a normal caliber ureteral meatus.  Procedure: The flexible cystoscope was introduced without difficulty - No urethral strictures/lesions are present. - Lateral lobe enlargement with prominent hypervascularity prostate  - Moderate bladder neck elevation - Bilateral ureteral orifices identified - Bladder mucosa  reveals no ulcers, tumors, or lesions - No bladder stones -Mild to moderate trabeculation  Retroflexion shows no intravesical median lobe   Post-Procedure: - Patient tolerated the procedure well  Assessment/ Plan: The most likely source of his gross hematuria was from the prostate.  He does have renal and pancreatic abnormalities noted on CT urogram and will schedule an MRI of the abdomen with and without contrast with a follow-up visit.

## 2017-07-01 ENCOUNTER — Encounter: Payer: Self-pay | Admitting: Family Medicine

## 2017-07-01 DIAGNOSIS — I700 Atherosclerosis of aorta: Secondary | ICD-10-CM | POA: Insufficient documentation

## 2017-07-03 ENCOUNTER — Ambulatory Visit
Admission: RE | Admit: 2017-07-03 | Discharge: 2017-07-03 | Disposition: A | Discharge status: Home / Self Care | Payer: Medicare PPO | Source: Ambulatory Visit | Attending: Urology | Admitting: Urology

## 2017-07-03 DIAGNOSIS — K8689 Other specified diseases of pancreas: Secondary | ICD-10-CM | POA: Diagnosis not present

## 2017-07-03 DIAGNOSIS — N2889 Other specified disorders of kidney and ureter: Secondary | ICD-10-CM

## 2017-07-03 DIAGNOSIS — K862 Cyst of pancreas: Secondary | ICD-10-CM | POA: Diagnosis not present

## 2017-07-03 MED ORDER — GADOBENATE DIMEGLUMINE 529 MG/ML IV SOLN
18.0000 mL | Freq: Once | INTRAVENOUS | Status: AC | PRN
  Administered 2017-07-03: 18 mL via INTRAVENOUS

## 2017-07-07 ENCOUNTER — Telehealth: Payer: Self-pay

## 2017-07-07 NOTE — Telephone Encounter (Signed)
Patient notified

## 2017-07-07 NOTE — Telephone Encounter (Signed)
-----   Message from Abbie Sons, MD sent at 07/06/2017 10:10 AM EDT ----- MRI showed the renal lesions to be simple cysts and no further evaluation is needed.  The small area in the pancreas is not suspicious however radiology recommended a follow-up MRI in 1 year.  Will for the report to Dr. Ancil Boozer.

## 2017-07-09 ENCOUNTER — Encounter: Payer: Self-pay | Admitting: Family Medicine

## 2017-07-09 DIAGNOSIS — K869 Disease of pancreas, unspecified: Secondary | ICD-10-CM | POA: Insufficient documentation

## 2017-07-17 ENCOUNTER — Encounter: Payer: Self-pay | Admitting: Family Medicine

## 2017-07-17 ENCOUNTER — Ambulatory Visit: Payer: Medicare PPO | Admitting: Family Medicine

## 2017-07-17 VITALS — BP 110/60 | HR 64 | Resp 16 | Ht 71.0 in | Wt 190.5 lb

## 2017-07-17 DIAGNOSIS — I80201 Phlebitis and thrombophlebitis of unspecified deep vessels of right lower extremity: Secondary | ICD-10-CM | POA: Diagnosis not present

## 2017-07-17 DIAGNOSIS — K4090 Unilateral inguinal hernia, without obstruction or gangrene, not specified as recurrent: Secondary | ICD-10-CM

## 2017-07-17 NOTE — Patient Instructions (Signed)

## 2017-07-17 NOTE — Progress Notes (Signed)
Name: Ronald Fleming   MRN: [id number]    DOB: [birth year]   Date:07/17/2017       Progress Note  Subjective  Chief Complaint  Chief Complaint  Patient presents with  . Inguinal Hernia    HPI  Inguinal hernia: he noticed a bulging on right groin a couple of weeks ago, no redness, it changes in size, no change in bowel movement, fever or chills. He states that after mowing his yard yesterday he noticed some stinging on the area. Discussed [facility] if pain, redness or inability to reduce  Patient Active Problem List   Diagnosis Date Noted  . Lesion of pancreas 07/09/2017  . Atherosclerosis of aorta ([place]) 07/01/2017  . Osteopenia 05/21/2017  . Atrioventricular block, second degree 05/29/2016  . Vitamin B12 deficiency 05/21/2016  . Late onset Alzheimer's disease without behavioral disturbance 05/21/2016  . Vitiligo 02/12/2016  . History of DVT of lower extremity 08/03/2015  . BPH (benign prostatic hyperplasia) 08/03/2015  . Atrial fibrillation ([place]) 02/01/2015  . Benign paroxysmal positional nystagmus 08/23/2014  . Edema leg 08/23/2014  . Chronic kidney disease, stage III (moderate) ([place]) 08/23/2014  . Neuropathy 08/23/2014  . Benign essential HTN 08/27/2006  . Hypercholesterolemia without hypertriglyceridemia 08/27/2006    Social History   Tobacco Use  . Smoking status: Never Smoker  . Smokeless tobacco: Never Used  Substance Use Topics  . Alcohol use: Yes    Alcohol/week: 0.0 oz    Comment: occasionally     Current Outpatient Medications:  .  aspirin 81 MG tablet, Take 81 mg by mouth daily., Disp: , Rfl:  .  atorvastatin (LIPITOR) 20 MG tablet, TAKE 1 TABLET BY MOUTH ONCE DAILY FOR CHOLESTEROL (IN PLACE OF LOVASTATIN), Disp: 90 tablet, Rfl: 1 .  dabigatran (PRADAXA) 150 MG CAPS capsule, Take 1 capsule (150 mg total) by mouth 2 (two) times daily., Disp: 60 capsule, Rfl: 5 .  losartan (COZAAR) 50 MG tablet, Take 1 tablet (50 mg total) by mouth daily., Disp: 90  tablet, Rfl: 0 .  vitamin B-12 (CYANOCOBALAMIN) 1000 MCG tablet, Take by mouth., Disp: , Rfl:  .  Vitamin D, Ergocalciferol, (DRISDOL) 50000 units CAPS capsule, Take 1 capsule (50,000 Units total) by mouth every 7 (seven) days., Disp: 12 capsule, Rfl: 0 .  galantamine (RAZADYNE) 12 MG tablet, Take 1 tablet by mouth daily., Disp: , Rfl:  .  meclizine (ANTIVERT) 25 MG tablet, Take 1 tablet (25 mg total) by mouth 3 (three) times daily as needed for dizziness. (Patient not taking: Reported on 07/17/2017), Disp: 30 tablet, Rfl: 0  No Known Allergies  ROS  Ten systems reviewed and is negative except as mentioned in HPI  Objective  Vitals:   07/17/17 1300  BP: 110/60  Pulse: 64  Resp: 16  SpO2: 97%  Weight: 190 lb 8 oz (86.4 kg)  Height: 5\' 11"  (1.803 m)    Body mass index is 26.57 kg/m.    Physical Exam   Constitutional: Patient appears well-developed and well-nourished. Overweight.No distress.  HEENT: head atraumatic, normocephalic, pupils equal and reactive to light, neck supple, throat within normal limits Cardiovascular: Normal rate, regular rhythm and normal heart sounds.  No murmur heard. trace right lower  Edema, no calf tenderness  Pulmonary/Chest: Effort normal and breath sounds normal. No respiratory distress. Abdominal: Soft.  There is no tenderness. Psychiatric: Patient has a normal mood and affect. behavior is normal. Judgment and thought content normal.  Recent Results (from the past 2160  hour(s))  Vitamin B12     Status: None   Collection Time: 05/21/17 11:00 AM  Result Value Ref Range   Vitamin B-12 595 200 - 1,100 pg/mL  COMPLETE METABOLIC PANEL WITH GFR     Status: Abnormal   Collection Time: 05/21/17 11:00 AM  Result Value Ref Range   Glucose, Bld 89 65 - 139 mg/dL    Comment: .        Non-fasting reference interval .    BUN 15 7 - 25 mg/dL   Creat 1.23 (H) 0.70 - 1.18 mg/dL    Comment: For patients >57 years of age, the reference limit for Creatinine  is approximately 13% higher for people identified as African-American. .    GFR, Est Non African American 55 (L) > OR = 60 mL/min/1.65m2   GFR, Est African American 64 > OR = 60 mL/min/1.36m2   BUN/Creatinine Ratio 12 6 - 22 (calc)   Sodium 141 135 - 146 mmol/L   Potassium 4.2 3.5 - 5.3 mmol/L   Chloride 106 98 - 110 mmol/L   CO2 29 20 - 32 mmol/L   Calcium 9.2 8.6 - 10.3 mg/dL   Total Protein 6.8 6.1 - 8.1 g/dL   Albumin 4.0 3.6 - 5.1 g/dL   Globulin 2.8 1.9 - 3.7 g/dL (calc)   AG Ratio 1.4 1.0 - 2.5 (calc)   Total Bilirubin 0.7 0.2 - 1.2 mg/dL   Alkaline phosphatase (APISO) 73 40 - 115 U/L   AST 26 10 - 35 U/L   ALT 21 9 - 46 U/L  CBC with Differential/Platelet     Status: None   Collection Time: 05/21/17 11:00 AM  Result Value Ref Range   WBC 5.7 3.8 - 10.8 Thousand/uL   RBC 4.41 4.20 - 5.80 Million/uL   Hemoglobin 14.2 13.2 - 17.1 g/dL   HCT 41.9 38.5 - 50.0 %   MCV 95.0 80.0 - 100.0 fL   MCH 32.2 27.0 - 33.0 pg   MCHC 33.9 32.0 - 36.0 g/dL   RDW 11.6 11.0 - 15.0 %   Platelets 157 140 - 400 Thousand/uL   MPV 11.4 7.5 - 12.5 fL   Neutro Abs 3,962 1,500 - 7,800 cells/uL   Lymphs Abs 1,197 850 - 3,900 cells/uL   WBC mixed population 422 200 - 950 cells/uL   Eosinophils Absolute 91 15 - 500 cells/uL   Basophils Absolute 29 0 - 200 cells/uL   Neutrophils Relative % 69.5 %   Total Lymphocyte 21.0 %   Monocytes Relative 7.4 %   Eosinophils Relative 1.6 %   Basophils Relative 0.5 %  Lipid panel     Status: None   Collection Time: 05/21/17 11:00 AM  Result Value Ref Range   Cholesterol 145 <200 mg/dL   HDL 64 >40 mg/dL   Triglycerides 55 <150 mg/dL   LDL Cholesterol (Calc) 67 mg/dL (calc)    Comment: Reference range: <100 . Desirable range <100 mg/dL for primary prevention;   <70 mg/dL for patients with CHD or diabetic patients  with > or = 2 CHD risk factors. Marland Kitchen LDL-C is now calculated using the Martin-Hopkins  calculation, which is a validated novel method  providing  better accuracy than the Friedewald equation in the  estimation of LDL-C.  Cresenciano Genre et al. Annamaria Helling. 6578;469(62): 2061-2068  (http://education.[facility].com/faq/FAQ164)    Total CHOL/HDL Ratio 2.3 <5.0 (calc)   Non-HDL Cholesterol (Calc) 81 <130 mg/dL (calc)    Comment: For patients with diabetes plus 1  major ASCVD risk  factor, treating to a non-HDL-C goal of <100 mg/dL  (LDL-C of <70 mg/dL) is considered a therapeutic  option.   VITAMIN D 25 Hydroxy (Vit-D Deficiency, Fractures)     Status: Abnormal   Collection Time: 05/21/17 11:00 AM  Result Value Ref Range   Vit D, 25-Hydroxy 17 (L) 30 - 100 ng/mL    Comment: Vitamin D Status         25-OH Vitamin D: . Deficiency:                    <20 ng/mL Insufficiency:             20 - 29 ng/mL Optimal:                 > or = 30 ng/mL . For 25-OH Vitamin D testing on patients on  D2-supplementation and patients for whom quantitation  of D2 and D3 fractions is required, the QuestAssureD(TM) 25-OH VIT D, (D2,D3), LC/MS/MS is recommended: order  code [phone] (patients >17yrs). . For more information on this test, go to: http://education.[facility].com/faq/FAQ163 (This link is being provided for  informational/educational purposes only.)   PSA     Status: Abnormal   Collection Time: 05/21/17 11:00 AM  Result Value Ref Range   PSA 4.5 (H) < OR = 4.0 ng/mL    Comment: The total PSA value from this assay system is  standardized against the WHO standard. The test  result will be approximately 20% lower when compared  to the equimolar-standardized total PSA (Beckman  Coulter). Comparison of serial PSA results should be  interpreted with this fact in mind. . This test was performed using the Siemens  chemiluminescent method. Values obtained from  different assay methods cannot be used interchangeably. PSA levels, regardless of value, should not be interpreted as absolute evidence of the presence or absence of  disease.   POCT Urinalysis Dipstick     Status: Abnormal   Collection Time: 05/26/17 12:48 PM  Result Value Ref Range   Color, UA yellow     Comment: dark   Clarity - urine clear    Glucose, UA neg    Bilirubin, UA +    Ketones, UA negative    Spec Grav, UA 1.020 1.010 - 1.025   Blood, UA +++    pH, UA 5.0 5.0 - 8.0   Protein, UA negative    Urobilinogen, UA negative (A) 0.2 or 1.0 E.U./dL   Nitrite, UA negative    Leukocytes, UA Negative Negative   Appearance     Odor    CULTURE, URINE COMPREHENSIVE     Status: Abnormal   Collection Time: 05/26/17  1:53 PM  Result Value Ref Range   MICRO NUMBER: [id number]    SPECIMEN QUALITY: ADEQUATE    Source OTHER (SPECIFY)    STATUS: FINAL    ISOLATE 1: Genus Enterococcus (A)     Comment: 1,000-10,000 CFU/mL of Enterococcus species      Susceptibility   Genus enterococcus - CULT, URN, SPECIAL POSITIVE 1    AMPICILLIN <=2 Sensitive     VANCOMYCIN 2 Sensitive     NITROFURANTOIN* <=16 Sensitive      * Legend:S = Susceptible  I = IntermediateR = Resistant  NS = Not susceptible* = Not tested  NR = Not reported**NN = See antimicrobic comments  Urinalysis, Complete     Status: Abnormal   Collection Time: 05/28/17 10:00 AM  Result Value Ref Range  Specific Gravity, UA 1.025 1.005 - 1.030   pH, UA 5.0 5.0 - 7.5   Color, UA Yellow Yellow   Appearance Ur Clear Clear   Leukocytes, UA Negative Negative   Protein, UA Negative Negative/Trace   Glucose, UA Negative Negative   Ketones, UA Trace (A) Negative   RBC, UA 2+ (A) Negative   Bilirubin, UA Negative Negative   Urobilinogen, Ur 0.2 0.2 - 1.0 mg/dL   Nitrite, UA Negative Negative   Microscopic Examination See below:   Microscopic Examination     Status: Abnormal   Collection Time: 05/28/17 10:00 AM  Result Value Ref Range   WBC, UA None seen 0 - 5 /hpf   RBC, UA 3-10 (A) 0 - 2 /hpf   Epithelial Cells (non renal) None seen 0 - 10 /hpf   Mucus, UA Present (A) Not Estab.   Bacteria,  UA None seen None seen/Few  Urinalysis, Complete     Status: None   Collection Time: 06/25/17 10:49 AM  Result Value Ref Range   Specific Gravity, UA 1.020 1.005 - 1.030   pH, UA 5.0 5.0 - 7.5   Color, UA Yellow Yellow   Appearance Ur Clear Clear   Leukocytes, UA Negative Negative   Protein, UA Negative Negative/Trace   Glucose, UA Negative Negative   Ketones, UA Negative Negative   RBC, UA Trace Negative   Bilirubin, UA Negative Negative   Urobilinogen, Ur 0.2 0.2 - 1.0 mg/dL   Nitrite, UA Negative Negative   Microscopic Examination See below:   Microscopic Examination     Status: Abnormal   Collection Time: 06/25/17 10:49 AM  Result Value Ref Range   WBC, UA 0-5 0 - 5 /hpf   RBC, UA 0-2 0 - 2 /hpf   Epithelial Cells (non renal) 0-10 0 - 10 /hpf   Bacteria, UA Few (A) None seen/Few     Assessment & Plan  1. Inguinal hernia of right side without obstruction or gangrene  - Ambulatory referral to General Surgery - he wants to go to [facility]  2. Phlebitis and thrombophlebitis of deep vessels of lower extremities, right (HCC)  On Pradaxa for years because of chronic symptoms, may stop prior to surgery if necessary and resume afterwards.

## 2017-07-21 ENCOUNTER — Telehealth: Payer: Self-pay | Admitting: Family Medicine

## 2017-07-21 DIAGNOSIS — I10 Essential (primary) hypertension: Secondary | ICD-10-CM | POA: Diagnosis not present

## 2017-07-21 DIAGNOSIS — M1710 Unilateral primary osteoarthritis, unspecified knee: Secondary | ICD-10-CM | POA: Diagnosis not present

## 2017-07-21 DIAGNOSIS — N400 Enlarged prostate without lower urinary tract symptoms: Secondary | ICD-10-CM | POA: Diagnosis not present

## 2017-07-21 DIAGNOSIS — E559 Vitamin D deficiency, unspecified: Secondary | ICD-10-CM | POA: Diagnosis not present

## 2017-07-21 DIAGNOSIS — Z6824 Body mass index (BMI) 24.0-24.9, adult: Secondary | ICD-10-CM | POA: Diagnosis not present

## 2017-07-21 DIAGNOSIS — R413 Other amnesia: Secondary | ICD-10-CM | POA: Diagnosis not present

## 2017-07-21 DIAGNOSIS — E785 Hyperlipidemia, unspecified: Secondary | ICD-10-CM | POA: Diagnosis not present

## 2017-07-21 DIAGNOSIS — I4891 Unspecified atrial fibrillation: Secondary | ICD-10-CM | POA: Diagnosis not present

## 2017-07-21 DIAGNOSIS — H269 Unspecified cataract: Secondary | ICD-10-CM | POA: Diagnosis not present

## 2017-07-21 NOTE — Telephone Encounter (Signed)
Copied from [place] [id number]. Topic: Quick Communication - See Telephone Encounter >> Jul 21, 2017 10:28 AM Robina Ade, Helene Kelp D wrote: CRM for notification. See Telephone encounter for: 07/21/17. Patient wife Arlina Robes would like to talk to Dr. Ancil Boozer or her CMA about patient's hernia he has and about him having surgery. Please call patients wife back, thanks.

## 2017-07-21 NOTE — Telephone Encounter (Signed)
Usually outpatient procedure. Goes home on the same day

## 2017-07-21 NOTE — Telephone Encounter (Signed)
Wife is concerned about how long he will be down from surgery and would like referral update. Informed Mrs. Minish her referral was sent and gave her the phone number to call to check on the status. But wanted me to ask Dr. Ancil Boozer about recovery as well.

## 2017-07-22 NOTE — Telephone Encounter (Signed)
Called and left a message with Ronald Fleming about his upcoming surgery and this is an outpatient procedure and usually goes home the same day.

## 2017-08-01 ENCOUNTER — Telehealth: Payer: Self-pay

## 2017-08-01 NOTE — Telephone Encounter (Signed)
Copied from [place] [id number]. Topic: Referral - Question >> Aug 01, 2017 10:07 AM Synthia Innocent wrote: Reason for CRM: Would like a referral to a different surgeon, having a terrible time getting in contact with Dr Sanjuan Dame. Please advise   Referral has been changed to [facility] - [place] location.

## 2017-08-18 ENCOUNTER — Ambulatory Visit: Payer: Medicare PPO | Admitting: Family Medicine

## 2017-09-11 ENCOUNTER — Ambulatory Visit: Payer: Medicare PPO | Admitting: Family Medicine

## 2017-09-11 ENCOUNTER — Encounter: Payer: Self-pay | Admitting: Family Medicine

## 2017-09-11 VITALS — BP 130/80 | HR 61 | Resp 16 | Ht 71.0 in | Wt 192.4 lb

## 2017-09-11 DIAGNOSIS — I482 Chronic atrial fibrillation, unspecified: Secondary | ICD-10-CM

## 2017-09-11 DIAGNOSIS — M51379 Other intervertebral disc degeneration, lumbosacral region without mention of lumbar back pain or lower extremity pain: Secondary | ICD-10-CM | POA: Insufficient documentation

## 2017-09-11 DIAGNOSIS — M5137 Other intervertebral disc degeneration, lumbosacral region: Secondary | ICD-10-CM

## 2017-09-11 DIAGNOSIS — E7800 Pure hypercholesterolemia, unspecified: Secondary | ICD-10-CM | POA: Diagnosis not present

## 2017-09-11 DIAGNOSIS — N182 Chronic kidney disease, stage 2 (mild): Secondary | ICD-10-CM | POA: Diagnosis not present

## 2017-09-11 DIAGNOSIS — K869 Disease of pancreas, unspecified: Secondary | ICD-10-CM

## 2017-09-11 DIAGNOSIS — E538 Deficiency of other specified B group vitamins: Secondary | ICD-10-CM | POA: Diagnosis not present

## 2017-09-11 DIAGNOSIS — I700 Atherosclerosis of aorta: Secondary | ICD-10-CM

## 2017-09-11 DIAGNOSIS — N401 Enlarged prostate with lower urinary tract symptoms: Secondary | ICD-10-CM

## 2017-09-11 DIAGNOSIS — K4090 Unilateral inguinal hernia, without obstruction or gangrene, not specified as recurrent: Secondary | ICD-10-CM | POA: Diagnosis not present

## 2017-09-11 DIAGNOSIS — N200 Calculus of kidney: Secondary | ICD-10-CM | POA: Diagnosis not present

## 2017-09-11 DIAGNOSIS — E559 Vitamin D deficiency, unspecified: Secondary | ICD-10-CM | POA: Insufficient documentation

## 2017-09-11 DIAGNOSIS — R351 Nocturia: Secondary | ICD-10-CM

## 2017-09-11 MED ORDER — VITAMIN D 50 MCG (2000 UT) PO CAPS: 1.0000 | ORAL_CAPSULE | Freq: Every day | ORAL | 0 refills | Status: DC

## 2017-09-11 NOTE — Progress Notes (Signed)
Name: Ronald Fleming   MRN: [id number]    DOB: October 19, 1935   Date:09/11/2017       Progress Note  Subjective  Chief Complaint  Chief Complaint  Patient presents with  . Hypertension  . Hyperlipidemia    HPI  HTN: DBP is a little high, no chest pain or palpitation,chronic right lower leg edema ( history of DVT right leg) - but stable.  He is now on Losartan and bp is at goal. No side effects of medication   Hyperlipidemia: taking statins, denies side effects, last LDL at goal. He also has atherosclerosis of aorta, is on statin therapy and aspirin   Alzheimer's late onset: wife noticed he had memory loss, seen by neurologist and is now on Razadyne12mg  twice daily(SeesDr. Anibal Henderson increased dose from 4mg  to 12mg  on 09/27/2016), he has been on medicationsince Fall 2017,doing well on medications.Weiht is stable now.  he had lost 15 lbs in the previous year. Wife states he does not forget to eat.   Afib: doing well, no SOB or palpitation, on Pradaxa and denies easy bleedingor bruising. Seen by Dr. Clayborn Bigness January 2019 and has follow up in July. Normal exercise tolerance   BPH: he has urinary frequency during the day,  he also has nocturia twice per night , he is on Flomax, states able to fall back asleep, sees dr. Bernardo Heater. Recently had hematuria and found to have kidney stones.   Inguinal hernia : going to see surgeon tomorrow for possible repair, he has good exercise tolerance, he takes Pradaxa and aspirin he has afib and also history of DVT, he never had surgeries before. He has dentures upper and lower. Denies family history of anesthesia complications. Inguinal hernia has been bothersome to him. He will see Dr. Clayborn Bigness in a couple of weeks and advised him to discuss with him how long he can be without pradaxa.   Patient Active Problem List   Diagnosis Date Noted  . DDD (degenerative disc disease), lumbosacral 09/11/2017  . Lesion of pancreas 07/09/2017  . Atherosclerosis of  aorta ([place]) 07/01/2017  . Osteopenia 05/21/2017  . Atrioventricular block, second degree 05/29/2016  . Vitamin B12 deficiency 05/21/2016  . Late onset Alzheimer's disease without behavioral disturbance 05/21/2016  . Vitiligo 02/12/2016  . History of DVT of lower extremity 08/03/2015  . BPH (benign prostatic hyperplasia) 08/03/2015  . Atrial fibrillation ([place]) 02/01/2015  . Edema leg 08/23/2014  . Neuropathy 08/23/2014  . Benign essential HTN 08/27/2006  . Hypercholesterolemia without hypertriglyceridemia 08/27/2006    Past Surgical History:  Procedure Laterality Date  . NO PAST SURGERIES      Family History  Problem Relation Age of Onset  . Coronary artery disease Father   . Stroke Son   . Kidney disease Neg Hx   . Prostate cancer Neg Hx   . Bladder Cancer Neg Hx     Social History   Socioeconomic History  . Marital status: Married    Spouse name: Arlina Robes  . Number of children: 3  . Years of education: Not on file  . 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 Financial resource strain: Not very hard  . Food insecurity:    Worry: Never true    Inability: Never true  . Transportation needs:    Medical: No    Non-medical: No  Tobacco Use  . Smoking status: Never Smoker  .  Smokeless tobacco: Never Used  Substance and Sexual Activity  . Alcohol use: Yes    Alcohol/week: 0.0 oz    Comment: occasionally  . Drug use: No  . Sexual activity: Never    Partners: Female  Lifestyle  . Physical activity:    Days per week: 3 days    Minutes per session: 60 min  . Stress: Not at all  Relationships  . Social connections:    Talks on phone: More than three times a week    Gets together: Three times a week    Attends religious service: Never    Active member of club or organization: Yes    Attends meetings of clubs or organizations: More than 4 times per year     Relationship status: Married  . Intimate partner violence:    Fear of current or ex partner: No    Emotionally abused: No    Physically abused: No    Forced sexual activity: No  Other Topics Concern  . Not on file  Social History Narrative   Married for 50 plus years.    They have two children that live in [place] and one son that lives in [place].     Current Outpatient Medications:  .  aspirin 81 MG tablet, Take 81 mg by mouth daily., Disp: , Rfl:  .  atorvastatin (LIPITOR) 20 MG tablet, TAKE 1 TABLET BY MOUTH ONCE DAILY FOR CHOLESTEROL (IN PLACE OF LOVASTATIN), Disp: 90 tablet, Rfl: 1 .  dabigatran (PRADAXA) 150 MG CAPS capsule, Take 1 capsule (150 mg total) by mouth 2 (two) times daily., Disp: 60 capsule, Rfl: 5 .  galantamine (RAZADYNE) 12 MG tablet, Take 1 tablet by mouth daily., Disp: , Rfl:  .  losartan (COZAAR) 50 MG tablet, Take 1 tablet (50 mg total) by mouth daily., Disp: 90 tablet, Rfl: 0 .  vitamin B-12 (CYANOCOBALAMIN) 1000 MCG tablet, Take by mouth., Disp: , Rfl:   No Known Allergies   ROS  Constitutional: Negative for fever or weight change.  Respiratory: Negative for cough and shortness of breath.   Cardiovascular: Negative for chest pain or palpitations.  Gastrointestinal: Negative for abdominal pain, no bowel changes.  Musculoskeletal: Negative for gait problem or joint swelling.  Skin: Negative for rash.  Neurological: Negative for dizziness or headache.  No other specific complaints in a complete review of systems (except as listed in HPI above).  Objective  Vitals:   09/11/17 0948  BP: 130/80  Pulse: 61  Resp: 16  SpO2: 97%  Weight: 192 lb 6.4 oz (87.3 kg)  Height: 5\' 11"  (1.803 m)    Body mass index is 26.83 kg/m.  Physical Exam  Constitutional: Patient appears well-developed and well-nourished.  No distress.  HEENT: head atraumatic, normocephalic, pupils equal and reactive to light,  neck supple, throat within normal limits Cardiovascular:  Normal rate, regular rhythm and normal heart sounds.  No murmur heard. No BLE edema. Pulmonary/Chest: Effort normal and breath sounds normal. No respiratory distress. Abdominal: Soft.  There is no tenderness. Psychiatric: Patient has a normal mood and affect. behavior is normal. Judgment and thought content normal.  Recent Results (from the past 2160 hour(s))  Urinalysis, Complete     Status: None   Collection Time: 06/25/17 10:49 AM  Result Value Ref Range   Specific Gravity, UA 1.020 1.005 - 1.030   pH, UA 5.0 5.0 - 7.5   Color, UA Yellow Yellow   Appearance Ur Clear Clear   Leukocytes, UA Negative  Negative   Protein, UA Negative Negative/Trace   Glucose, UA Negative Negative   Ketones, UA Negative Negative   RBC, UA Trace Negative   Bilirubin, UA Negative Negative   Urobilinogen, Ur 0.2 0.2 - 1.0 mg/dL   Nitrite, UA Negative Negative   Microscopic Examination See below:   Microscopic Examination     Status: Abnormal   Collection Time: 06/25/17 10:49 AM  Result Value Ref Range   WBC, UA 0-5 0 - 5 /hpf   RBC, UA 0-2 0 - 2 /hpf   Epithelial Cells (non renal) 0-10 0 - 10 /hpf   Bacteria, UA Few (A) None seen/Few      PHQ2/9: Depression screen [facility] 2/9 09/11/2017 06/17/2017 02/19/2017 12/09/2016 05/29/2016  Decreased Interest 0 0 0 0 0  Down, Depressed, Hopeless 0 0 0 0 0  PHQ - 2 Score 0 0 0 0 0     Fall Risk: Fall Risk  09/11/2017 07/17/2017 06/17/2017 05/21/2017 02/19/2017  Falls in the past year? No No No No No     Assessment & Plan  1. Atherosclerosis of aorta (HCC)  On statin and aspirin   2. DDD (degenerative disc disease), lumbosacral  On CT, no pain   3. Lesion of pancreas  Incidental finding, recheck in one year, denies abdominal pain   4. Vitamin B12 deficiency  Taking a few times a week now, last level was at goal   5. Chronic atrial fibrillation (HCC)  On Pradaxa and aspirin , keep follow up with Dr Clayborn Bigness   6. Benign prostatic hyperplasia with  nocturia  Sees Dr. Eliberto Ivory   7. Chronic kidney disease, stage 2 (mild)  Improved   8. Hypercholesterolemia without hypertriglyceridemia  Doing well on statin   9. Vitamin D deficiency  - Cholecalciferol (VITAMIN D) 2000 units CAPS; Take 1 capsule (2,000 Units total) by mouth daily.  Dispense: 30 capsule; Refill: 0  10. Inguinal hernia of right side without obstruction or gangrene  Keep follow up with surgeon

## 2017-09-12 ENCOUNTER — Ambulatory Visit: Payer: Self-pay | Admitting: General Surgery

## 2017-09-12 DIAGNOSIS — K4090 Unilateral inguinal hernia, without obstruction or gangrene, not specified as recurrent: Secondary | ICD-10-CM | POA: Diagnosis not present

## 2017-09-12 NOTE — H&P (Signed)
  History of Present Illness Ralene Ok MD; 09/12/2017 1:29 PM) The patient is a 82 year old male who presents with an inguinal hernia. Referred by: Dr. Ancil Boozer Chief Complaint:right inguinal hernia  Patient is a 82 year old male, with a history of A. fib on pradaxa, early onset Alzheimer's,w/ a several year history of right inguinal hernia. Patient states this is become more cumbersome. He states he has some discomfort however no overt pain when he is on his feet for longer period of time. He states it does flatten out when he lays back. Patient states she's had no signs or symptoms of incarceration or strangulation.  patient had no previous abdominal surgeries.  Patient's cardiologist is Dr. Clayborn Bigness. He states that he has an appointment with next month.    Allergies Lindwood Coke, RN; 09/12/2017 1:18 PM) No Known Drug Allergies [09/12/2017]: Allergies Reconciled   Medication History (Diane Herrin, RN; 09/12/2017 1:20 PM) Atorvastatin Calcium (20MG  Tablet, Oral) Active. Galantamine Hydrobromide (12MG  Tablet, Oral) Active. Losartan Potassium (50MG  Tablet, Oral) Active. Meclizine HCl (25MG  Tablet, Oral) Active. Nitrofurantoin Monohyd Macro (100MG  Capsule, Oral) Active. Pradaxa (150MG  Capsule, Oral) Active. Tamsulosin HCl (0.4MG  Capsule, Oral) Active. Vitamin D (Ergocalciferol) (50000UNIT Capsule, Oral) Active. Vitamin B12 (100MCG Tablet, Oral) Active. Medications Reconciled    Review of Systems Ralene Ok, MD; 09/12/2017 1:30 PM) All other systems negative  Vitals (Diane Herrin RN; 09/12/2017 1:20 PM) 09/12/2017 1:20 PM Weight: 194.13 lb Height: 71in Body Surface Area: 2.08 m Body Mass Index: 27.07 kg/m  Temp.: 98.33F(Oral)  Pulse: 54 (Regular)  P.OX: 97% (Room air) BP: 132/80 (Sitting, Left Arm, Standard)       Physical Exam Ralene Ok MD; 09/12/2017 1:29 PM) The physical exam findings are as follows: Note:Constitutional: No  acute distress, conversant, appears stated age  Eyes: Anicteric sclerae, moist conjunctiva, no lid lag  Neck: No thyromegaly, trachea midline, no cervical lymphadenopathy  Lungs: Clear to auscultation biilaterally, normal respiratory effot  Cardiovascular: regular rate & rhythm, no murmurs, no peripheal edema, pedal pulses 2+  GI: Soft, no masses or hepatosplenomegaly, non-tender to palpation  MSK: Normal gait, no clubbing cyanosis, edema  Skin: No rashes, palpation reveals normal skin turgor  Psychiatric: Appropriate judgment and insight, oriented to person, place, and time  Abdomen Inspection Hernias - Inguinal hernia - Right - Reducible.    Assessment & Plan Ralene Ok MD; 09/12/2017 1:30 PM) RIGHT INGUINAL HERNIA (K40.90) Impression: 82 year old male with a history of A. fib, early onset Alzheimer's, with a right inguinal hernia. Patient will require clearance to be off of his antiplatelet medication by Dr. Clayborn Bigness prior to scheduling surgery. 1. The patient will like to proceed to the operating room for laparoscopic right inguinal hernia repair with mesh.  2. I discussed with the patient the signs and symptoms of incarceration and strangulation and the need to proceed to the ER should they occur.  3. I discussed with the patient the risks and benefits of the procedure to include but not limited to: Infection, bleeding, damage to surrounding structures, possible need for further surgery, possible nerve pain, and possible recurrence. The patient was understanding and wishes to proceed.

## 2017-09-29 DIAGNOSIS — F0280 Dementia in other diseases classified elsewhere without behavioral disturbance: Secondary | ICD-10-CM | POA: Diagnosis not present

## 2017-09-29 DIAGNOSIS — E538 Deficiency of other specified B group vitamins: Secondary | ICD-10-CM | POA: Diagnosis not present

## 2017-09-29 DIAGNOSIS — I4891 Unspecified atrial fibrillation: Secondary | ICD-10-CM | POA: Diagnosis not present

## 2017-09-29 DIAGNOSIS — G301 Alzheimer's disease with late onset: Secondary | ICD-10-CM | POA: Diagnosis not present

## 2017-10-02 DIAGNOSIS — I208 Other forms of angina pectoris: Secondary | ICD-10-CM | POA: Diagnosis not present

## 2017-10-02 DIAGNOSIS — I10 Essential (primary) hypertension: Secondary | ICD-10-CM | POA: Diagnosis not present

## 2017-10-02 DIAGNOSIS — E785 Hyperlipidemia, unspecified: Secondary | ICD-10-CM | POA: Diagnosis not present

## 2017-10-02 DIAGNOSIS — R002 Palpitations: Secondary | ICD-10-CM | POA: Diagnosis not present

## 2017-10-02 DIAGNOSIS — R0602 Shortness of breath: Secondary | ICD-10-CM | POA: Diagnosis not present

## 2017-10-02 DIAGNOSIS — I480 Paroxysmal atrial fibrillation: Secondary | ICD-10-CM | POA: Diagnosis not present

## 2017-10-02 DIAGNOSIS — R011 Cardiac murmur, unspecified: Secondary | ICD-10-CM | POA: Diagnosis not present

## 2017-10-02 DIAGNOSIS — R001 Bradycardia, unspecified: Secondary | ICD-10-CM | POA: Diagnosis not present

## 2017-10-02 DIAGNOSIS — I495 Sick sinus syndrome: Secondary | ICD-10-CM | POA: Diagnosis not present

## 2017-10-13 ENCOUNTER — Other Ambulatory Visit: Payer: Self-pay | Admitting: Family Medicine

## 2017-10-13 DIAGNOSIS — I10 Essential (primary) hypertension: Secondary | ICD-10-CM

## 2017-10-28 ENCOUNTER — Other Ambulatory Visit: Payer: Self-pay

## 2017-10-28 ENCOUNTER — Encounter (HOSPITAL_COMMUNITY)
Admission: RE | Admit: 2017-10-28 | Discharge: 2017-10-28 | Disposition: A | Discharge status: Home / Self Care | Payer: Medicare PPO | Source: Ambulatory Visit | Attending: General Surgery | Admitting: General Surgery

## 2017-10-28 ENCOUNTER — Encounter (HOSPITAL_COMMUNITY): Payer: Self-pay

## 2017-10-28 DIAGNOSIS — I480 Paroxysmal atrial fibrillation: Secondary | ICD-10-CM | POA: Insufficient documentation

## 2017-10-28 DIAGNOSIS — E785 Hyperlipidemia, unspecified: Secondary | ICD-10-CM | POA: Insufficient documentation

## 2017-10-28 DIAGNOSIS — Z79899 Other long term (current) drug therapy: Secondary | ICD-10-CM | POA: Diagnosis not present

## 2017-10-28 DIAGNOSIS — F0390 Unspecified dementia without behavioral disturbance: Secondary | ICD-10-CM | POA: Insufficient documentation

## 2017-10-28 DIAGNOSIS — Z01812 Encounter for preprocedural laboratory examination: Secondary | ICD-10-CM | POA: Diagnosis not present

## 2017-10-28 DIAGNOSIS — Z7901 Long term (current) use of anticoagulants: Secondary | ICD-10-CM | POA: Diagnosis not present

## 2017-10-28 DIAGNOSIS — I10 Essential (primary) hypertension: Secondary | ICD-10-CM | POA: Insufficient documentation

## 2017-10-28 DIAGNOSIS — K4090 Unilateral inguinal hernia, without obstruction or gangrene, not specified as recurrent: Secondary | ICD-10-CM | POA: Diagnosis not present

## 2017-10-28 HISTORY — DX: Cardiac arrhythmia, unspecified: I49.9

## 2017-10-28 HISTORY — DX: Unspecified dementia, unspecified severity, without behavioral disturbance, psychotic disturbance, mood disturbance, and anxiety: F03.90

## 2017-10-28 HISTORY — DX: Benign prostatic hyperplasia without lower urinary tract symptoms: N40.0

## 2017-10-28 HISTORY — DX: Acute embolism and thrombosis of unspecified deep veins of unspecified lower extremity: I82.409

## 2017-10-28 LAB — CBC
HCT: 40.2 % (ref 39.0–52.0)
Hemoglobin: 14.6 g/dL (ref 13.0–17.0)
MCH: 34.9 pg — ABNORMAL HIGH (ref 26.0–34.0)
MCHC: 36.3 g/dL — ABNORMAL HIGH (ref 30.0–36.0)
MCV: 96.2 fL (ref 78.0–100.0)
PLATELETS: 651 10*3/uL — AB (ref 150–400)
RBC: 4.18 MIL/uL — AB (ref 4.22–5.81)
RDW: 12.2 % (ref 11.5–15.5)
WBC: 7 10*3/uL (ref 4.0–10.5)

## 2017-10-28 LAB — BASIC METABOLIC PANEL
ANION GAP: 9 (ref 5–15)
BUN: 15 mg/dL (ref 8–23)
CALCIUM: 8.9 mg/dL (ref 8.9–10.3)
CO2: 24 mmol/L (ref 22–32)
Chloride: 108 mmol/L (ref 98–111)
Creatinine, Ser: 1.3 mg/dL — ABNORMAL HIGH (ref 0.61–1.24)
GFR, EST AFRICAN AMERICAN: 59 mL/min — AB (ref >60)
GFR, EST NON AFRICAN AMERICAN: 51 mL/min — AB (ref >60)
GLUCOSE: 86 mg/dL (ref 70–99)
POTASSIUM: 5.4 mmol/L — AB (ref 3.5–5.1)
Sodium: 141 mmol/L (ref 135–145)

## 2017-10-28 NOTE — Pre-Procedure Instructions (Signed)
Ronald Fleming  10/28/2017      [facility] [address]), [place] - [place] ([place]) [place] [address] Phone: [phone] Fax: [facility] [place], [place] [address] Phone: [phone] Fax: [phone]    Your procedure is scheduled on 11-04-2017  Tuesday .  Report to [facility] Admitting at 7:15 A.M.   Call this number if you have problems the morning of surgery:  [phone]   Remember:  Do not eat  food or drink liquids after midnight.    STOP TAKING ANY ASPIRIN (UNLESS OTHERWISE INSTRUCTED BY YOUR SURGEON),ANTIINFLAMATORIES (IBUPROFEN,ALEVE,MOTRIN,ADVIL,GOODY'S POWDERS),HERBAL SUPPLEMENTS,FISH OIL,AND VITAMINS 5-7 DAYS PRIOR TO SURGERY   .      Take these medicines the morning of surgery with A SIP OF WATER   Atorvastatin(Lipitor) Galantamine(Razadyne) Tamsulosin(Flomax)  Follow your doctor's instructions on when to stop Pradaxa,if no instructions were given contact the prescribing doctor for instructions.     Do not wear jewelry,   Do not wear lotions, powders, or perfumes, or deodorant.  Do not shave 48 hours prior to surgery.  Men may shave face and neck.  Do not bring valuables to the hospital.  [facility] is not responsible for any belongings or valuables.  Contacts, dentures or bridgework may not be worn into surgery.  Leave your suitcase in the car.  After surgery it may be brought to your room.  For patients admitted to the hospital, discharge time will be determined by your treatment team.  Patients discharged the day of surgery will not be allowed to drive home.     [facility] - Preparing for Surgery  Before surgery, you can play an important role.  Because skin is not sterile, your skin needs to be as free of germs as possible.  You can reduce the number of germs on you skin by washing with  CHG (chlorahexidine gluconate) soap before surgery.  CHG is an antiseptic cleaner which kills germs and bonds with the skin to continue killing germs even after washing.  Oral Hygiene is also important in reducing the risk of infection.  Remember to brush your teeth with your regular toothpaste the morning of surgery.  Please DO NOT use if you have an allergy to CHG or antibacterial soaps.  If your skin becomes reddened/irritated stop using the CHG and inform your nurse when you arrive at Short Stay.  Do not shave (including legs and underarms) for at least 48 hours prior to the first CHG shower.  You may shave your face.  Please follow these instructions carefully:   1.  Shower with CHG Soap the night before surgery and the morning of Surgery.  2.  If you choose to wash your hair, wash your hair first as usual with your normal shampoo.  3.  After you shampoo, rinse your hair and body thoroughly to remove the shampoo. 4.  Use CHG as you would any other liquid soap.  You can apply chg directly to the skin and wash gently with a      scrungie or washcloth.           5.  Apply the CHG Soap to your body ONLY FROM THE NECK DOWN.   Do not use on open wounds or open sores. Avoid contact with your eyes, ears, mouth and genitals (private parts).  Wash genitals (private parts) with  your normal soap.  6.  Wash thoroughly, paying special attention to the area where your surgery will be performed.  7.  Thoroughly rinse your body with warm water from the neck down.  8.  DO NOT shower/wash with your normal soap after using and rinsing off the CHG Soap.  9.  Pat yourself dry with a clean towel.            10.  Wear clean pajamas.            11.  Place clean sheets on your bed the night of your first shower and do not sleep with pets.  Day of Surgery  Do not apply any lotions/deoderants the morning of surgery.   Please wear clean clothes to the hospital/surgery center. Remember to brush your teeth with  toothpaste.    Please read over the following fact sheets that you were given. Pain Booklet, Coughing and Deep Breathing and Surgical Site Infection Prevention

## 2017-10-28 NOTE — Progress Notes (Addendum)
Cardiologist Dr. Lujean Amel     Last  OV 10-02-2017 cardiac clearance in care everywhere.  Has already stopped ASA and will stop pradaxa 3 days prior to surgery, last dose on 10-31-2017   Stress test  10-30-2016  ECHO 01-18-2014

## 2017-10-29 ENCOUNTER — Telehealth: Payer: Self-pay

## 2017-10-29 ENCOUNTER — Encounter: Payer: Self-pay | Admitting: Family Medicine

## 2017-10-29 ENCOUNTER — Other Ambulatory Visit: Payer: Self-pay | Admitting: Family Medicine

## 2017-10-29 DIAGNOSIS — D75839 Thrombocytosis, unspecified: Secondary | ICD-10-CM | POA: Insufficient documentation

## 2017-10-29 DIAGNOSIS — E875 Hyperkalemia: Secondary | ICD-10-CM

## 2017-10-29 DIAGNOSIS — D473 Essential (hemorrhagic) thrombocythemia: Secondary | ICD-10-CM

## 2017-10-29 NOTE — Telephone Encounter (Signed)
Spoke with patient regarding his labs and informed him per Dr. Ancil Boozer she would like him to come back by Friday to recheck his levels.

## 2017-10-29 NOTE — Progress Notes (Addendum)
Anesthesia Chart Review:  Case:  [id number] Date/Time:  11/04/17 0900   Procedures:      LAPAROSCOPIC RIGHT  INGUINAL HERNIA REPAIR (Right )     INSERTION OF MESH (Right )   Anesthesia type:  General   Pre-op diagnosis:  right inguinal hernia   Location:  [facility] OR ROOM 02 / [facility] OR   Surgeon:  Ralene Ok, MD      DISCUSSION: 82 yo male never smoker for above procedure. Pertinent hx includes Paroxysmal Afib on Pradaxa, BPPV, Arthritis, Hx of DVT RLE, HTN, Dementia.  He was seen by his Cardiologist at [facility] Dr. Lujean Amel 10/02/2017 for preop clearance. Per his note in Care Everywhere:  1 preoperative clearance patient appears to be an acceptable surgical risks for hernia surgery 2 atrial fibrillation paroxysmal continue current therapy with Pradaxa for anticoagulation does not need rate control 3 hypertension reasonably controlled continue losartan 4 hyperlipidemia patient reasonably stable consider statin therapy with Lovastatin which he is maintained 5 shortness of breath mostly with exertion continue current therapy 6 recommend hold Pradaxa 3 days prior to procedure 7 have the patient follow-up in 6 months  Anticipate he can proceed with surgery as scheduled barring acute status change.   VS: BP (!) 150/79   Pulse (!) 55   Temp 36.5 C   Resp 20   Ht 5\' 11"  (1.803 m)   Wt 88 kg   SpO2 95%   BMI 27.04 kg/m   PROVIDERS: Steele Sizer, MD is PCP last seen 09/11/2017, discussed upcoming surgery  Lujean Amel, MD is Cardiologist last seen 10/02/17 for preop clearance  LABS: Elevated platelets, previously WNL. Discussed with Dr. Suzette Battiest, no DOS recheck recommended. Results routed to PCP for review. (all labs ordered are listed, but only abnormal results are displayed)  Labs Reviewed  CBC - Abnormal; Notable for the following components:      Result Value   RBC 4.18 (*)    MCH 34.9 (*)    MCHC 36.3 (*)    Platelets 651 (*)    All other components within  normal limits  BASIC METABOLIC PANEL - Abnormal; Notable for the following components:   Potassium 5.4 (*)    Creatinine, Ser 1.30 (*)    GFR calc non Af Amer 51 (*)    GFR calc Af Amer 59 (*)    All other components within normal limits     IMAGES: CHEST  2 VIEW 09/01/2015  COMPARISON:  None.  FINDINGS: Cardiac enlargement. Negative for heart failure. Lungs are clear without infiltrate effusion or mass lesion. No acute skeletal abnormality.  IMPRESSION: No active cardiopulmonary disease.   EKG: 10/28/2017: Sinus rhythm with frequent and consecutive Premature ventricular complexes Left axis deviation. Incomplete right bundle branch block. Minimal voltage criteria for LVH, may be normal variant. Septal infarct , age undetermined   CV: Myocardial Perfusion Scan 10/31/2016 (care everywhere): LVEF= 48 %  FINDINGS: Regional wall motion:reveals normal myocardial thickening and wall  motion. The overall quality of the study is good. Artifacts noted: no Left ventricular cavity: normal.  Result Impression   Borderline myocardial perfusion scan no evidence of  stress-induced myocardial ischemia borderline left ventricular function  and ejection fraction of 45-50 % recommend medical therapy for now    Echo 10/30/2016 (care everywhere):  INTERPRETATION REGIONALLY-IMPAIRED LEFT VENTRICLE WITH PRESERVED SYSTOLIC FUNCTION; ESTIAMTED EF = 45-50 % NORMAL RIGHT VENTRICULAR SYSTOLIC FUNCTION MILD TRICUSPID, MITRAL AND AORTIC VALVE INSUFFICIENCY NO VALVULAR STENOSIS MILD LV ENLARGEMENT MILD LA  ENLARGEMENT     Past Medical History:  Diagnosis Date  . Arrhythmia   . Arthritis   . Benign positional vertigo   . BPH (benign prostatic hyperplasia)   . Deep vein blood clot of right lower extremity ([place])   . Dementia   . DVT (deep venous thrombosis) ([place])   . Dysrhythmia    ApFib  . Glaucoma   . Hyperlipidemia   . Hypertension   . Neuropathy     Past Surgical  History:  Procedure Laterality Date  . NO PAST SURGERIES      MEDICATIONS: . atorvastatin (LIPITOR) 20 MG tablet  . Cholecalciferol (VITAMIN D) 2000 units CAPS  . dabigatran (PRADAXA) 150 MG CAPS capsule  . galantamine (RAZADYNE) 12 MG tablet  . losartan (COZAAR) 50 MG tablet  . tamsulosin (FLOMAX) 0.4 MG CAPS capsule  . vitamin B-12 (CYANOCOBALAMIN) 1000 MCG tablet   No current facility-administered medications for this encounter.     Wynonia Musty [facility] Short Stay Center/Anesthesiology Phone [phone] 10/29/2017 1:55 PM

## 2017-10-31 ENCOUNTER — Ambulatory Visit (INDEPENDENT_AMBULATORY_CARE_PROVIDER_SITE_OTHER): Payer: Self-pay

## 2017-10-31 VITALS — BP 136/82 | HR 34

## 2017-10-31 DIAGNOSIS — I482 Chronic atrial fibrillation, unspecified: Secondary | ICD-10-CM

## 2017-10-31 DIAGNOSIS — I10 Essential (primary) hypertension: Secondary | ICD-10-CM

## 2017-10-31 NOTE — Progress Notes (Signed)
Patient is here for a blood pressure check. Patient denies chest pain, palpitations, shortness of breath or visual disturbances. At previous visit blood pressure was 130/80  with a heart rate of 61. Today during nurse visit first check blood pressure was 136/82 his heart rate was 34-37.  He does take any blood pressure medications as prescribed. He was checked by PCP and she said he was in A-fib and that is why his heart rate was up and down.

## 2017-11-03 NOTE — Anesthesia Preprocedure Evaluation (Addendum)
Anesthesia Evaluation  Patient identified by MRN, date of birth, ID band Patient awake    Reviewed: Allergy & Precautions, NPO status , Patient's Chart, lab work & pertinent test results  Airway Mallampati: I  TM Distance: >3 FB Neck ROM: Full    Dental  (+) Edentulous Upper, Edentulous Lower   Pulmonary neg pulmonary ROS,    breath sounds clear to auscultation       Cardiovascular hypertension, Pt. on medications + Peripheral Vascular Disease and + DVT  + dysrhythmias (on pradaxa) Atrial Fibrillation  Rhythm:Regular Rate:Abnormal  EKG: 10/28/2017: Sinus rhythm with frequent and consecutive Premature ventricular complexes.Left axis deviation. Incomplete right bundle branch block.   Myocardial Perfusion Scan 10/31/2016: LVEF= 48 %. No evidence of stress-induced myocardial ischemia    Echo 10/30/2016: EF 45-50%, mild AI, MR, TR     Neuro/Psych Dementia Alzheimer's    GI/Hepatic negative GI ROS, Neg liver ROS,   Endo/Other  negative endocrine ROS  Renal/GU negative Renal ROS     Musculoskeletal  (+) Arthritis , Osteoarthritis,    Abdominal Normal abdominal exam  (+)   Peds  Hematology negative hematology ROS (+)   Anesthesia Other Findings Day of surgery medications reviewed with the patient.  Reproductive/Obstetrics                          Lab Results  Component Value Date   WBC 7.0 10/28/2017   HGB 14.6 10/28/2017   HCT 40.2 10/28/2017   MCV 96.2 10/28/2017   PLT 651 (H) 10/28/2017   Lab Results  Component Value Date   CREATININE 1.30 (H) 10/28/2017   BUN 15 10/28/2017   NA 141 10/28/2017   K 5.4 (H) 10/28/2017   CL 108 10/28/2017   CO2 24 10/28/2017   Lab Results  Component Value Date   INR 1.03 11/10/2017   EKG: normal sinus rhythm.  Anesthesia Physical Anesthesia Plan  ASA: III  Anesthesia Plan: General   Post-op Pain Management:    Induction:  Intravenous  PONV Risk Score and Plan: 2 and Treatment may vary due to age or medical condition, Dexamethasone and Ondansetron  Airway Management Planned: Oral ETT  Additional Equipment: None  Intra-op Plan:   Post-operative Plan: Extubation in OR  Informed Consent: I have reviewed the patients History and Physical, chart, labs and discussed the procedure including the risks, benefits and alternatives for the proposed anesthesia with the patient or authorized representative who has indicated his/her understanding and acceptance.     Plan Discussed with: CRNA  Anesthesia Plan Comments:        Anesthesia Quick Evaluation

## 2017-11-06 MED ORDER — FENTANYL CITRATE (PF) 100 MCG/2ML IJ SOLN: 25.0000 ug | INTRAMUSCULAR | Status: DC | PRN

## 2017-11-09 HISTORY — PX: HERNIA REPAIR: SHX51

## 2017-11-10 ENCOUNTER — Ambulatory Visit (HOSPITAL_COMMUNITY): Payer: Medicare PPO | Admitting: Anesthesiology

## 2017-11-10 ENCOUNTER — Encounter (HOSPITAL_COMMUNITY)
Admission: RE | Disposition: A | Discharge status: Home / Self Care | Payer: Self-pay | Source: Ambulatory Visit | Attending: General Surgery

## 2017-11-10 ENCOUNTER — Encounter (HOSPITAL_COMMUNITY): Payer: Self-pay | Admitting: Anesthesiology

## 2017-11-10 ENCOUNTER — Ambulatory Visit (HOSPITAL_COMMUNITY): Payer: Medicare PPO | Admitting: Physician Assistant

## 2017-11-10 ENCOUNTER — Ambulatory Visit (HOSPITAL_COMMUNITY)
Admission: RE | Admit: 2017-11-10 | Discharge: 2017-11-10 | Disposition: A | Discharge status: Home / Self Care | Payer: Medicare PPO | Source: Ambulatory Visit | Attending: General Surgery | Admitting: General Surgery

## 2017-11-10 DIAGNOSIS — M1990 Unspecified osteoarthritis, unspecified site: Secondary | ICD-10-CM | POA: Insufficient documentation

## 2017-11-10 DIAGNOSIS — I10 Essential (primary) hypertension: Secondary | ICD-10-CM | POA: Diagnosis not present

## 2017-11-10 DIAGNOSIS — I4891 Unspecified atrial fibrillation: Secondary | ICD-10-CM | POA: Diagnosis not present

## 2017-11-10 DIAGNOSIS — K4090 Unilateral inguinal hernia, without obstruction or gangrene, not specified as recurrent: Secondary | ICD-10-CM | POA: Insufficient documentation

## 2017-11-10 DIAGNOSIS — I4510 Unspecified right bundle-branch block: Secondary | ICD-10-CM | POA: Diagnosis not present

## 2017-11-10 DIAGNOSIS — I493 Ventricular premature depolarization: Secondary | ICD-10-CM | POA: Diagnosis not present

## 2017-11-10 DIAGNOSIS — G300 Alzheimer's disease with early onset: Secondary | ICD-10-CM | POA: Insufficient documentation

## 2017-11-10 DIAGNOSIS — Z79899 Other long term (current) drug therapy: Secondary | ICD-10-CM | POA: Insufficient documentation

## 2017-11-10 DIAGNOSIS — F0280 Dementia in other diseases classified elsewhere without behavioral disturbance: Secondary | ICD-10-CM | POA: Insufficient documentation

## 2017-11-10 DIAGNOSIS — I739 Peripheral vascular disease, unspecified: Secondary | ICD-10-CM | POA: Diagnosis not present

## 2017-11-10 DIAGNOSIS — Z7902 Long term (current) use of antithrombotics/antiplatelets: Secondary | ICD-10-CM | POA: Diagnosis not present

## 2017-11-10 HISTORY — PX: INGUINAL HERNIA REPAIR: SHX194

## 2017-11-10 HISTORY — PX: INSERTION OF MESH: SHX5868

## 2017-11-10 LAB — PROTIME-INR
INR: 1.03
Prothrombin Time: 13.4 seconds (ref 11.4–15.2)

## 2017-11-10 LAB — APTT: aPTT: 27 seconds (ref 24–36)

## 2017-11-10 SURGERY — REPAIR, HERNIA, INGUINAL, LAPAROSCOPIC
Anesthesia: General | Laterality: Right

## 2017-11-10 MED ORDER — 0.9 % SODIUM CHLORIDE (POUR BTL) OPTIME
TOPICAL | Status: DC | PRN
  Administered 2017-11-10: 1000 mL

## 2017-11-10 MED ORDER — SUGAMMADEX SODIUM 200 MG/2ML IV SOLN
INTRAVENOUS | Status: DC | PRN
  Administered 2017-11-10: 180 mg via INTRAVENOUS

## 2017-11-10 MED ORDER — ROCURONIUM BROMIDE 50 MG/5ML IV SOSY
PREFILLED_SYRINGE | INTRAVENOUS | Status: AC
  Filled 2017-11-10: qty 5

## 2017-11-10 MED ORDER — TRAMADOL HCL 50 MG PO TABS: 50.0000 mg | ORAL_TABLET | Freq: Four times a day (QID) | ORAL | 0 refills | Status: DC | PRN

## 2017-11-10 MED ORDER — DEXAMETHASONE SODIUM PHOSPHATE 10 MG/ML IJ SOLN
INTRAMUSCULAR | Status: AC
  Filled 2017-11-10: qty 3

## 2017-11-10 MED ORDER — ACETAMINOPHEN 500 MG PO TABS
ORAL_TABLET | ORAL | Status: AC
  Administered 2017-11-10: 1000 mg
  Filled 2017-11-10: qty 2

## 2017-11-10 MED ORDER — LACTATED RINGERS IV SOLN
INTRAVENOUS | Status: DC
  Administered 2017-11-10 (×2): via INTRAVENOUS

## 2017-11-10 MED ORDER — CHLORHEXIDINE GLUCONATE CLOTH 2 % EX PADS: 6.0000 | MEDICATED_PAD | Freq: Once | CUTANEOUS | Status: DC

## 2017-11-10 MED ORDER — DEXAMETHASONE SODIUM PHOSPHATE 10 MG/ML IJ SOLN
INTRAMUSCULAR | Status: DC | PRN
  Administered 2017-11-10: 10 mg via INTRAVENOUS

## 2017-11-10 MED ORDER — GABAPENTIN 300 MG PO CAPS: 300.0000 mg | ORAL_CAPSULE | ORAL | Status: DC

## 2017-11-10 MED ORDER — LIDOCAINE 2% (20 MG/ML) 5 ML SYRINGE
INTRAMUSCULAR | Status: DC | PRN
  Administered 2017-11-10: 60 mg via INTRAVENOUS

## 2017-11-10 MED ORDER — MIDAZOLAM HCL 2 MG/2ML IJ SOLN
INTRAMUSCULAR | Status: AC
  Filled 2017-11-10: qty 2

## 2017-11-10 MED ORDER — PROPOFOL 10 MG/ML IV BOLUS
INTRAVENOUS | Status: AC
  Filled 2017-11-10: qty 20

## 2017-11-10 MED ORDER — FENTANYL CITRATE (PF) 250 MCG/5ML IJ SOLN
INTRAMUSCULAR | Status: AC
  Filled 2017-11-10: qty 5

## 2017-11-10 MED ORDER — ROCURONIUM BROMIDE 10 MG/ML (PF) SYRINGE
PREFILLED_SYRINGE | INTRAVENOUS | Status: DC | PRN
  Administered 2017-11-10: 20 mg via INTRAVENOUS
  Administered 2017-11-10: 10 mg via INTRAVENOUS
  Administered 2017-11-10: 50 mg via INTRAVENOUS

## 2017-11-10 MED ORDER — LIDOCAINE 2% (20 MG/ML) 5 ML SYRINGE
INTRAMUSCULAR | Status: AC
  Filled 2017-11-10: qty 5

## 2017-11-10 MED ORDER — BUPIVACAINE HCL (PF) 0.25 % IJ SOLN
INTRAMUSCULAR | Status: AC
  Filled 2017-11-10: qty 30

## 2017-11-10 MED ORDER — GABAPENTIN 300 MG PO CAPS
ORAL_CAPSULE | ORAL | Status: AC
  Administered 2017-11-10: 300 mg
  Filled 2017-11-10: qty 1

## 2017-11-10 MED ORDER — FENTANYL CITRATE (PF) 100 MCG/2ML IJ SOLN
INTRAMUSCULAR | Status: DC | PRN
  Administered 2017-11-10 (×3): 50 ug via INTRAVENOUS

## 2017-11-10 MED ORDER — CEFAZOLIN SODIUM-DEXTROSE 2-4 GM/100ML-% IV SOLN
INTRAVENOUS | Status: AC
  Filled 2017-11-10: qty 100

## 2017-11-10 MED ORDER — PROPOFOL 10 MG/ML IV BOLUS
INTRAVENOUS | Status: DC | PRN
  Administered 2017-11-10: 80 mg via INTRAVENOUS

## 2017-11-10 MED ORDER — ACETAMINOPHEN 500 MG PO TABS: 1000.0000 mg | ORAL_TABLET | ORAL | Status: DC

## 2017-11-10 MED ORDER — CEFAZOLIN SODIUM-DEXTROSE 2-4 GM/100ML-% IV SOLN
2.0000 g | INTRAVENOUS | Status: AC
  Administered 2017-11-10: 2 g via INTRAVENOUS

## 2017-11-10 MED ORDER — BUPIVACAINE HCL 0.25 % IJ SOLN
INTRAMUSCULAR | Status: DC | PRN
  Administered 2017-11-10: 7 mL

## 2017-11-10 MED ORDER — SODIUM CHLORIDE 0.9 % IV SOLN
INTRAVENOUS | Status: DC | PRN
  Administered 2017-11-10: 20 ug/min via INTRAVENOUS

## 2017-11-10 MED ORDER — FENTANYL CITRATE (PF) 100 MCG/2ML IJ SOLN: 25.0000 ug | INTRAMUSCULAR | Status: DC | PRN

## 2017-11-10 MED ORDER — ONDANSETRON HCL 4 MG/2ML IJ SOLN
INTRAMUSCULAR | Status: DC | PRN
  Administered 2017-11-10: 4 mg via INTRAVENOUS

## 2017-11-10 SURGICAL SUPPLY — 45 items
APPLIER CLIP 5 13 M/L LIGAMAX5 (MISCELLANEOUS)
BENZOIN TINCTURE PRP APPL 2/3 (GAUZE/BANDAGES/DRESSINGS) ×3 IMPLANT
CANISTER SUCT 3000ML PPV (MISCELLANEOUS) IMPLANT
CHLORAPREP W/TINT 26ML (MISCELLANEOUS) ×3 IMPLANT
CLIP APPLIE 5 13 M/L LIGAMAX5 (MISCELLANEOUS) IMPLANT
CLOSURE WOUND 1/2 X4 (GAUZE/BANDAGES/DRESSINGS) ×1
COVER SURGICAL LIGHT HANDLE (MISCELLANEOUS) ×3 IMPLANT
DECANTER SPIKE VIAL GLASS SM (MISCELLANEOUS) ×3 IMPLANT
DEFOGGER SCOPE WARMER CLEARIFY (MISCELLANEOUS) IMPLANT
DERMABOND ADVANCED (GAUZE/BANDAGES/DRESSINGS) ×2
DERMABOND ADVANCED .7 DNX12 (GAUZE/BANDAGES/DRESSINGS) ×1 IMPLANT
DISSECTOR BLUNT TIP ENDO 5MM (MISCELLANEOUS) IMPLANT
ELECT REM PT RETURN 9FT ADLT (ELECTROSURGICAL) ×3
ELECTRODE REM PT RTRN 9FT ADLT (ELECTROSURGICAL) ×1 IMPLANT
GAUZE SPONGE 2X2 8PLY STRL LF (GAUZE/BANDAGES/DRESSINGS) ×1 IMPLANT
GLOVE BIO SURGEON STRL SZ7.5 (GLOVE) ×3 IMPLANT
GOWN STRL REUS W/ TWL LRG LVL3 (GOWN DISPOSABLE) ×2 IMPLANT
GOWN STRL REUS W/ TWL XL LVL3 (GOWN DISPOSABLE) ×1 IMPLANT
GOWN STRL REUS W/TWL LRG LVL3 (GOWN DISPOSABLE) ×4
GOWN STRL REUS W/TWL XL LVL3 (GOWN DISPOSABLE) ×2
KIT BASIN OR (CUSTOM PROCEDURE TRAY) ×3 IMPLANT
KIT TURNOVER KIT B (KITS) ×3 IMPLANT
MESH 3DMAX 5X7 RT XLRG (Mesh General) ×3 IMPLANT
NEEDLE INSUFFLATION 14GA 120MM (NEEDLE) IMPLANT
NS IRRIG 1000ML POUR BTL (IV SOLUTION) ×3 IMPLANT
PAD ARMBOARD 7.5X6 YLW CONV (MISCELLANEOUS) ×6 IMPLANT
RELOAD STAPLE HERNIA 4.0 BLUE (INSTRUMENTS) ×3 IMPLANT
RELOAD STAPLE HERNIA 4.8 BLK (STAPLE) IMPLANT
SCISSORS LAP 5X35 DISP (ENDOMECHANICALS) ×3 IMPLANT
SET IRRIG TUBING LAPAROSCOPIC (IRRIGATION / IRRIGATOR) IMPLANT
SET TROCAR LAP APPLE-HUNT 5MM (ENDOMECHANICALS) ×3 IMPLANT
SPONGE GAUZE 2X2 STER 10/PKG (GAUZE/BANDAGES/DRESSINGS) ×2
STAPLER HERNIA 12 8.5 360D (INSTRUMENTS) ×3 IMPLANT
STRIP CLOSURE SKIN 1/2X4 (GAUZE/BANDAGES/DRESSINGS) ×2 IMPLANT
SUT MNCRL AB 4-0 PS2 18 (SUTURE) ×3 IMPLANT
SUT VIC AB 1 CT1 27 (SUTURE)
SUT VIC AB 1 CT1 27XBRD ANBCTR (SUTURE) IMPLANT
SYRINGE TOOMEY DISP (SYRINGE) ×3 IMPLANT
TOWEL OR 17X24 6PK STRL BLUE (TOWEL DISPOSABLE) ×3 IMPLANT
TOWEL OR 17X26 10 PK STRL BLUE (TOWEL DISPOSABLE) IMPLANT
TRAY FOLEY W/BAG SLVR 14FR (SET/KITS/TRAYS/PACK) ×3 IMPLANT
TRAY LAPAROSCOPIC MC (CUSTOM PROCEDURE TRAY) ×3 IMPLANT
TROCAR XCEL 12X100 BLDLESS (ENDOMECHANICALS) ×3 IMPLANT
TUBING INSUFFLATION (TUBING) ×3 IMPLANT
WATER STERILE IRR 1000ML POUR (IV SOLUTION) ×3 IMPLANT

## 2017-11-10 NOTE — Op Note (Signed)
11/10/2017  3:41 PM  PATIENT:  Ronald Fleming  82 y.o. male  PRE-OPERATIVE DIAGNOSIS:  right inguinal hernia  POST-OPERATIVE DIAGNOSIS:  LARGE right indirect inguinal hernia  PROCEDURE:  Procedure(s): LAPAROSCOPIC RIGHT  INGUINAL HERNIA REPAIR (Right) INSERTION OF MESH (Right)  SURGEON:  Surgeon(s) and Role:    Ralene Ok, MD - Primary  ANESTHESIA:   local and general  EBL:  minimal   BLOOD ADMINISTERED:none  DRAINS: none   LOCAL MEDICATIONS USED:  BUPIVICAINE   SPECIMEN:  No Specimen  DISPOSITION OF SPECIMEN:  N/A  COUNTS:  YES  TOURNIQUET:  * No tourniquets in log *  DICTATION: .Dragon Dictation   Counts: reported as correct x 2  Findings:  The patient had a large right indirect hernia  Indications for procedure:  The patient is a 82 year old male with a right inguinal hernia for several months. Patient complained of symptomatology to his right inguinal area. The patient was taken back for elective inguinal hernia repair.  Details of the procedure: The patient was taken back to the operating room. The patient was placed in supine position with bilateral SCDs in place.  The patient was prepped and draped in the usual sterile fashion.  After appropriate anitbiotics were confirmed, a time-out was confirmed and all facts were verified.  0.25% Marcaine was used to infiltrate the umbilical area. A 11-blade was used to cut down the skin and blunt dissection was used to get the anterior fashion.  The anterior fascia was incised approximately 1 cm and the muscles were retracted laterally. Blunt dissection was then used to create a space in the preperitoneal area. At this time a 10 mm camera was then introduced into the space and advanced the pubic tubercle and a 12 mm trocar was placed over this and insufflation was started.  At this time and space was created from medial to laterally the preperitoneal space.  Cooper's ligament was initially cleaned off.  The large  hernia sac was identified in the indirect space. Dissection of the hernia sac was undertaken the vas deferens was identified and protected in all parts of the case.    Once the hernia sac was taken down to approximately the umbilicus a Bard 3D Max mesh, size: Rachelle Hora, was  introduced into the preperitoneal space.  The mesh was brought over to cover the direct and indirect hernia spaces.  This was anchored into place and secured to Cooper's ligament with 4.89mm staples from a [place] hernia stapler. It was anchored to the anterior abdominal wall with 4.8 mm staples. The hernia sac was seen lying posterior to the mesh. There was no staples placed laterally. The insufflation was evacuated and the peritoneum was seen posterior to the mesh. The trochars were removed. The anterior fascia was reapproximated using #1 Vicryl on a UR- 6.  Intra-abdominal air was evacuated and the Veress needle removed. The skin was reapproximated using 4-0 Monocryl subcuticular fashion and the skin dressed with Dermabond.  The patient was awakened from general anesthesia and taken to recovery in stable condition.   PLAN OF CARE: Discharge to home after PACU  PATIENT DISPOSITION:  PACU - hemodynamically stable.   Delay start of Pharmacological VTE agent (>24hrs) due to surgical blood loss or risk of bleeding: not applicable

## 2017-11-10 NOTE — Transfer of Care (Signed)
Immediate Anesthesia Transfer of Care Note  Patient: Ronald Fleming  Procedure(s) Performed: LAPAROSCOPIC RIGHT  INGUINAL HERNIA REPAIR (Right ) INSERTION OF MESH (Right )  Patient Location: PACU  Anesthesia Type:General  Level of Consciousness: awake, alert  and oriented  Airway & Oxygen Therapy: Patient Spontanous Breathing and Patient connected to nasal cannula oxygen  Post-op Assessment: Report given to RN, Post -op Vital signs reviewed and stable and Patient moving all extremities  Post vital signs: Reviewed and stable  Last Vitals:  Vitals Value Taken Time  BP 143/95 11/10/2017  3:54 PM  Temp    Pulse 78 11/10/2017  3:56 PM  Resp 21 11/10/2017  3:56 PM  SpO2 100 % 11/10/2017  3:56 PM  Vitals shown include unvalidated device data.  Last Pain:  Vitals:   11/10/17 1241  TempSrc: Oral         Complications: No apparent anesthesia complications

## 2017-11-10 NOTE — Discharge Instructions (Signed)
[facility] _______[facility] °INGUINAL HERNIA REPAIR: POST OP INSTRUCTIONS ° °Always review your discharge instruction sheet given to you by the facility where your surgery was performed. °IF YOU HAVE DISABILITY OR FAMILY LEAVE FORMS, YOU MUST BRING THEM TO THE OFFICE FOR PROCESSING.   °DO NOT GIVE THEM TO YOUR DOCTOR. ° °1. A  prescription for pain medication may be given to you upon discharge.  Take your pain medication as prescribed, if needed.  If narcotic pain medicine is not needed, then you may take acetaminophen (Tylenol) or ibuprofen (Advil) as needed. °2. Take your usually prescribed medications unless otherwise directed. °If you need a refill on your pain medication, please contact your pharmacy.  They will contact our office to request authorization. Prescriptions will not be filled after 5 pm or on week-ends. °3. You should follow a light diet the first 24 hours after arrival home, such as soup and crackers, etc.  Be sure to include lots of fluids daily.  Resume your normal diet the day after surgery. °4.Most patients will experience some swelling and bruising around the umbilicus or in the groin and scrotum.  Ice packs and reclining will help.  Swelling and bruising can take several days to resolve.  °6. It is common to experience some constipation if taking pain medication after surgery.  Increasing fluid intake and taking a stool softener (such as Colace) will usually help or prevent this problem from occurring.  A mild laxative (Milk of Magnesia or Miralax) should be taken according to package directions if there are no bowel movements after 48 hours. °7. Unless discharge instructions indicate otherwise, you may remove your bandages 24-48 hours after surgery, and you may shower at that time.  You may have steri-strips (small skin tapes) in place directly over the incision.  These strips should be left on the skin for 7-10 days.  If your surgeon used skin glue on the incision, you may  shower in 24 hours.  The glue will flake off over the next 2-3 weeks.  Any sutures or staples will be removed at the office during your follow-up visit. °8. ACTIVITIES:  You may resume regular (light) daily activities beginning the next day--such as daily self-care, walking, climbing stairs--gradually increasing activities as tolerated.  You may have sexual intercourse when it is comfortable.  Refrain from any heavy lifting or straining until approved by your doctor. ° °a.You may drive when you are no longer taking prescription pain medication, you can comfortably wear a seatbelt, and you can safely maneuver your car and apply brakes. °b.RETURN TO WORK:   °_____________________________________________ ° °9.You should see your doctor in the office for a follow-up appointment approximately 2-3 weeks after your surgery.  Make sure that you call for this appointment within a day or two after you arrive home to insure a convenient appointment time. °10.OTHER INSTRUCTIONS: _________________________ °   _____________________________________ ° °WHEN TO CALL YOUR DOCTOR: °1. Fever over 101.0 °2. Inability to urinate °3. Nausea and/or vomiting °4. Extreme swelling or bruising °5. Continued bleeding from incision. °6. Increased pain, redness, or drainage from the incision ° °The clinic staff is available to answer your questions during regular business hours.  Please don’t hesitate to call and ask to speak to one of the nurses for clinical concerns.  If you have a medical emergency, go to the nearest emergency room or call 911.  A surgeon from [facility] is always on call at the hospital ° ° °[address], [address], [place], [place]  [address] ? ° [address] [address], [place], [place]   [address] °[phone] ? [phone] ? FAX [phone] °Web site: [email or web address] ° °

## 2017-11-10 NOTE — Anesthesia Procedure Notes (Signed)
Procedure Name: Intubation Date/Time: 11/10/2017 2:58 PM Performed by: Leonor Liv, CRNA Pre-anesthesia Checklist: Patient identified, Emergency Drugs available, Suction available and Patient being monitored Patient Re-evaluated:Patient Re-evaluated prior to induction Oxygen Delivery Method: Circle System Utilized Preoxygenation: Pre-oxygenation with 100% oxygen Induction Type: IV induction Ventilation: Mask ventilation without difficulty and Oral airway inserted - appropriate to patient size Laryngoscope Size: Mac and 4 Grade View: Grade I Tube type: Oral Tube size: 7.5 mm Number of attempts: 1 Airway Equipment and Method: Stylet and Oral airway Placement Confirmation: ETT inserted through vocal cords under direct vision,  positive ETCO2 and breath sounds checked- equal and bilateral Secured at: 23 cm Tube secured with: Tape Dental Injury: Teeth and Oropharynx as per pre-operative assessment

## 2017-11-10 NOTE — Anesthesia Postprocedure Evaluation (Signed)
Anesthesia Post Note  Patient: Ronald Fleming  Procedure(s) Performed: LAPAROSCOPIC RIGHT  INGUINAL HERNIA REPAIR (Right ) INSERTION OF MESH (Right )     Patient location during evaluation: PACU Anesthesia Type: General Level of consciousness: awake and alert Pain management: pain level controlled Vital Signs Assessment: post-procedure vital signs reviewed and stable Respiratory status: spontaneous breathing, nonlabored ventilation, respiratory function stable and patient connected to nasal cannula oxygen Cardiovascular status: blood pressure returned to baseline and stable Postop Assessment: no apparent nausea or vomiting Anesthetic complications: no    Last Vitals:  Vitals:   11/10/17 1628 11/10/17 1634  BP:  (!) 151/88  Pulse:  67  Resp:  17  Temp:    SpO2: 98% 98%    Last Pain:  Vitals:   11/10/17 1634  TempSrc:   PainSc: 0-No pain                 Kileigh Ortmann COKER

## 2017-11-10 NOTE — H&P (Signed)
  History of Present Illness  The patient is a 82 year old male who presents with an inguinal hernia. Referred by: Dr. Ancil Boozer Chief Complaint:right inguinal hernia  Patient is a 81 year old male, with a history of A. fib on pradaxa, early onset Alzheimer's,w/ a several year history of right inguinal hernia. Patient states this is become more cumbersome. He states he has some discomfort however no overt pain when he is on his feet for longer period of time. He states it does flatten out when he lays back. Patient states she's had no signs or symptoms of incarceration or strangulation.  patient had no previous abdominal surgeries.  Patient's cardiologist is Dr. Clayborn Bigness. He states that he has an appointment with next month.    Allergies No Known Drug Allergies [09/12/2017]: Allergies Reconciled   Medication History Atorvastatin Calcium (20MG  Tablet, Oral) Active. Galantamine Hydrobromide (12MG  Tablet, Oral) Active. Losartan Potassium (50MG  Tablet, Oral) Active. Meclizine HCl (25MG  Tablet, Oral) Active. Nitrofurantoin Monohyd Macro (100MG  Capsule, Oral) Active. Pradaxa (150MG  Capsule, Oral) Active. Tamsulosin HCl (0.4MG  Capsule, Oral) Active. Vitamin D (Ergocalciferol) (50000UNIT Capsule, Oral) Active. Vitamin B12 (100MCG Tablet, Oral) Active. Medications Reconciled    Review of Systems  All other systems negative  BP (!) 164/94   Pulse (!) 58   Temp 97.7 F (36.5 C) (Oral)   Resp 18   SpO2 98%     Physical Exam  The physical exam findings are as follows: Note:Constitutional: No acute distress, conversant, appears stated age  Eyes: Anicteric sclerae, moist conjunctiva, no lid lag  Neck: No thyromegaly, trachea midline, no cervical lymphadenopathy  Lungs: Clear to auscultation biilaterally, normal respiratory effot  Cardiovascular: regular rate & rhythm, no murmurs, no peripheal edema, pedal pulses 2+  GI: Soft, no masses or  hepatosplenomegaly, non-tender to palpation  MSK: Normal gait, no clubbing cyanosis, edema  Skin: No rashes, palpation reveals normal skin turgor  Psychiatric: Appropriate judgment and insight, oriented to person, place, and time  Abdomen Inspection Hernias - Inguinal hernia - Right - Reducible.    Assessment & Plan  RIGHT INGUINAL HERNIA (K40.90) Impression: 82 year old male with a history of A. fib, early onset Alzheimer's, with a right inguinal hernia. Patient will require clearance to be off of his antiplatelet medication by Dr. Clayborn Bigness prior to scheduling surgery. 1. The patient will like to proceed to the operating room for laparoscopic right inguinal hernia repair with mesh.  2. I discussed with the patient the signs and symptoms of incarceration and strangulation and the need to proceed to the ER should they occur.  3. I discussed with the patient the risks and benefits of the procedure to include but not limited to: Infection, bleeding, damage to surrounding structures, possible need for further surgery, possible nerve pain, and possible recurrence. The patient was understanding and wishes to proceed.

## 2017-11-11 ENCOUNTER — Encounter (HOSPITAL_COMMUNITY): Payer: Self-pay | Admitting: General Surgery

## 2017-11-14 ENCOUNTER — Other Ambulatory Visit: Payer: Self-pay | Admitting: Family Medicine

## 2017-11-14 DIAGNOSIS — E785 Hyperlipidemia, unspecified: Secondary | ICD-10-CM

## 2017-12-31 ENCOUNTER — Ambulatory Visit (INDEPENDENT_AMBULATORY_CARE_PROVIDER_SITE_OTHER): Payer: Medicare PPO

## 2017-12-31 DIAGNOSIS — Z23 Encounter for immunization: Secondary | ICD-10-CM | POA: Diagnosis not present

## 2018-01-12 ENCOUNTER — Ambulatory Visit: Payer: 59 | Admitting: Family Medicine

## 2018-01-12 ENCOUNTER — Encounter: Payer: Self-pay | Admitting: Family Medicine

## 2018-01-12 VITALS — BP 122/64 | HR 71 | Temp 97.9°F | Resp 14 | Ht 71.0 in | Wt 188.0 lb

## 2018-01-12 DIAGNOSIS — E7800 Pure hypercholesterolemia, unspecified: Secondary | ICD-10-CM

## 2018-01-12 DIAGNOSIS — E538 Deficiency of other specified B group vitamins: Secondary | ICD-10-CM | POA: Diagnosis not present

## 2018-01-12 DIAGNOSIS — N183 Chronic kidney disease, stage 3 unspecified: Secondary | ICD-10-CM

## 2018-01-12 DIAGNOSIS — R739 Hyperglycemia, unspecified: Secondary | ICD-10-CM | POA: Diagnosis not present

## 2018-01-12 DIAGNOSIS — D473 Essential (hemorrhagic) thrombocythemia: Secondary | ICD-10-CM | POA: Diagnosis not present

## 2018-01-12 DIAGNOSIS — D75839 Thrombocytosis, unspecified: Secondary | ICD-10-CM

## 2018-01-12 DIAGNOSIS — Z86718 Personal history of other venous thrombosis and embolism: Secondary | ICD-10-CM

## 2018-01-12 DIAGNOSIS — I480 Paroxysmal atrial fibrillation: Secondary | ICD-10-CM | POA: Diagnosis not present

## 2018-01-12 DIAGNOSIS — I10 Essential (primary) hypertension: Secondary | ICD-10-CM | POA: Diagnosis not present

## 2018-01-12 DIAGNOSIS — G301 Alzheimer's disease with late onset: Secondary | ICD-10-CM | POA: Diagnosis not present

## 2018-01-12 DIAGNOSIS — I700 Atherosclerosis of aorta: Secondary | ICD-10-CM

## 2018-01-12 DIAGNOSIS — N401 Enlarged prostate with lower urinary tract symptoms: Secondary | ICD-10-CM

## 2018-01-12 DIAGNOSIS — R3915 Urgency of urination: Secondary | ICD-10-CM

## 2018-01-12 DIAGNOSIS — F0280 Dementia in other diseases classified elsewhere without behavioral disturbance: Secondary | ICD-10-CM

## 2018-01-12 DIAGNOSIS — K869 Disease of pancreas, unspecified: Secondary | ICD-10-CM

## 2018-01-12 MED ORDER — DABIGATRAN ETEXILATE MESYLATE 150 MG PO CAPS: 150.0000 mg | ORAL_CAPSULE | Freq: Two times a day (BID) | ORAL | 5 refills | Status: DC

## 2018-01-12 MED ORDER — LOSARTAN POTASSIUM 50 MG PO TABS: 50.0000 mg | ORAL_TABLET | Freq: Every day | ORAL | 1 refills | Status: DC

## 2018-01-12 NOTE — Progress Notes (Signed)
Name: Ronald Fleming   MRN: [id number]    DOB: 1935/12/07   Date:01/12/2018       Progress Note  Subjective  Chief Complaint  Chief Complaint  Patient presents with  . Follow-up  . paperwork  . Medication Refill    HPI  HTN:BP is at goal today,  no chest pain or palpitation,chronic right lower leg edema ( history of DVT right leg) No side effects of medication   Hyperlipidemia:taking statins, denies side effects, last LDL at goal. He also has atherosclerosis of aorta, is on statin therapy , off aspirin, but is on pradaxa   Alzheimer's late onset: wife noticed he had memory loss, seen by neurologist and is now on Razadyne25m twice dailye has been on medicationsince Fall 2017,doing well on medications.He had lost 15 lbs in the previous year, wife states in the past he was forgetting to eat, but today he states appetite is too good and does not want to gain anymore weight.   Afib: doing well, no SOB or palpitation, on Pradaxa and denies easy bleedingor bruising.Seen by Dr. CClayborn BignessJanuary 2019  and was advised to stop aspirin. Rate controlled  BPH: he has urinary frequency during the day,  he also has nocturia twice per night , he is on Flomax,states able to fall back asleep, sees dr. SBernardo Heater Recently had hematuria and found to have kidney stones. Per Dr. SAshok Cordianotes no reason to recheck PSA .   Inguinal hernia : s/p repair, doing well, no problems  He brought paperwork for a walk in shower, but explained he does not have any diagnosis that would justify getting it done  Patient Active Problem List   Diagnosis Date Noted  . Thrombocytosis ([place] 10/29/2017  . DDD (degenerative disc disease), lumbosacral 09/11/2017  . Nephrolithiasis 09/11/2017  . Inguinal hernia of right side without obstruction or gangrene 09/11/2017  . Vitamin D deficiency 09/11/2017  . Lesion of pancreas 07/09/2017  . Atherosclerosis of aorta ([facility] 07/01/2017  . Osteopenia 05/21/2017  .  Atrioventricular block, second degree 05/29/2016  . Vitamin B12 deficiency 05/21/2016  . Late onset Alzheimer's disease without behavioral disturbance ([facility] 05/21/2016  . Vitiligo 02/12/2016  . History of DVT of lower extremity 08/03/2015  . BPH (benign prostatic hyperplasia) 08/03/2015  . Atrial fibrillation ([facility] 02/01/2015  . Edema leg 08/23/2014  . Neuropathy 08/23/2014  . Benign essential HTN 08/27/2006  . Hypercholesterolemia without hypertriglyceridemia 08/27/2006    Past Surgical History:  Procedure Laterality Date  . INGUINAL HERNIA REPAIR Right 11/10/2017   Procedure: LAPAROSCOPIC RIGHT  INGUINAL HERNIA REPAIR;  Surgeon: RRalene Ok MD;  [place] [facility]  Service: General;  Laterality: Right;  . INSERTION OF MESH Right 11/10/2017   Procedure: INSERTION OF MESH;  Surgeon: RRalene Ok MD;  [place] [facility]  Service: General;  Laterality: Right;  . NO PAST SURGERIES      Family History  Problem Relation Age of Onset  . Coronary artery disease Father   . Stroke Son   . Kidney disease Neg Hx   . Prostate cancer Neg Hx   . Bladder Cancer Neg Hx     Social History   Socioeconomic History  . Marital status: Married    Spouse name: CArlina Robes . Number of children: 3  . Years of education: Not on file  . 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 FEmergency planning/management officer  strain: Not very hard  . Food insecurity:    Worry: Never true    Inability: Never true  . Transportation needs:    Medical: No    Non-medical: No  Tobacco Use  . Smoking status: Never Smoker  . Smokeless tobacco: Never Used  Substance and Sexual Activity  . Alcohol use: Not Currently    Alcohol/week: 0.0 standard drinks    Frequency: Never  . Drug use: No  . Sexual activity: Never    Partners: Female  Lifestyle  . Physical activity:    Days per week: 3 days    Minutes per  session: 60 min  . Stress: Not at all  Relationships  . Social connections:    Talks on phone: More than three times a week    Gets together: Three times a week    Attends religious service: Never    Active member of club or organization: Yes    Attends meetings of clubs or organizations: More than 4 times per year    Relationship status: Married  . Intimate partner violence:    Fear of current or ex partner: No    Emotionally abused: No    Physically abused: No    Forced sexual activity: No  Other Topics Concern  . Not on file  Social History Narrative   Married for 50 plus years.    They have two children that live in [place] and one son that lives in [place].     Current Outpatient Medications:  .  atorvastatin (LIPITOR) 20 MG tablet, TAKE 1 TABLET BY MOUTH ONCE DAILY FOR CHOLESTEROL (IN PLACE OF LOVASTATIN), Disp: 90 tablet, Rfl: 1 .  Cholecalciferol (VITAMIN D) 2000 units CAPS, Take 1 capsule (2,000 Units total) by mouth daily., Disp: 30 capsule, Rfl: 0 .  galantamine (RAZADYNE) 12 MG tablet, Take 12 mg by mouth 2 (two) times daily. , Disp: , Rfl:  .  tamsulosin (FLOMAX) 0.4 MG CAPS capsule, Take 0.4 mg by mouth daily., Disp: , Rfl:  .  traMADol (ULTRAM) 50 MG tablet, Take 1 tablet (50 mg total) by mouth every 6 (six) hours as needed., Disp: 20 tablet, Rfl: 0 .  vitamin B-12 (CYANOCOBALAMIN) 1000 MCG tablet, Take 1,000 mcg by mouth 3 (three) times a week. , Disp: , Rfl:  .  dabigatran (PRADAXA) 150 MG CAPS capsule, Take 1 capsule (150 mg total) by mouth 2 (two) times daily., Disp: 60 capsule, Rfl: 5 .  losartan (COZAAR) 50 MG tablet, Take 1 tablet (50 mg total) by mouth daily., Disp: 90 tablet, Rfl: 1  No Known Allergies  I personally reviewed active problem list, medication list, allergies, family history with the patient/caregiver today.   ROS  Constitutional: Negative for fever or weight change.  Respiratory: Negative for cough and shortness of breath.    Cardiovascular: Negative for chest pain or palpitations.  Gastrointestinal: Negative for abdominal pain, no bowel changes.  Musculoskeletal: Negative for gait problem or joint swelling.  Skin: Negative for rash.  Neurological: Negative for dizziness or headache.  No other specific complaints in a complete review of systems (except as listed in HPI above).  Objective  Vitals:   01/12/18 1007  BP: 122/64  Pulse: 71  Resp: 14  Temp: 97.9 F (36.6 C)  TempSrc: Oral  SpO2: 97%  Weight: 188 lb (85.3 kg)  Height: _0  (1.803 m)    Body mass index is 26.22 kg/m.  Physical Exam  Constitutional: Patient appears well-developed and well-nourished. Overweight.  No distress.  HEENT: head atraumatic, normocephalic, pupils equal and reactive to light,  neck supple, throat within normal limits Cardiovascular: Normal rate, irregular rhythm and normal heart sounds.  No murmur heard. Trace right lower extremity  edema. Pulmonary/Chest: Effort normal and breath sounds normal. No respiratory distress. Abdominal: Soft.  There is no tenderness. Psychiatric: Patient has a normal mood and affect. behavior is normal. Judgment and thought content normal. Muscular skeletal: normal gait   Recent Results (from the past 2160 hour(s))  CBC     Status: Abnormal   Collection Time: 10/28/17  2:56 PM  Result Value Ref Range   WBC 7.0 4.0 - 10.5 K/uL   RBC 4.18 (L) 4.22 - 5.81 MIL/uL   Hemoglobin 14.6 13.0 - 17.0 g/dL   HCT 40.2 39.0 - 52.0 %   MCV 96.2 78.0 - 100.0 fL   MCH 34.9 (H) 26.0 - 34.0 pg   MCHC 36.3 (H) 30.0 - 36.0 g/dL   RDW 12.2 11.5 - 15.5 %   Platelets 651 (H) 150 - 400 K/uL    Comment: Performed at [facility] [address], [place], [place] [address]  Basic metabolic panel     Status: Abnormal   Collection Time: 10/28/17  2:56 PM  Result Value Ref Range   Sodium 141 135 - 145 mmol/L   Potassium 5.4 (H) 3.5 - 5.1 mmol/L    Comment: SPECIMEN HEMOLYZED. HEMOLYSIS MAY AFFECT  INTEGRITY OF RESULTS.   Chloride 108 98 - 111 mmol/L   CO2 24 22 - 32 mmol/L   Glucose, Bld 86 70 - 99 mg/dL   BUN 15 8 - 23 mg/dL   Creatinine, Ser 1.30 (H) 0.61 - 1.24 mg/dL   Calcium 8.9 8.9 - 10.3 mg/dL   GFR calc non Af Amer 51 (L) >60 mL/min   GFR calc Af Amer 59 (L) >60 mL/min    Comment: (NOTE) The eGFR has been calculated using the CKD EPI equation. This calculation has not been validated in all clinical situations. eGFR's persistently <60 mL/min signify possible Chronic Kidney Disease.    Anion gap 9 5 - 15    Comment: Performed at [facility] [address] [place], [place] [address]  PT- INR Day of Surgery     Status: None   Collection Time: 11/10/17 12:50 PM  Result Value Ref Range   Prothrombin Time 13.4 11.4 - 15.2 seconds   INR 1.03     Comment: Performed at [facility], [place] [address] [place], [place] [address]  PTT Day of Surgery     Status: None   Collection Time: 11/10/17 12:50 PM  Result Value Ref Range   aPTT 27 24 - 36 seconds    Comment: Performed at [facility] [address], [place], [place] [address]     PHQ2/9: Depression screen [facility] 2/9 01/12/2018 09/11/2017 06/17/2017 02/19/2017 12/09/2016  Decreased Interest 0 0 0 0 0  Down, Depressed, Hopeless 0 0 0 0 0  PHQ - 2 Score 0 0 0 0 0  Altered sleeping 0 - - - -  Tired, decreased energy 0 - - - -  Change in appetite 0 - - - -  Feeling bad or failure about yourself  0 - - - -  Trouble concentrating 0 - - - -  Moving slowly or fidgety/restless 0 - - - -  Suicidal thoughts 0 - - - -  PHQ-9 Score 0 - - - -  Difficult doing work/chores Not difficult at all - - - -     Fall Risk: Fall Risk  01/12/2018 09/11/2017 07/17/2017 06/17/2017 05/21/2017  Falls in the past year? _0      Functional Status Survey: Is the patient deaf or have difficulty hearing?: No Does the patient have difficulty seeing, even when wearing glasses/contacts?: No Does the patient have difficulty  concentrating, remembering, or making decisions?: No Does the patient have difficulty walking or climbing stairs?: No Does the patient have difficulty dressing or bathing?: No Does the patient have difficulty doing errands alone such as visiting a doctor's office or shopping?: No    Assessment & Plan  1. Benign essential HTN  At goal  - losartan (COZAAR) 50 MG tablet; Take 1 tablet (50 mg total) by mouth daily.  Dispense: 90 tablet; Refill: 1 - COMPLETE METABOLIC PANEL WITH GFR  2. Paroxysmal atrial fibrillation (HCC)  Rate controlled - dabigatran (PRADAXA) 150 MG CAPS capsule; Take 1 capsule (150 mg total) by mouth 2 (two) times daily.  Dispense: 60 capsule; Refill: 5  3. Atherosclerosis of aorta (HCC)  On statin and pradaxa off aspirin per Dr. Clayborn Bigness   4. Vitamin B12 deficiency  Continue supplementation three times a week   5. Hypercholesterolemia without hypertriglyceridemia   6. History of DVT of lower extremity   7. Thrombocytosis (HCC)  - CBC with Differential/Platelet  8. Benign prostatic hyperplasia (BPH) with urinary urgency  Seen by Dr. Bernardo Heater, elevated PSA but not indicated to have repeat level s  9. Late onset Alzheimer's disease without behavioral disturbance ([place])  Doing well, still able to drive on his own, came to visit by himself. Wife is the one that thinks he has memory problems  10. Chronic kidney disease, stage III (moderate) (HCC)  Recheck level  12. Lesion of pancreas  Repeat MRI in 06/2018  13. Hyperglycemia  - Hemoglobin A1c

## 2018-01-13 LAB — CBC WITH DIFFERENTIAL/PLATELET
BASOS ABS: 33 {cells}/uL (ref 0–200)
Basophils Relative: 0.5 %
EOS PCT: 0.9 %
Eosinophils Absolute: 59 cells/uL (ref 15–500)
HEMATOCRIT: 44.4 % (ref 38.5–50.0)
Hemoglobin: 14.9 g/dL (ref 13.2–17.1)
LYMPHS ABS: 1333 {cells}/uL (ref 850–3900)
MCH: 32.3 pg (ref 27.0–33.0)
MCHC: 33.6 g/dL (ref 32.0–36.0)
MCV: 96.1 fL (ref 80.0–100.0)
MONOS PCT: 6.8 %
MPV: 11.8 fL (ref 7.5–12.5)
NEUTROS PCT: 71.6 %
Neutro Abs: 4726 cells/uL (ref 1500–7800)
Platelets: 163 10*3/uL (ref 140–400)
RBC: 4.62 10*6/uL (ref 4.20–5.80)
RDW: 11.7 % (ref 11.0–15.0)
TOTAL LYMPHOCYTE: 20.2 %
WBC mixed population: 449 cells/uL (ref 200–950)
WBC: 6.6 10*3/uL (ref 3.8–10.8)

## 2018-01-13 LAB — COMPLETE METABOLIC PANEL WITH GFR
AG RATIO: 1.3 (calc) (ref 1.0–2.5)
ALKALINE PHOSPHATASE (APISO): 75 U/L (ref 40–115)
ALT: 21 U/L (ref 9–46)
AST: 27 U/L (ref 10–35)
Albumin: 4 g/dL (ref 3.6–5.1)
BUN/Creatinine Ratio: 12 (calc) (ref 6–22)
BUN: 15 mg/dL (ref 7–25)
CO2: 30 mmol/L (ref 20–32)
CREATININE: 1.24 mg/dL — AB (ref 0.70–1.11)
Calcium: 9.3 mg/dL (ref 8.6–10.3)
Chloride: 106 mmol/L (ref 98–110)
GFR, Est African American: 63 mL/min/{1.73_m2} (ref >=60)
GFR, Est Non African American: 55 mL/min/{1.73_m2} — ABNORMAL LOW (ref >=60)
GLOBULIN: 3 g/dL (ref 1.9–3.7)
Glucose, Bld: 97 mg/dL (ref 65–99)
POTASSIUM: 3.8 mmol/L (ref 3.5–5.3)
SODIUM: 141 mmol/L (ref 135–146)
Total Bilirubin: 0.9 mg/dL (ref 0.2–1.2)
Total Protein: 7 g/dL (ref 6.1–8.1)

## 2018-01-13 LAB — HEMOGLOBIN A1C
EAG (MMOL/L): 6.6 (calc)
HEMOGLOBIN A1C: 5.8 %{Hb} — AB (ref <5.7)
MEAN PLASMA GLUCOSE: 120 (calc)

## 2018-02-20 ENCOUNTER — Ambulatory Visit (INDEPENDENT_AMBULATORY_CARE_PROVIDER_SITE_OTHER): Payer: Medicare PPO

## 2018-02-20 VITALS — BP 140/80 | HR 69 | Temp 97.3°F | Resp 16 | Ht 71.0 in | Wt 193.6 lb

## 2018-02-20 DIAGNOSIS — Z0000 Encounter for general adult medical examination without abnormal findings: Secondary | ICD-10-CM

## 2018-02-20 DIAGNOSIS — E875 Hyperkalemia: Secondary | ICD-10-CM | POA: Diagnosis not present

## 2018-02-20 DIAGNOSIS — D473 Essential (hemorrhagic) thrombocythemia: Secondary | ICD-10-CM | POA: Diagnosis not present

## 2018-02-20 LAB — CBC WITH DIFFERENTIAL/PLATELET
BASOS PCT: 0.4 %
Basophils Absolute: 22 cells/uL (ref 0–200)
EOS ABS: 72 {cells}/uL (ref 15–500)
Eosinophils Relative: 1.3 %
HEMATOCRIT: 42.6 % (ref 38.5–50.0)
Hemoglobin: 14.2 g/dL (ref 13.2–17.1)
LYMPHS ABS: 1166 {cells}/uL (ref 850–3900)
MCH: 32.1 pg (ref 27.0–33.0)
MCHC: 33.3 g/dL (ref 32.0–36.0)
MCV: 96.2 fL (ref 80.0–100.0)
MPV: 11.6 fL (ref 7.5–12.5)
Monocytes Relative: 8.7 %
Neutro Abs: 3762 cells/uL (ref 1500–7800)
Neutrophils Relative %: 68.4 %
PLATELETS: 142 10*3/uL (ref 140–400)
RBC: 4.43 10*6/uL (ref 4.20–5.80)
RDW: 11.7 % (ref 11.0–15.0)
TOTAL LYMPHOCYTE: 21.2 %
WBC: 5.5 10*3/uL (ref 3.8–10.8)
WBCMIX: 479 {cells}/uL (ref 200–950)

## 2018-02-20 LAB — POTASSIUM: Potassium: 4 mmol/L (ref 3.5–5.3)

## 2018-02-20 NOTE — Progress Notes (Signed)
Subjective:   Ronald Fleming is a 82 y.o. male who presents for Medicare Annual/Subsequent preventive examination.  Review of Systems:   Cardiac Risk Factors include: advanced age (>62men, >91 women);dyslipidemia;hypertension;male gender     Objective:    Vitals: BP 140/80 (BP Location: Right Arm, Patient Position: Sitting, Cuff Size: Normal)   Pulse 69   Temp (!) 97.3 F (36.3 C) (Oral)   Resp 16   Ht 5\' 11"  (1.803 m)   Wt 193 lb 9.6 oz (87.8 kg)   SpO2 98%   BMI 27.00 kg/m   Body mass index is 27 kg/m.  Advanced Directives 02/20/2018 10/28/2017 12/09/2016 09/10/2016 05/29/2016 02/12/2016 09/22/2015  Does Patient Have a Medical Advance Directive? Yes Yes Yes Yes Yes Yes Yes  Type of Paramedic of [place];Living will [place];Living will [place];Living will [place];Living will Living will [place];Living will [place];Living will  Does patient want to make changes to medical advance directive? - No - Patient declined - - - - No - Patient declined  Copy of [place] in Chart? No - copy requested No - copy requested No - copy requested - No - copy requested - -    Tobacco Social History   Tobacco Use  Smoking Status Never Smoker  Smokeless Tobacco Never Used     Counseling given: Not Answered   Clinical Intake:  Pre-visit preparation completed: Yes  Pain : No/denies pain     Nutritional Status: BMI 25 -29 Overweight Diabetes: No  How often do you need to have someone help you when you read instructions, pamphlets, or other written materials from your doctor or pharmacy?: 1 - Never What is the last grade level you completed in school?: 12th grade  Interpreter Needed?: No  Information entered by :: Ronald Fleming  Past Medical History:  Diagnosis Date  . Arrhythmia   . Arthritis   . Benign positional vertigo   . BPH  (benign prostatic hyperplasia)   . Deep vein blood clot of right lower extremity ([place])   . Dementia ([place])   . DVT (deep venous thrombosis) ([place])   . Dysrhythmia    ApFib  . Glaucoma   . Hyperlipidemia   . Hypertension   . Neuropathy    Past Surgical History:  Procedure Laterality Date  . INGUINAL HERNIA REPAIR Right 11/10/2017   Procedure: LAPAROSCOPIC RIGHT  INGUINAL HERNIA REPAIR;  Surgeon: Ralene Ok, MD;  Location: [facility];  Service: General;  Laterality: Right;  . INSERTION OF MESH Right 11/10/2017   Procedure: INSERTION OF MESH;  Surgeon: Ralene Ok, MD;  Location: [facility];  Service: General;  Laterality: Right;  . NO PAST SURGERIES     Family History  Problem Relation Age of Onset  . Coronary artery disease Father   . Stroke Son   . Kidney disease Neg Hx   . Prostate cancer Neg Hx   . Bladder Cancer Neg Hx    Social History   Socioeconomic History  . Marital status: Married    Spouse name: Ronald Fleming  . Number of children: 3  . Years of education: Not on file  . Highest education level: 12th grade  Occupational History  . 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 Financial resource strain: Not very hard  . Food insecurity:    Worry:  Never true    Inability: Never true  . Transportation needs:    Medical: No    Non-medical: No  Tobacco Use  . Smoking status: Never Smoker  . Smokeless tobacco: Never Used  Substance and Sexual Activity  . Alcohol use: Never    Alcohol/week: 0.0 standard drinks    Frequency: Never  . Drug use: No  . Sexual activity: Not Currently    Partners: Female  Lifestyle  . Physical activity:    Days per week: 0 days    Minutes per session: 0 min  . Stress: Not at all  Relationships  . Social connections:    Talks on phone: More than three times a week    Gets together: Three times a week    Attends religious service: Never    Active member of club or  organization: Yes    Attends meetings of clubs or organizations: More than 4 times per year    Relationship status: Married  Other Topics Concern  . Not on file  Social History Narrative   Married for 50 plus years.    They have two children that live in [place] and one son that lives in [place].    Outpatient Encounter Medications as of 02/20/2018  Medication Sig  . atorvastatin (LIPITOR) 20 MG tablet TAKE 1 TABLET BY MOUTH ONCE DAILY FOR CHOLESTEROL (IN PLACE OF LOVASTATIN)  . Cholecalciferol (VITAMIN D) 2000 units CAPS Take 1 capsule (2,000 Units total) by mouth daily.  . dabigatran (PRADAXA) 150 MG CAPS capsule Take 1 capsule (150 mg total) by mouth 2 (two) times daily.  Marland Kitchen galantamine (RAZADYNE) 12 MG tablet Take 12 mg by mouth 2 (two) times daily.   Marland Kitchen losartan (COZAAR) 50 MG tablet Take 1 tablet (50 mg total) by mouth daily.  . tamsulosin (FLOMAX) 0.4 MG CAPS capsule Take 0.4 mg by mouth daily.  . vitamin B-12 (CYANOCOBALAMIN) 1000 MCG tablet Take 1,000 mcg by mouth 3 (three) times a week.   . traMADol (ULTRAM) 50 MG tablet Take 1 tablet (50 mg total) by mouth every 6 (six) hours as needed. (Patient not taking: Reported on 02/20/2018)   No facility-administered encounter medications on file as of 02/20/2018.     Activities of Daily Living In your present state of health, do you have any difficulty performing the following activities: 02/20/2018 01/12/2018  Hearing? N N  Comment declines hearing aids -  Vision? N N  Comment wears reading glasses -  Difficulty concentrating or making decisions? N N  Comment - -  Walking or climbing stairs? N N  Dressing or bathing? N N  Doing errands, shopping? N N  Preparing Food and eating ? N -  Using the Toilet? N -  In the past six months, have you accidently leaked urine? N -  Do you have problems with loss of bowel control? N -  Managing your Medications? N -  Managing your Finances? N -  Housekeeping or managing your Housekeeping? N -    Some recent data might be hidden    Patient Care Team: Steele Sizer, MD as PCP - General (Family Medicine) Yolonda Kida, MD as Consulting Physician (Cardiology) Vladimir Crofts, MD as Consulting Physician (Neurology) Ardis Hughs, MD as Attending Physician (Urology)   Assessment:   This is a routine wellness examination for Ronald Fleming.  Exercise Activities and Dietary recommendations Current Exercise Habits: The patient does not participate in regular exercise at present, Exercise limited by: None identified  Goals    . Increase physical activity     Increase physical activity to 150 minutes per week       Fall Risk Fall Risk  02/20/2018 01/12/2018 09/11/2017 07/17/2017 06/17/2017  Falls in the past year? 0 No No No No  Number falls in past yr: 0 - - - -   FALL RISK PREVENTION PERTAINING TO THE HOME:  Any stairs in or around the home WITH handrails? No  Home free of loose throw rugs in walkways, pet beds, electrical cords, etc? Yes  Adequate lighting in your home to reduce risk of falls? Yes   ASSISTIVE DEVICES UTILIZED TO PREVENT FALLS:  Life alert? No  Use of a cane, walker or w/c? No  Grab bars in the bathroom? No  Shower chair or bench in shower? No  Elevated toilet seat or a handicapped toilet? No   DME ORDERS:  DME order needed?  No   TIMED UP AND GO:  Was the test performed? Yes .  Length of time to ambulate 10 feet: 5 sec.   GAIT:  Appearance of gait: Gait stead-fast and without the use of an assistive device.  Education: Fall risk prevention has been discussed.  Intervention(s) required? No   Depression Screen PHQ 2/9 Scores 02/20/2018 01/12/2018 09/11/2017 06/17/2017  PHQ - 2 Score 0 0 0 0  PHQ- 9 Score 0 0 - -    Cognitive Function     6CIT Screen 02/20/2018  What Year? 0 points  What month? 0 points  What time? 0 points  Count back from 20 0 points  Months in reverse 0 points  Repeat phrase 0 points  Total Score 0    Immunization  History  Administered Date(s) Administered  . Influenza Split 03/25/2008, 12/19/2008, 11/29/2009  . Influenza, High Dose Seasonal PF 04/17/2015, 12/29/2015, 12/09/2016, 12/31/2017  . Influenza, Seasonal, Injecte, Preservative Fre 12/18/2010, 12/06/2011  . Pneumococcal Conjugate-13 05/25/2013  . Pneumococcal Polysaccharide-23 11/29/2009  . Tdap 03/28/2010  . Zoster 11/09/2010    Qualifies for Shingles Vaccine? Yes  Zostavax completed 2012. Due for Shingrix. Education has been provided regarding the importance of this vaccine. Pt has been advised to call insurance company to determine out of pocket expense. Advised may also receive vaccine at local pharmacy or Health Dept. Verbalized acceptance and understanding.  Tdap: Up to date  Flu Vaccine: Up to date  Pneumococcal Vaccine: Up to date   Screening Tests Health Maintenance  Topic Date Due  . TETANUS/TDAP  03/28/2020  . INFLUENZA VACCINE  Completed  . PNA vac Low Risk Adult  Completed   Cancer Screenings:  Colorectal Screening:  No longer required.   Lung Cancer Screening: (Low Dose CT Chest recommended if Age 18-80 years, 30 pack-year currently smoking OR have quit w/in 15years.) does not qualify.    Additional Screening:  Hepatitis C Screening: no longer required  Vision Screening: Recommended annual ophthalmology exams for early detection of glaucoma and other disorders of the eye. Is the patient up to date with their annual eye exam?  Yes  Who is the provider or what is the name of the office in which the pt attends annual eye exams? [facility] Screening: Recommended annual dental exams for proper oral hygiene  Community Resource Referral:  CRR required this visit?  No  - pt c/o paying approximately $113 for pradaxa. Printed copay card for part d coverage for one month free for patient to take to pharmacy. Advised pt to  notify us regarding copay assistance if needed.       Plan:    I have  personally reviewed and addressed the Medicare Annual Wellness questionnaire and have noted the following in the patient's chart:  A. Medical and social history B. Use of alcohol, tobacco or illicit drugs  C. Current medications and supplements D. Functional ability and status E.  Nutritional status F.  Physical activity G. Advance directives H. List of other physicians I.  Hospitalizations, surgeries, and ER visits in previous 12 months J.  [place] such as hearing and vision if needed, cognitive and depression L. Referrals and appointments   In addition, I have reviewed and discussed with patient certain preventive protocols, quality metrics, and best practice recommendations. A written personalized care plan for preventive services as well as general preventive health recommendations were provided to patient.   Signed,  Ronald Marker, Fleming Nurse Health Advisor   Nurse Notes: none

## 2018-02-20 NOTE — Patient Instructions (Signed)
Ronald Fleming , Thank you for taking time to come for your Medicare Wellness Visit. I appreciate your ongoing commitment to your health goals. Please review the following plan we discussed and let me know if I can assist you in the future.   Screening recommendations/referrals: Recommended yearly ophthalmology/optometry visit for glaucoma screening and checkup Recommended yearly dental visit for hygiene and checkup  Vaccinations: Influenza vaccine: done 12/31/17 Pneumococcal vaccine: done 05/25/13 Tdap vaccine: done 03/28/10 Shingles vaccine: Zostavax done 2012. Shingrix discussed. Please contact your insurance company or pharmacy for coverage information.     Advanced directives: Please bring a copy of your health care power of attorney and living will to the office at your convenience.  Conditions/risks identified: Recommend increasing physical activity.   Next appointment: 05/15/2018 10:00 Dr. Ancil Boozer  Preventive Care 15 Years and Older, Male Preventive care refers to lifestyle choices and visits with your health care provider that can promote health and wellness. What does preventive care include?  A yearly physical exam. This is also called an annual well check.  Dental exams once or twice a year.  Routine eye exams. Ask your health care provider how often you should have your eyes checked.  Personal lifestyle choices, including:  Daily care of your teeth and gums.  Regular physical activity.  Eating a healthy diet.  Avoiding tobacco and drug use.  Limiting alcohol use.  Practicing safe sex.  Taking low doses of aspirin every day.  Taking vitamin and mineral supplements as recommended by your health care provider. What happens during an annual well check? The services and screenings done by your health care provider during your annual well check will depend on your age, overall health, lifestyle risk factors, and family history of disease. Counseling  Your health care  provider may ask you questions about your:  Alcohol use.  Tobacco use.  Drug use.  Emotional well-being.  Home and relationship well-being.  Sexual activity.  Eating habits.  History of falls.  Memory and ability to understand (cognition).  Work and work Statistician. Screening  You may have the following tests or measurements:  Height, weight, and BMI.  Blood pressure.  Lipid and cholesterol levels. These may be checked every 5 years, or more frequently if you are over 51 years old.  Skin check.  Lung cancer screening. You may have this screening every year starting at age 50 if you have a 30-pack-year history of smoking and currently smoke or have quit within the past 15 years.  Fecal occult blood test (FOBT) of the stool. You may have this test every year starting at age 1.  Flexible sigmoidoscopy or colonoscopy. You may have a sigmoidoscopy every 5 years or a colonoscopy every 10 years starting at age 32.  Prostate cancer screening. Recommendations will vary depending on your family history and other risks.  Hepatitis C blood test.  Hepatitis B blood test.  Sexually transmitted disease (STD) testing.  Diabetes screening. This is done by checking your blood sugar (glucose) after you have not eaten for a while (fasting). You may have this done every 1-3 years.  Abdominal aortic aneurysm (AAA) screening. You may need this if you are a current or former smoker.  Osteoporosis. You may be screened starting at age 95 if you are at high risk. Talk with your health care provider about your test results, treatment options, and if necessary, the need for more tests. Vaccines  Your health care provider may recommend certain vaccines, such as:  Influenza  vaccine. This is recommended every year.  Tetanus, diphtheria, and acellular pertussis (Tdap, Td) vaccine. You may need a Td booster every 10 years.  Zoster vaccine. You may need this after age 46.  Pneumococcal  13-valent conjugate (PCV13) vaccine. One dose is recommended after age 22.  Pneumococcal polysaccharide (PPSV23) vaccine. One dose is recommended after age 2. Talk to your health care provider about which screenings and vaccines you need and how often you need them. This information is not intended to replace advice given to you by your health care provider. Make sure you discuss any questions you have with your health care provider. Document Released: 03/31/2015 Document Revised: 11/22/2015 Document Reviewed: 01/03/2015 Elsevier Interactive Patient Education  2017 [place] Prevention in the Home Falls can cause injuries. They can happen to people of all ages. There are many things you can do to make your home safe and to help prevent falls. What can I do on the outside of my home?  Regularly fix the edges of walkways and driveways and fix any cracks.  Remove anything that might make you trip as you walk through a door, such as a raised step or threshold.  Trim any bushes or trees on the path to your home.  Use bright outdoor lighting.  Clear any walking paths of anything that might make someone trip, such as rocks or tools.  Regularly check to see if handrails are loose or broken. Make sure that both sides of any steps have handrails.  Any raised decks and porches should have guardrails on the edges.  Have any leaves, snow, or ice cleared regularly.  Use sand or salt on walking paths during winter.  Clean up any spills in your garage right away. This includes oil or grease spills. What can I do in the bathroom?  Use night lights.  Install grab bars by the toilet and in the tub and shower. Do not use towel bars as grab bars.  Use non-skid mats or decals in the tub or shower.  If you need to sit down in the shower, use a plastic, non-slip stool.  Keep the floor dry. Clean up any water that spills on the floor as soon as it happens.  Remove soap buildup in the  tub or shower regularly.  Attach bath mats securely with double-sided non-slip rug tape.  Do not have throw rugs and other things on the floor that can make you trip. What can I do in the bedroom?  Use night lights.  Make sure that you have a light by your bed that is easy to reach.  Do not use any sheets or blankets that are too big for your bed. They should not hang down onto the floor.  Have a firm chair that has side arms. You can use this for support while you get dressed.  Do not have throw rugs and other things on the floor that can make you trip. What can I do in the kitchen?  Clean up any spills right away.  Avoid walking on wet floors.  Keep items that you use a lot in easy-to-reach places.  If you need to reach something above you, use a strong step stool that has a grab bar.  Keep electrical cords out of the way.  Do not use floor polish or wax that makes floors slippery. If you must use wax, use non-skid floor wax.  Do not have throw rugs and other things on the floor that can  make you trip. What can I do with my stairs?  Do not leave any items on the stairs.  Make sure that there are handrails on both sides of the stairs and use them. Fix handrails that are broken or loose. Make sure that handrails are as long as the stairways.  Check any carpeting to make sure that it is firmly attached to the stairs. Fix any carpet that is loose or worn.  Avoid having throw rugs at the top or bottom of the stairs. If you do have throw rugs, attach them to the floor with carpet tape.  Make sure that you have a light switch at the top of the stairs and the bottom of the stairs. If you do not have them, ask someone to add them for you. What else can I do to help prevent falls?  Wear shoes that:  Do not have high heels.  Have rubber bottoms.  Are comfortable and fit you well.  Are closed at the toe. Do not wear sandals.  If you use a stepladder:  Make sure that it  is fully opened. Do not climb a closed stepladder.  Make sure that both sides of the stepladder are locked into place.  Ask someone to hold it for you, if possible.  Clearly mark and make sure that you can see:  Any grab bars or handrails.  First and last steps.  Where the edge of each step is.  Use tools that help you move around (mobility aids) if they are needed. These include:  Canes.  Walkers.  Scooters.  Crutches.  Turn on the lights when you go into a dark area. Replace any light bulbs as soon as they burn out.  Set up your furniture so you have a clear path. Avoid moving your furniture around.  If any of your floors are uneven, fix them.  If there are any pets around you, be aware of where they are.  Review your medicines with your doctor. Some medicines can make you feel dizzy. This can increase your chance of falling. Ask your doctor what other things that you can do to help prevent falls. This information is not intended to replace advice given to you by your health care provider. Make sure you discuss any questions you have with your health care provider. Document Released: 12/29/2008 Document Revised: 08/10/2015 Document Reviewed: 04/08/2014 Elsevier Interactive Patient Education  2017 [employer].

## 2018-03-10 ENCOUNTER — Other Ambulatory Visit: Payer: Self-pay | Admitting: Family Medicine

## 2018-03-12 NOTE — Telephone Encounter (Signed)
Refill request for general medication: Flomax 0.4 mg  Last office visit: 02/20/2018   Lab Results  Component Value Date   PSA 4.5 (H) 05/21/2017   PSA 5.1 (H) 02/12/2016    Last physical exam: 02/20/2018  Follow-ups on file. 05/15/2018

## 2018-04-08 ENCOUNTER — Other Ambulatory Visit: Payer: Self-pay | Admitting: Family Medicine

## 2018-04-08 DIAGNOSIS — E785 Hyperlipidemia, unspecified: Secondary | ICD-10-CM

## 2018-04-15 DIAGNOSIS — E785 Hyperlipidemia, unspecified: Secondary | ICD-10-CM | POA: Diagnosis not present

## 2018-04-15 DIAGNOSIS — E7800 Pure hypercholesterolemia, unspecified: Secondary | ICD-10-CM | POA: Diagnosis not present

## 2018-04-15 DIAGNOSIS — I10 Essential (primary) hypertension: Secondary | ICD-10-CM | POA: Diagnosis not present

## 2018-04-15 DIAGNOSIS — R0602 Shortness of breath: Secondary | ICD-10-CM | POA: Diagnosis not present

## 2018-04-15 DIAGNOSIS — I480 Paroxysmal atrial fibrillation: Secondary | ICD-10-CM | POA: Diagnosis not present

## 2018-04-15 DIAGNOSIS — R011 Cardiac murmur, unspecified: Secondary | ICD-10-CM | POA: Diagnosis not present

## 2018-04-15 DIAGNOSIS — R002 Palpitations: Secondary | ICD-10-CM | POA: Diagnosis not present

## 2018-04-15 DIAGNOSIS — I208 Other forms of angina pectoris: Secondary | ICD-10-CM | POA: Diagnosis not present

## 2018-04-15 DIAGNOSIS — I495 Sick sinus syndrome: Secondary | ICD-10-CM | POA: Diagnosis not present

## 2018-05-13 ENCOUNTER — Encounter: Payer: Self-pay | Admitting: Family Medicine

## 2018-05-13 ENCOUNTER — Ambulatory Visit: Payer: Medicare PPO | Admitting: Family Medicine

## 2018-05-13 VITALS — BP 150/80 | HR 78 | Temp 98.0°F | Resp 16 | Ht 71.0 in | Wt 186.2 lb

## 2018-05-13 DIAGNOSIS — I700 Atherosclerosis of aorta: Secondary | ICD-10-CM | POA: Diagnosis not present

## 2018-05-13 DIAGNOSIS — N401 Enlarged prostate with lower urinary tract symptoms: Secondary | ICD-10-CM | POA: Diagnosis not present

## 2018-05-13 DIAGNOSIS — R739 Hyperglycemia, unspecified: Secondary | ICD-10-CM | POA: Diagnosis not present

## 2018-05-13 DIAGNOSIS — I10 Essential (primary) hypertension: Secondary | ICD-10-CM

## 2018-05-13 DIAGNOSIS — M5137 Other intervertebral disc degeneration, lumbosacral region: Secondary | ICD-10-CM

## 2018-05-13 DIAGNOSIS — D473 Essential (hemorrhagic) thrombocythemia: Secondary | ICD-10-CM

## 2018-05-13 DIAGNOSIS — N183 Chronic kidney disease, stage 3 unspecified: Secondary | ICD-10-CM

## 2018-05-13 DIAGNOSIS — E538 Deficiency of other specified B group vitamins: Secondary | ICD-10-CM | POA: Diagnosis not present

## 2018-05-13 DIAGNOSIS — G301 Alzheimer's disease with late onset: Secondary | ICD-10-CM | POA: Diagnosis not present

## 2018-05-13 DIAGNOSIS — R001 Bradycardia, unspecified: Secondary | ICD-10-CM

## 2018-05-13 DIAGNOSIS — D75839 Thrombocytosis, unspecified: Secondary | ICD-10-CM

## 2018-05-13 DIAGNOSIS — R7303 Prediabetes: Secondary | ICD-10-CM

## 2018-05-13 DIAGNOSIS — E7800 Pure hypercholesterolemia, unspecified: Secondary | ICD-10-CM | POA: Diagnosis not present

## 2018-05-13 DIAGNOSIS — Z86718 Personal history of other venous thrombosis and embolism: Secondary | ICD-10-CM

## 2018-05-13 DIAGNOSIS — R3915 Urgency of urination: Secondary | ICD-10-CM

## 2018-05-13 DIAGNOSIS — K862 Cyst of pancreas: Secondary | ICD-10-CM

## 2018-05-13 DIAGNOSIS — I480 Paroxysmal atrial fibrillation: Secondary | ICD-10-CM | POA: Diagnosis not present

## 2018-05-13 DIAGNOSIS — F0280 Dementia in other diseases classified elsewhere without behavioral disturbance: Secondary | ICD-10-CM

## 2018-05-13 MED ORDER — LOSARTAN POTASSIUM 50 MG PO TABS: 50.0000 mg | ORAL_TABLET | Freq: Every day | ORAL | 1 refills | Status: DC

## 2018-05-13 MED ORDER — DABIGATRAN ETEXILATE MESYLATE 150 MG PO CAPS: 150.0000 mg | ORAL_CAPSULE | Freq: Two times a day (BID) | ORAL | 5 refills | Status: DC

## 2018-05-13 NOTE — Progress Notes (Signed)
Name: Ronald Fleming   MRN: [id number]    DOB: Jul 19, 1935   Date:05/13/2018       Progress Note  Subjective  Chief Complaint  Chief Complaint  Patient presents with  . Hypertension    HPI  HTN:BP was high when he first came in today.  No chest pain or palpitation, no swelling today ( history of DVT right leg) No side effects of medication   Hyperlipidemia:taking statins, denies side effects,last LDL at goal. He also has atherosclerosis of aorta, is on statin therapy , off aspirin, but is on pradaxa, we will recheck labs today    Alzheimer's late onset: wife noticed he had memory loss, seen by neurologist and is now on Razadyne12mg  twice dailye has been on medicationsince Fall 2017,doing well on medications.His weight is stable now, appetite is better again, he forgets to eat sometimes   Afib: doing well, no SOB or palpitation, on Pradaxa and denies easy bleedingor bruising.Seen by Dr. Clayborn Bigness 03/2018  and was advised to stop aspirin. Rate controlled, heart rate was low when he came in but checked by pulse ox, normal when we used stethoscope  BPH: he has urinary frequency during the day,he also has nocturia twice per night, he is on Flomax,states able to fall back asleep, sees dr. Bernardo Heater. Recently had hematuria and found to have kidney stones. Found to have a cyst on pancreas and we will recheck in April as recommended  Chronic DVT right leg, also has varicose veins, no pain or discomfort at this time   Patient Active Problem List   Diagnosis Date Noted  . Thrombocytosis ([place]) 10/29/2017  . DDD (degenerative disc disease), lumbosacral 09/11/2017  . Nephrolithiasis 09/11/2017  . Inguinal hernia of right side without obstruction or gangrene 09/11/2017  . Vitamin D deficiency 09/11/2017  . Lesion of pancreas 07/09/2017  . Atherosclerosis of aorta ([place]) 07/01/2017  . Osteopenia 05/21/2017  . Atrioventricular block, second degree 05/29/2016  . Vitamin B12 deficiency  05/21/2016  . Late onset Alzheimer's disease without behavioral disturbance ([place]) 05/21/2016  . Vitiligo 02/12/2016  . History of DVT of lower extremity 08/03/2015  . BPH (benign prostatic hyperplasia) 08/03/2015  . Atrial fibrillation ([place]) 02/01/2015  . Edema leg 08/23/2014  . Neuropathy 08/23/2014  . Benign essential HTN 08/27/2006  . Hypercholesterolemia without hypertriglyceridemia 08/27/2006    Past Surgical History:  Procedure Laterality Date  . INGUINAL HERNIA REPAIR Right 11/10/2017   Procedure: LAPAROSCOPIC RIGHT  INGUINAL HERNIA REPAIR;  Surgeon: Ralene Ok, MD;  Location: [place] [facility];  Service: General;  Laterality: Right;  . INSERTION OF MESH Right 11/10/2017   Procedure: INSERTION OF MESH;  Surgeon: Ralene Ok, MD;  Location: [place];  Service: General;  Laterality: Right;  . NO PAST SURGERIES      Family History  Problem Relation Age of Onset  . Coronary artery disease Father   . Stroke Son   . Kidney disease Neg Hx   . Prostate cancer Neg Hx   . Bladder Cancer Neg Hx     Social History   Socioeconomic History  . Marital status: Married    Spouse name: Arlina Robes  . Number of children: 3  . Years of education: Not on file  . Highest education level: 12th grade  Occupational History  . 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 Financial resource strain: Not very hard  . Food insecurity:    Worry: Never true  Inability: Never true  . Transportation needs:    Medical: No    Non-medical: No  Tobacco Use  . Smoking status: Never Smoker  . Smokeless tobacco: Never Used  Substance and Sexual Activity  . Alcohol use: Never    Alcohol/week: 0.0 standard drinks    Frequency: Never  . Drug use: No  . Sexual activity: Not Currently    Partners: Female  Lifestyle  . Physical activity:    Days per week: 0 days    Minutes per session: 0 min  . Stress: Not at all  Relationships  .  Social connections:    Talks on phone: More than three times a week    Gets together: Three times a week    Attends religious service: Never    Active member of club or organization: Yes    Attends meetings of clubs or organizations: More than 4 times per year    Relationship status: Married  . Intimate partner violence:    Fear of current or ex partner: No    Emotionally abused: No    Physically abused: No    Forced sexual activity: No  Other Topics Concern  . Not on file  Social History Narrative   Married for 50 plus years.    They have two children that live in [place] and one son that lives in [place].     Current Outpatient Medications:  .  atorvastatin (LIPITOR) 20 MG tablet, TAKE 1 TABLET BY MOUTH ONCE DAILY FOR CHOLESTEROL (IN PLACE OF LOVASTATIN), Disp: 90 tablet, Rfl: 1 .  Cholecalciferol (VITAMIN D) 2000 units CAPS, Take 1 capsule (2,000 Units total) by mouth daily., Disp: 30 capsule, Rfl: 0 .  dabigatran (PRADAXA) 150 MG CAPS capsule, Take 1 capsule (150 mg total) by mouth 2 (two) times daily., Disp: 60 capsule, Rfl: 5 .  galantamine (RAZADYNE) 12 MG tablet, Take 12 mg by mouth 2 (two) times daily. , Disp: , Rfl:  .  losartan (COZAAR) 50 MG tablet, Take 1 tablet (50 mg total) by mouth daily., Disp: 90 tablet, Rfl: 1 .  tamsulosin (FLOMAX) 0.4 MG CAPS capsule, TAKE 1 CAPSULE (0.4 MG TOTAL) BY MOUTH DAILY., Disp: 90 capsule, Rfl: 1 .  vitamin B-12 (CYANOCOBALAMIN) 1000 MCG tablet, Take 1,000 mcg by mouth 3 (three) times a week. , Disp: , Rfl:   No Known Allergies  I personally reviewed active problem list, medication list, allergies, family history, social history with the patient/caregiver today.   ROS  Constitutional: Negative for fever, positive for mild  weight change. , per wife baseline in the 190 lbs Respiratory: Negative for cough and shortness of breath.   Cardiovascular: Negative for chest pain or palpitations.  Gastrointestinal: Negative for abdominal pain,  no bowel changes.  Musculoskeletal: Negative for gait problem or joint swelling.  Skin: Negative for rash.  Neurological: Negative for dizziness or headache.  No other specific complaints in a complete review of systems (except as listed in HPI above).  Objective  Vitals:   05/13/18 1047 05/13/18 1120  BP: (!) 150/80   Pulse: (!) 47 78  Resp: 16   Temp: 98 F (36.7 C)   TempSrc: Oral   SpO2: 95%   Weight: 186 lb 3.2 oz (84.5 kg)   Height: 5\' 11"  (1.803 m)     Body mass index is 25.97 kg/m.  Physical Exam  Constitutional: Patient appears well-developed and well-nourished. No distress.  HEENT: head atraumatic, normocephalic, pupils equal and reactive to light, neck  supple, throat within normal limits Cardiovascular: Normal rate, irregular rhythm and normal heart sounds.  No murmur heard. No BLE edema. Pulmonary/Chest: Effort normal and breath sounds normal. No respiratory distress. Abdominal: Soft.  There is no tenderness. Psychiatric: Patient has a normal mood and affect. behavior is normal. Judgment and thought content normal.  Recent Results (from the past 2160 hour(s))  CBC with Differential/Platelet     Status: None   Collection Time: 02/20/18  9:11 AM  Result Value Ref Range   WBC 5.5 3.8 - 10.8 Thousand/uL   RBC 4.43 4.20 - 5.80 Million/uL   Hemoglobin 14.2 13.2 - 17.1 g/dL   HCT 42.6 38.5 - 50.0 %   MCV 96.2 80.0 - 100.0 fL   MCH 32.1 27.0 - 33.0 pg   MCHC 33.3 32.0 - 36.0 g/dL   RDW 11.7 11.0 - 15.0 %   Platelets 142 140 - 400 Thousand/uL   MPV 11.6 7.5 - 12.5 fL   Neutro Abs 3,762 1,500 - 7,800 cells/uL   Lymphs Abs 1,166 850 - 3,900 cells/uL   WBC mixed population 479 200 - 950 cells/uL   Eosinophils Absolute 72 15 - 500 cells/uL   Basophils Absolute 22 0 - 200 cells/uL   Neutrophils Relative % 68.4 %   Total Lymphocyte 21.2 %   Monocytes Relative 8.7 %   Eosinophils Relative 1.3 %   Basophils Relative 0.4 %  Potassium     Status: None   Collection  Time: 02/20/18  9:11 AM  Result Value Ref Range   Potassium 4.0 3.5 - 5.3 mmol/L      PHQ2/9: Depression screen [facility] 2/9 05/13/2018 02/20/2018 01/12/2018 09/11/2017 06/17/2017  Decreased Interest 0 0 0 0 0  Down, Depressed, Hopeless 0 0 0 0 0  PHQ - 2 Score 0 0 0 0 0  Altered sleeping 0 0 0 - -  Tired, decreased energy 0 0 0 - -  Change in appetite 0 0 0 - -  Feeling bad or failure about yourself  0 0 0 - -  Trouble concentrating 0 0 0 - -  Moving slowly or fidgety/restless 0 0 0 - -  Suicidal thoughts 0 0 0 - -  PHQ-9 Score 0 0 0 - -  Difficult doing work/chores Not difficult at all - Not difficult at all - -   Reviewed PHQ9 and negative   Fall Risk: Fall Risk  05/13/2018 02/20/2018 01/12/2018 09/11/2017 07/17/2017  Falls in the past year? 0 0 No No No  Number falls in past yr: 0 0 - - -  Injury with Fall? 0 - - - -      Assessment & Plan  1. Paroxysmal atrial fibrillation (HCC)  - dabigatran (PRADAXA) 150 MG CAPS capsule; Take 1 capsule (150 mg total) by mouth 2 (two) times daily.  Dispense: 60 capsule; Refill: 5  2. Benign essential HTN  BP is high today, usually at goal, recent visit with cardiologist showed normal bp , advised to return for bp check only with CMA in about week - losartan (COZAAR) 50 MG tablet; Take 1 tablet (50 mg total) by mouth daily.  Dispense: 90 tablet; Refill: 1  3. Atherosclerosis of aorta (HCC)  On statin therapy and pradaxa   4. Thrombocytosis ([place])  Resolved, recheck next visit   5. Chronic kidney disease, stage III (moderate) (HCC)  - COMPLETE METABOLIC PANEL WITH GFR - VITAMIN D 25 Hydroxy (Vit-D Deficiency, Fractures)  6. History of DVT of lower extremity  7. Benign prostatic hyperplasia (BPH) with urinary urgency  On Flomax, seen by Urologist last year, discussed options and he would like to hold off on checking PSA   8. Late onset Alzheimer's disease without behavioral disturbance ([place])  Under the care of Dr. Manuella Ghazi   9.  Vitamin B12 deficiency  - B12  10. Hypercholesterolemia without hypertriglyceridemia  - Lipid panel  11. DDD (degenerative disc disease), lumbosacral  stable  12. Cyst of pancreas  - MR ABDOMEN W WO CONTRAST; Future  13. Pre-diabetes  Discussed high calorie diet, just avoid sugars  14. Hyperglycemia  - Hemoglobin A1c  15. Bradycardia  He has afib , recheck heart rate with stethoscope was normal

## 2018-05-14 LAB — COMPLETE METABOLIC PANEL WITH GFR
AG RATIO: 1.4 (calc) (ref 1.0–2.5)
ALT: 19 U/L (ref 9–46)
AST: 24 U/L (ref 10–35)
Albumin: 4.3 g/dL (ref 3.6–5.1)
Alkaline phosphatase (APISO): 76 U/L (ref 35–144)
BILIRUBIN TOTAL: 1.2 mg/dL (ref 0.2–1.2)
BUN/Creatinine Ratio: 13 (calc) (ref 6–22)
BUN: 19 mg/dL (ref 7–25)
CALCIUM: 9.8 mg/dL (ref 8.6–10.3)
CHLORIDE: 107 mmol/L (ref 98–110)
CO2: 27 mmol/L (ref 20–32)
Creat: 1.51 mg/dL — ABNORMAL HIGH (ref 0.70–1.11)
GFR, EST AFRICAN AMERICAN: 50 mL/min/{1.73_m2} — AB (ref >=60)
GFR, EST NON AFRICAN AMERICAN: 43 mL/min/{1.73_m2} — AB (ref >=60)
GLOBULIN: 3 g/dL (ref 1.9–3.7)
Glucose, Bld: 88 mg/dL (ref 65–99)
POTASSIUM: 4.2 mmol/L (ref 3.5–5.3)
SODIUM: 143 mmol/L (ref 135–146)
Total Protein: 7.3 g/dL (ref 6.1–8.1)

## 2018-05-14 LAB — VITAMIN B12: Vitamin B-12: 694 pg/mL (ref 200–1100)

## 2018-05-14 LAB — LIPID PANEL
CHOL/HDL RATIO: 2.3 (calc) (ref <5.0)
Cholesterol: 157 mg/dL (ref <200)
HDL: 69 mg/dL (ref >=40)
LDL Cholesterol (Calc): 74 mg/dL (calc)
NON-HDL CHOLESTEROL (CALC): 88 mg/dL (ref <130)
TRIGLYCERIDES: 67 mg/dL (ref <150)

## 2018-05-14 LAB — HEMOGLOBIN A1C
EAG (MMOL/L): 6.3 (calc)
Hgb A1c MFr Bld: 5.6 % of total Hgb (ref <5.7)
Mean Plasma Glucose: 114 (calc)

## 2018-05-14 LAB — VITAMIN D 25 HYDROXY (VIT D DEFICIENCY, FRACTURES): VIT D 25 HYDROXY: 25 ng/mL — AB (ref 30–100)

## 2018-05-15 ENCOUNTER — Ambulatory Visit: Payer: Medicare PPO | Admitting: Family Medicine

## 2018-05-19 ENCOUNTER — Ambulatory Visit (INDEPENDENT_AMBULATORY_CARE_PROVIDER_SITE_OTHER): Payer: Medicare PPO

## 2018-05-19 VITALS — BP 130/70 | HR 79

## 2018-05-19 DIAGNOSIS — Z0130 Encounter for examination of blood pressure without abnormal findings: Secondary | ICD-10-CM

## 2018-05-19 NOTE — Progress Notes (Signed)
Patient is here for a blood pressure check. Patient denies chest pain, palpitations, shortness of breath or visual disturbances. At previous visit blood pressure was 150/80 with a heart rate of 78. Today during nurse visit first check blood pressure was 130/70 with a heart rate of 79.He does take blood pressure medications as prescribed with no missed doses.

## 2018-05-20 ENCOUNTER — Ambulatory Visit: Payer: Medicare PPO

## 2018-06-02 ENCOUNTER — Ambulatory Visit: Admission: RE | Admit: 2018-06-02 | Payer: Medicare PPO | Source: Ambulatory Visit

## 2018-09-11 ENCOUNTER — Other Ambulatory Visit: Payer: Self-pay | Admitting: Family Medicine

## 2018-09-11 NOTE — Telephone Encounter (Signed)
Refill request for general medication. Tamsulosin    Last office visit 05/13/2018   Follow up on 09/14/2018

## 2018-09-14 ENCOUNTER — Encounter: Payer: Self-pay | Admitting: Family Medicine

## 2018-09-14 ENCOUNTER — Ambulatory Visit (INDEPENDENT_AMBULATORY_CARE_PROVIDER_SITE_OTHER): Payer: Medicare PPO | Admitting: Family Medicine

## 2018-09-14 ENCOUNTER — Other Ambulatory Visit: Payer: Self-pay

## 2018-09-14 VITALS — BP 138/73 | HR 57

## 2018-09-14 DIAGNOSIS — E538 Deficiency of other specified B group vitamins: Secondary | ICD-10-CM | POA: Diagnosis not present

## 2018-09-14 DIAGNOSIS — I10 Essential (primary) hypertension: Secondary | ICD-10-CM

## 2018-09-14 DIAGNOSIS — G301 Alzheimer's disease with late onset: Secondary | ICD-10-CM | POA: Diagnosis not present

## 2018-09-14 DIAGNOSIS — D473 Essential (hemorrhagic) thrombocythemia: Secondary | ICD-10-CM | POA: Diagnosis not present

## 2018-09-14 DIAGNOSIS — I700 Atherosclerosis of aorta: Secondary | ICD-10-CM

## 2018-09-14 DIAGNOSIS — I480 Paroxysmal atrial fibrillation: Secondary | ICD-10-CM

## 2018-09-14 DIAGNOSIS — F0280 Dementia in other diseases classified elsewhere without behavioral disturbance: Secondary | ICD-10-CM

## 2018-09-14 DIAGNOSIS — D75839 Thrombocytosis, unspecified: Secondary | ICD-10-CM

## 2018-09-14 DIAGNOSIS — E559 Vitamin D deficiency, unspecified: Secondary | ICD-10-CM | POA: Diagnosis not present

## 2018-09-14 DIAGNOSIS — I82511 Chronic embolism and thrombosis of right femoral vein: Secondary | ICD-10-CM

## 2018-09-14 NOTE — Progress Notes (Signed)
Name: Ronald Fleming   MRN: [id number]    DOB: 1935/08/08   Date:09/14/2018       Progress Note  Subjective  Chief Complaint  Chief Complaint  Patient presents with  . Medication Refill  . Hypertension    Denies any symptoms  . Hyperlipidemia  . Alzheimer's late onset  . Benign Prostatic Hypertrophy  . Atrial Fibrillation    I connected with  [name] on 09/14/18 at 11:20 AM EDT by telephone and verified that I am speaking with the correct person using two identifiers.  I discussed the limitations, risks, security and privacy concerns of performing an evaluation and management service by telephone and the availability of in person appointments. Staff also discussed with the patient that there may be a patient responsible charge related to this service. Patient Location: at home  Provider Location: at [facility] Additional Individuals present: wife   HPI  HTN:BP at home has been at goal at home.  No chest pain or palpitation, no swelling today ( history of DVT right leg) No side effects of medication  Hyperlipidemia:taking statins, denies side effects,last LDL at goal. He also has atherosclerosis of aorta, is on statin therapy, off aspirin, but is on pradaxa. No side effects of medication   Alzheimer's late onset: wife noticed he had memory loss, seen by neurologist and is now on Razadyne12mg  twice dailye has been on medicationsince Fall 2017,doing well on medications.Hi appetite is normal. No wandering   Pre-diabetes: last hgbA1C was back to normal, he denies polyphagia, polydipsia or polyuria   Afib: doing well, no SOB or palpitation, on Pradaxa and denies easy bleedingor bruising.Seen by Dr. Clayborn Bigness 03/2018 and was advised to stop aspirin. Unchange   BPH: he has urinary frequency during the day,he also has nocturia twice per night, he is on Flomax,states able to fall back asleep, sees dr. Bernardo Heater. Recently had hematuria and found to  have kidney stones. Found to have a cyst on pancreas and he was supposed to have MRI April, but it was canceled because of COVID 19   Chronic DVT right leg, also has varicose veins, no pain or discomfort at this time . Unchanged , on Pradaxa   Patient Active Problem List   Diagnosis Date Noted  . Thrombocytosis ([place]) 10/29/2017  . DDD (degenerative disc disease), lumbosacral 09/11/2017  . Nephrolithiasis 09/11/2017  . Inguinal hernia of right side without obstruction or gangrene 09/11/2017  . Vitamin D deficiency 09/11/2017  . Lesion of pancreas 07/09/2017  . Atherosclerosis of aorta ([place]) 07/01/2017  . Osteopenia 05/21/2017  . Atrioventricular block, second degree 05/29/2016  . Vitamin B12 deficiency 05/21/2016  . Late onset Alzheimer's disease without behavioral disturbance ([place]) 05/21/2016  . Vitiligo 02/12/2016  . History of DVT of lower extremity 08/03/2015  . BPH (benign prostatic hyperplasia) 08/03/2015  . Atrial fibrillation ([place]) 02/01/2015  . Edema leg 08/23/2014  . Neuropathy 08/23/2014  . Benign essential HTN 08/27/2006  . Hypercholesterolemia without hypertriglyceridemia 08/27/2006    Past Surgical History:  Procedure Laterality Date  . INGUINAL HERNIA REPAIR Right 11/10/2017   Procedure: LAPAROSCOPIC RIGHT  INGUINAL HERNIA REPAIR;  Surgeon: Ralene Ok, MD;  Location: [place];  Service: General;  Laterality: Right;  . INSERTION OF MESH Right 11/10/2017   Procedure: INSERTION OF MESH;  Surgeon: Ralene Ok, MD;  Location: [place];  Service: General;  Laterality: Right;  . NO PAST SURGERIES      Family History  Problem Relation Age of  Onset  . Coronary artery disease Father   . Stroke Son   . Kidney disease Neg Hx   . Prostate cancer Neg Hx   . Bladder Cancer Neg Hx     Social History   Socioeconomic History  . Marital status: Married    Spouse name: Arlina Robes  . Number of children: 3  . Years of education: Not on file  . Highest education level:  12th grade  Occupational History  . 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 Financial resource strain: Not very hard  . Food insecurity    Worry: Never true    Inability: Never true  . Transportation needs    Medical: No    Non-medical: No  Tobacco Use  . Smoking status: Never Smoker  . Smokeless tobacco: Never Used  Substance and Sexual Activity  . Alcohol use: Never    Alcohol/week: 0.0 standard drinks    Frequency: Never  . Drug use: No  . Sexual activity: Not Currently    Partners: Female  Lifestyle  . Physical activity    Days per week: 0 days    Minutes per session: 0 min  . Stress: Not at all  Relationships  . Social connections    Talks on phone: More than three times a week    Gets together: Three times a week    Attends religious service: Never    Active member of club or organization: Yes    Attends meetings of clubs or organizations: More than 4 times per year    Relationship status: Married  . Intimate partner violence    Fear of current or ex partner: No    Emotionally abused: No    Physically abused: No    Forced sexual activity: No  Other Topics Concern  . Not on file  Social History Narrative   Married for 50 plus years.    They have two children that live in [place] and one son that lives in [place].     Current Outpatient Medications:  .  atorvastatin (LIPITOR) 20 MG tablet, TAKE 1 TABLET BY MOUTH ONCE DAILY FOR CHOLESTEROL (IN PLACE OF LOVASTATIN), Disp: 90 tablet, Rfl: 1 .  Cholecalciferol (VITAMIN D) 2000 units CAPS, Take 1 capsule (2,000 Units total) by mouth daily., Disp: 30 capsule, Rfl: 0 .  dabigatran (PRADAXA) 150 MG CAPS capsule, Take 1 capsule (150 mg total) by mouth 2 (two) times daily., Disp: 60 capsule, Rfl: 5 .  galantamine (RAZADYNE) 12 MG tablet, Take 12 mg by mouth 2 (two) times daily. , Disp: , Rfl:  .  losartan (COZAAR) 50 MG tablet, Take 1 tablet (50 mg  total) by mouth daily., Disp: 90 tablet, Rfl: 1 .  tamsulosin (FLOMAX) 0.4 MG CAPS capsule, TAKE 1 CAPSULE EVERY DAY, Disp: 90 capsule, Rfl: 1 .  vitamin B-12 (CYANOCOBALAMIN) 1000 MCG tablet, Take 1,000 mcg by mouth 3 (three) times a week. , Disp: , Rfl:   No Known Allergies  I personally reviewed active problem list, medication list, allergies, family history, social history with the patient/caregiver today.   ROS  Ten systems reviewed and is negative except as mentioned in HPI   Objective  Vitals:   09/14/18 0835  BP: 138/73  Pulse: (!) 57   There is no height or weight on file to calculate BMI.  Physical Exam  Awake, alert and oriented   PHQ2/9: Depression screen [facility] 2/9  09/14/2018 05/13/2018 02/20/2018 01/12/2018 09/11/2017  Decreased Interest 0 0 0 0 0  Down, Depressed, Hopeless 0 0 0 0 0  PHQ - 2 Score 0 0 0 0 0  Altered sleeping 0 0 0 0 -  Tired, decreased energy 0 0 0 0 -  Change in appetite 0 0 0 0 -  Feeling bad or failure about yourself  0 0 0 0 -  Trouble concentrating 0 0 0 0 -  Moving slowly or fidgety/restless 0 0 0 0 -  Suicidal thoughts 0 0 0 0 -  PHQ-9 Score 0 0 0 0 -  Difficult doing work/chores Not difficult at all Not difficult at all - Not difficult at all -   PHQ-2/9 Result is negative.    Fall Risk: Fall Risk  09/14/2018 05/13/2018 02/20/2018 01/12/2018 09/11/2017  Falls in the past year? 0 0 0 No No  Number falls in past yr: 0 0 0 - -  Injury with Fall? 0 0 - - -     Assessment & Plan  1. Thrombocytosis ([place])  We will continue to monitor, telephone encounter and we will recheck next visit he comes in   2. Late onset Alzheimer's disease without behavioral disturbance (HCC)  Stable, no behavior problems  3. Atherosclerosis of aorta (HCC)  On statin and pradaxa   4. Vitamin D deficiency  On otc medication   5. Vitamin B12 deficiency  On otc supplementation   6. Benign essential HTN  Usually well controlled   7. Paroxysmal  atrial fibrillation (HCC)  Rate controlled   8. Chronic deep vein thrombosis (DVT) of right femoral vein (HCC)  Still has intermittent edema of right leg and has to wear compression stocking hoses  I discussed the assessment and treatment plan with the patient. The patient was provided an opportunity to ask questions and all were answered. The patient agreed with the plan and demonstrated an understanding of the instructions.   The patient was advised to call back or seek an in-person evaluation if the symptoms worsen or if the condition fails to improve as anticipated.  I provided 25  minutes of non-face-to-face time during this encounter.  Loistine Chance, MD

## 2018-10-01 DIAGNOSIS — F0280 Dementia in other diseases classified elsewhere without behavioral disturbance: Secondary | ICD-10-CM | POA: Diagnosis not present

## 2018-10-01 DIAGNOSIS — G301 Alzheimer's disease with late onset: Secondary | ICD-10-CM | POA: Diagnosis not present

## 2018-10-08 ENCOUNTER — Other Ambulatory Visit: Payer: Self-pay | Admitting: Family Medicine

## 2018-10-08 DIAGNOSIS — E785 Hyperlipidemia, unspecified: Secondary | ICD-10-CM

## 2018-10-20 ENCOUNTER — Telehealth: Payer: Self-pay

## 2018-10-20 NOTE — Telephone Encounter (Signed)
Copied from [place] [id number]. Topic: Referral - Question >> Oct 20, 2018  1:38 PM Berneta Levins wrote: Reason for CRM:  Pt's wife calling.  She cancelled an MRI for the pt earlier because of COVID.  Pt is ready to have that rescheduled.   I returned the call of this patient's wife and gave her the number to centralized scheduling so that she could set up a time for there husband's MRI.

## 2018-10-28 ENCOUNTER — Telehealth: Payer: Self-pay

## 2018-10-28 NOTE — Telephone Encounter (Signed)
Copied from [place] [id number]. Topic: General - Other >> Oct 28, 2018 10:39 AM Yvette Rack wrote: Reason for CRM: Melissa with [facility] stated pt has an appt on 11/01/18 for MRI and they need authorization. Cb# [phone] Ext [phone]   Criteria has been met   Valid for 30 days 11/01/18 - 12/01/2018   Auth # [id number]

## 2018-11-01 ENCOUNTER — Ambulatory Visit
Admission: RE | Admit: 2018-11-01 | Discharge: 2018-11-01 | Disposition: A | Discharge status: Home / Self Care | Payer: Medicare PPO | Source: Ambulatory Visit | Attending: Family Medicine | Admitting: Family Medicine

## 2018-11-01 DIAGNOSIS — K862 Cyst of pancreas: Secondary | ICD-10-CM | POA: Insufficient documentation

## 2018-11-01 DIAGNOSIS — N281 Cyst of kidney, acquired: Secondary | ICD-10-CM | POA: Diagnosis not present

## 2018-11-01 LAB — POCT I-STAT CREATININE: Creatinine, Ser: 1.5 mg/dL — ABNORMAL HIGH (ref 0.61–1.24)

## 2018-11-01 MED ORDER — GADOBUTROL 1 MMOL/ML IV SOLN
8.0000 mL | Freq: Once | INTRAVENOUS | Status: AC | PRN
  Administered 2018-11-01: 8 mL via INTRAVENOUS

## 2018-11-26 DIAGNOSIS — R001 Bradycardia, unspecified: Secondary | ICD-10-CM | POA: Diagnosis not present

## 2018-11-26 DIAGNOSIS — I10 Essential (primary) hypertension: Secondary | ICD-10-CM | POA: Diagnosis not present

## 2018-11-26 DIAGNOSIS — I208 Other forms of angina pectoris: Secondary | ICD-10-CM | POA: Diagnosis not present

## 2018-11-26 DIAGNOSIS — R002 Palpitations: Secondary | ICD-10-CM | POA: Diagnosis not present

## 2018-11-26 DIAGNOSIS — E785 Hyperlipidemia, unspecified: Secondary | ICD-10-CM | POA: Diagnosis not present

## 2018-11-26 DIAGNOSIS — R0602 Shortness of breath: Secondary | ICD-10-CM | POA: Diagnosis not present

## 2018-11-26 DIAGNOSIS — R011 Cardiac murmur, unspecified: Secondary | ICD-10-CM | POA: Diagnosis not present

## 2018-11-26 DIAGNOSIS — I480 Paroxysmal atrial fibrillation: Secondary | ICD-10-CM | POA: Diagnosis not present

## 2018-11-26 DIAGNOSIS — I495 Sick sinus syndrome: Secondary | ICD-10-CM | POA: Diagnosis not present

## 2018-12-07 ENCOUNTER — Other Ambulatory Visit: Payer: Self-pay

## 2018-12-07 ENCOUNTER — Ambulatory Visit (INDEPENDENT_AMBULATORY_CARE_PROVIDER_SITE_OTHER): Payer: Medicare PPO

## 2018-12-07 DIAGNOSIS — Z23 Encounter for immunization: Secondary | ICD-10-CM

## 2018-12-10 ENCOUNTER — Other Ambulatory Visit: Payer: Self-pay | Admitting: Family Medicine

## 2018-12-10 DIAGNOSIS — I10 Essential (primary) hypertension: Secondary | ICD-10-CM

## 2018-12-17 ENCOUNTER — Other Ambulatory Visit: Payer: Self-pay | Admitting: Family Medicine

## 2018-12-17 DIAGNOSIS — I10 Essential (primary) hypertension: Secondary | ICD-10-CM

## 2019-01-17 ENCOUNTER — Other Ambulatory Visit: Payer: Self-pay | Admitting: Family Medicine

## 2019-01-17 DIAGNOSIS — I480 Paroxysmal atrial fibrillation: Secondary | ICD-10-CM

## 2019-01-20 ENCOUNTER — Ambulatory Visit: Payer: Medicare PPO | Admitting: Family Medicine

## 2019-01-27 ENCOUNTER — Ambulatory Visit (INDEPENDENT_AMBULATORY_CARE_PROVIDER_SITE_OTHER): Payer: Medicare PPO | Admitting: Family Medicine

## 2019-01-27 ENCOUNTER — Other Ambulatory Visit: Payer: Self-pay

## 2019-01-27 ENCOUNTER — Encounter: Payer: Self-pay | Admitting: Family Medicine

## 2019-01-27 VITALS — BP 124/80 | HR 83 | Temp 96.9°F | Resp 16 | Ht 71.0 in | Wt 188.3 lb

## 2019-01-27 DIAGNOSIS — E7800 Pure hypercholesterolemia, unspecified: Secondary | ICD-10-CM | POA: Diagnosis not present

## 2019-01-27 DIAGNOSIS — F0280 Dementia in other diseases classified elsewhere without behavioral disturbance: Secondary | ICD-10-CM

## 2019-01-27 DIAGNOSIS — R3915 Urgency of urination: Secondary | ICD-10-CM

## 2019-01-27 DIAGNOSIS — I480 Paroxysmal atrial fibrillation: Secondary | ICD-10-CM

## 2019-01-27 DIAGNOSIS — N183 Chronic kidney disease, stage 3 unspecified: Secondary | ICD-10-CM

## 2019-01-27 DIAGNOSIS — I82511 Chronic embolism and thrombosis of right femoral vein: Secondary | ICD-10-CM | POA: Diagnosis not present

## 2019-01-27 DIAGNOSIS — I700 Atherosclerosis of aorta: Secondary | ICD-10-CM | POA: Diagnosis not present

## 2019-01-27 DIAGNOSIS — E538 Deficiency of other specified B group vitamins: Secondary | ICD-10-CM

## 2019-01-27 DIAGNOSIS — R7303 Prediabetes: Secondary | ICD-10-CM | POA: Diagnosis not present

## 2019-01-27 DIAGNOSIS — G301 Alzheimer's disease with late onset: Secondary | ICD-10-CM

## 2019-01-27 DIAGNOSIS — R739 Hyperglycemia, unspecified: Secondary | ICD-10-CM

## 2019-01-27 DIAGNOSIS — E559 Vitamin D deficiency, unspecified: Secondary | ICD-10-CM | POA: Diagnosis not present

## 2019-01-27 DIAGNOSIS — K862 Cyst of pancreas: Secondary | ICD-10-CM

## 2019-01-27 DIAGNOSIS — I10 Essential (primary) hypertension: Secondary | ICD-10-CM

## 2019-01-27 DIAGNOSIS — N401 Enlarged prostate with lower urinary tract symptoms: Secondary | ICD-10-CM

## 2019-01-27 NOTE — Progress Notes (Addendum)
Name: Ronald Fleming   MRN: [id number]    DOB: 06/20/1935   Date:01/27/2019       Progress Note  Subjective  Chief Complaint  Chief Complaint  Patient presents with  . Atrial Fibrillation  . Hypertension  . Hyperlipidemia  . Benign Prostatic Hypertrophy    HPI  HTN:not checking bp lately.   No chest pain he has occasional palpation  Hyperlipidemia:taking statins, denies side effects,last LDL at goal. He also has atherosclerosis of aorta, is on statin therapy, off aspirin, but is on pradaxa. We will recheck labs today   Alzheimer's late onset: wife noticed he had memory loss, seen by neurologist and is now on Razadyne12mg  twice dailye has been on medicationsince Fall 2017,doing well on medications.His appetite is normal. No wandering , still able to perform all ADL, takes his own medications and still pays bills. Lives with wife   Pre-diabetes: last hgbA1C was back to normal, he denies polyphagia, polydipsia or polyuria Recheck labs today  Afib: doing well, no SOB but has intermittent palpitation, on Pradaxa and denies easy bleedingor bruising.Sees Dr Clayborn Bigness, he has been off aspirin because of risk of bleeding.   BPH: he has urinary frequency during the day,he also has nocturia twice per night, he is on Flomax,states able to fall back asleep, sees Dr. Bernardo Heater. He has a history of hematuria secondary to kidney stones.   Pancreas cyst: found on CT for stone search, had repeat MRI 10/2018 and was advised to repeat in one year. He has good appetite, no nausea, vomiting or abdominal pain.   Chronic DVT right leg, also has varicose veins, no pain or discomfort at this time, he has intermittent right lower extremity edema that resolves with compression stocking hoses prn   Patient Active Problem List   Diagnosis Date Noted  . Thrombocytosis ([place]) 10/29/2017  . DDD (degenerative disc disease), lumbosacral 09/11/2017  . Nephrolithiasis 09/11/2017  . Inguinal hernia  of right side without obstruction or gangrene 09/11/2017  . Vitamin D deficiency 09/11/2017  . Lesion of pancreas 07/09/2017  . Atherosclerosis of aorta ([place]) 07/01/2017  . Osteopenia 05/21/2017  . Atrioventricular block, second degree 05/29/2016  . Vitamin B12 deficiency 05/21/2016  . Late onset Alzheimer's disease without behavioral disturbance ([place]) 05/21/2016  . Vitiligo 02/12/2016  . History of DVT of lower extremity 08/03/2015  . BPH (benign prostatic hyperplasia) 08/03/2015  . Atrial fibrillation ([place]) 02/01/2015  . Edema leg 08/23/2014  . Neuropathy 08/23/2014  . Benign essential HTN 08/27/2006  . Hypercholesterolemia without hypertriglyceridemia 08/27/2006    Past Surgical History:  Procedure Laterality Date  . INGUINAL HERNIA REPAIR Right 11/10/2017   Procedure: LAPAROSCOPIC RIGHT  INGUINAL HERNIA REPAIR;  Surgeon: Ralene Ok, MD;  Location: [facility];  Service: General;  Laterality: Right;  . INSERTION OF MESH Right 11/10/2017   Procedure: INSERTION OF MESH;  Surgeon: Ralene Ok, MD;  Location: [facility];  Service: General;  Laterality: Right;  . NO PAST SURGERIES      Family History  Problem Relation Age of Onset  . Coronary artery disease Father   . Stroke Son   . Kidney disease Neg Hx   . Prostate cancer Neg Hx   . Bladder Cancer Neg Hx     Social History   Socioeconomic History  . Marital status: Married    Spouse name: Arlina Robes  . Number of children: 3  . Years of education: Not on file  . Highest education level: 12th grade  Occupational History  .  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 Financial resource strain: Not very hard  . Food insecurity    Worry: Never true    Inability: Never true  . Transportation needs    Medical: No    Non-medical: No  Tobacco Use  . Smoking status: Never Smoker  . Smokeless tobacco: Never Used  Substance and Sexual Activity  . Alcohol  use: Never    Alcohol/week: 0.0 standard drinks    Frequency: Never  . Drug use: No  . Sexual activity: Not Currently    Partners: Female  Lifestyle  . Physical activity    Days per week: 0 days    Minutes per session: 0 min  . Stress: Not at all  Relationships  . Social connections    Talks on phone: More than three times a week    Gets together: Three times a week    Attends religious service: Never    Active member of club or organization: Yes    Attends meetings of clubs or organizations: More than 4 times per year    Relationship status: Married  . Intimate partner violence    Fear of current or ex partner: No    Emotionally abused: No    Physically abused: No    Forced sexual activity: No  Other Topics Concern  . Not on file  Social History Narrative   Married for 50 plus years.    They have two children that live in [place] and one son that lives in [place].   He likes to work in his garden      Current Outpatient Medications:  .  atorvastatin (LIPITOR) 20 MG tablet, TAKE 1 TABLET BY MOUTH ONCE DAILY FOR CHOLESTEROL (IN PLACE OF LOVASTATIN), Disp: 90 tablet, Rfl: 1 .  Cholecalciferol (VITAMIN D) 2000 units CAPS, Take 1 capsule (2,000 Units total) by mouth daily., Disp: 30 capsule, Rfl: 0 .  losartan (COZAAR) 50 MG tablet, TAKE 1 TABLET EVERY DAY, Disp: 90 tablet, Rfl: 1 .  PRADAXA 150 MG CAPS capsule, Take 1 capsule by mouth twice daily, Disp: 180 capsule, Rfl: 0 .  tamsulosin (FLOMAX) 0.4 MG CAPS capsule, TAKE 1 CAPSULE EVERY DAY, Disp: 90 capsule, Rfl: 1 .  vitamin B-12 (CYANOCOBALAMIN) 1000 MCG tablet, Take 1,000 mcg by mouth 3 (three) times a week. , Disp: , Rfl:  .  galantamine (RAZADYNE) 12 MG tablet, Take 12 mg by mouth 2 (two) times daily. , Disp: , Rfl:   No Known Allergies  I personally reviewed active problem list, medication list, allergies, family history, social history, health maintenance with the patient/caregiver today.   ROS  Constitutional:  Negative for fever or weight change.  Respiratory: Negative for cough and shortness of breath.   Cardiovascular: Negative for chest pain or palpitations.  Gastrointestinal: Negative for abdominal pain, no bowel changes.  Musculoskeletal: Negative for gait problem or joint swelling.  Skin: Negative for rash.  Neurological: Negative for dizziness or headache.  No other specific complaints in a complete review of systems (except as listed in HPI above).  Objective  Vitals:   01/27/19 1116  BP: 124/80  Pulse: 83  Resp: 16  Temp: (!) 96.9 F (36.1 C)  TempSrc: Temporal  SpO2: 95%  Weight: 188 lb 4.8 oz (85.4 kg)  Height: 5\' 11"  (1.803 m)    Body mass index is 26.26 kg/m.  Physical Exam  Constitutional: Patient appears well-developed  and well-nourished. Overweight.  No distress.  HEENT: head atraumatic, normocephalic, pupils equal and reactive to light Cardiovascular: Normal rate, irregular rhythm and normal heart sounds.  No murmur heard. No BLE edema. Pulmonary/Chest: Effort normal and breath sounds normal. No respiratory distress. Abdominal: Soft.  There is no tenderness. Psychiatric: Patient has a normal mood and affect. behavior is normal. Judgment and thought content normal.  Recent Results (from the past 2160 hour(s))  I-STAT creatinine     Status: Abnormal   Collection Time: 11/01/18 10:30 AM  Result Value Ref Range   Creatinine, Ser 1.50 (H) 0.61 - 1.24 mg/dL      PHQ2/9: Depression screen [facility] 2/9 01/27/2019 09/14/2018 05/13/2018 02/20/2018 01/12/2018  Decreased Interest 0 0 0 0 0  Down, Depressed, Hopeless 0 0 0 0 0  PHQ - 2 Score 0 0 0 0 0  Altered sleeping 0 0 0 0 0  Tired, decreased energy 0 0 0 0 0  Change in appetite 0 0 0 0 0  Feeling bad or failure about yourself  0 0 0 0 0  Trouble concentrating 0 0 0 0 0  Moving slowly or fidgety/restless 0 0 0 0 0  Suicidal thoughts 0 0 0 0 0  PHQ-9 Score 0 0 0 0 0  Difficult doing work/chores - Not difficult at  all Not difficult at all - Not difficult at all    phq 9 is negative   Fall Risk: Fall Risk  01/27/2019 09/14/2018 05/13/2018 02/20/2018 01/12/2018  Falls in the past year? 0 0 0 0 No  Number falls in past yr: 0 0 0 0 -  Injury with Fall? 0 0 0 - -    Functional Status Survey: Is the patient deaf or have difficulty hearing?: No Does the patient have difficulty seeing, even when wearing glasses/contacts?: No Does the patient have difficulty concentrating, remembering, or making decisions?: No Does the patient have difficulty walking or climbing stairs?: No Does the patient have difficulty dressing or bathing?: No Does the patient have difficulty doing errands alone such as visiting a doctor's office or shopping?: No    Assessment & Plan  1. Atherosclerosis of aorta ([facility])  Continue statin therapy   2. Late onset Alzheimer's disease without behavioral disturbance ([facility])  Sees Dr. Manuella Ghazi once a year now , stable   3. Vitamin D deficiency  Continue supplementation   4. Vitamin B12 deficiency  Continue supplementation   5. Chronic deep vein thrombosis (DVT) of right femoral vein ([facility]CC)  Stable, occasionally gets swelling and wears compression stocking hoses prn   6. Paroxysmal atrial fibrillation ([facility])  Doing well   7. Benign essential HTN  - COMPLETE METABOLIC PANEL WITH GFR - CBC with Differential/Platelet  8. Pre-diabetes   9. Hypercholesterolemia without hypertriglyceridemia  - Lipid panel  10. Benign prostatic hyperplasia (BPH) with urinary urgency  Under the care of Dr. Ashok Cordia   11. Cyst of pancreas  Repeat in 10/2019   12. Stage 3 chronic kidney disease, unspecified whether stage 3a or 3b CKD  - Microalbumin / creatinine urine ratio  13. Hyperglycemia  - Hemoglobin A1c

## 2019-01-28 LAB — LIPID PANEL
Cholesterol: 148 mg/dL (ref <200)
HDL: 63 mg/dL (ref >=40)
LDL Cholesterol (Calc): 71 mg/dL (calc)
Non-HDL Cholesterol (Calc): 85 mg/dL (calc) (ref <130)
Total CHOL/HDL Ratio: 2.3 (calc) (ref <5.0)
Triglycerides: 63 mg/dL (ref <150)

## 2019-01-28 LAB — COMPLETE METABOLIC PANEL WITH GFR
AG Ratio: 1.5 (calc) (ref 1.0–2.5)
ALT: 20 U/L (ref 9–46)
AST: 20 U/L (ref 10–35)
Albumin: 4.1 g/dL (ref 3.6–5.1)
Alkaline phosphatase (APISO): 75 U/L (ref 35–144)
BUN/Creatinine Ratio: 10 (calc) (ref 6–22)
BUN: 14 mg/dL (ref 7–25)
CO2: 30 mmol/L (ref 20–32)
Calcium: 9.2 mg/dL (ref 8.6–10.3)
Chloride: 107 mmol/L (ref 98–110)
Creat: 1.36 mg/dL — ABNORMAL HIGH (ref 0.70–1.11)
GFR, Est African American: 56 mL/min/{1.73_m2} — ABNORMAL LOW (ref >=60)
GFR, Est Non African American: 48 mL/min/{1.73_m2} — ABNORMAL LOW (ref >=60)
Globulin: 2.8 g/dL (calc) (ref 1.9–3.7)
Glucose, Bld: 93 mg/dL (ref 65–99)
Potassium: 4.1 mmol/L (ref 3.5–5.3)
Sodium: 142 mmol/L (ref 135–146)
Total Bilirubin: 0.8 mg/dL (ref 0.2–1.2)
Total Protein: 6.9 g/dL (ref 6.1–8.1)

## 2019-01-28 LAB — CBC WITH DIFFERENTIAL/PLATELET
Absolute Monocytes: 521 cells/uL (ref 200–950)
Basophils Absolute: 33 cells/uL (ref 0–200)
Basophils Relative: 0.5 %
Eosinophils Absolute: 73 cells/uL (ref 15–500)
Eosinophils Relative: 1.1 %
HCT: 43.3 % (ref 38.5–50.0)
Hemoglobin: 14.5 g/dL (ref 13.2–17.1)
Lymphs Abs: 1228 cells/uL (ref 850–3900)
MCH: 32.6 pg (ref 27.0–33.0)
MCHC: 33.5 g/dL (ref 32.0–36.0)
MCV: 97.3 fL (ref 80.0–100.0)
MPV: 11.2 fL (ref 7.5–12.5)
Monocytes Relative: 7.9 %
Neutro Abs: 4745 cells/uL (ref 1500–7800)
Neutrophils Relative %: 71.9 %
Platelets: 173 10*3/uL (ref 140–400)
RBC: 4.45 10*6/uL (ref 4.20–5.80)
RDW: 11.8 % (ref 11.0–15.0)
Total Lymphocyte: 18.6 %
WBC: 6.6 10*3/uL (ref 3.8–10.8)

## 2019-01-28 LAB — HEMOGLOBIN A1C
Hgb A1c MFr Bld: 5.7 % of total Hgb — ABNORMAL HIGH (ref <5.7)
Mean Plasma Glucose: 117 (calc)
eAG (mmol/L): 6.5 (calc)

## 2019-01-28 LAB — MICROALBUMIN / CREATININE URINE RATIO
Creatinine, Urine: 185 mg/dL (ref 20–320)
Microalb Creat Ratio: 4 mcg/mg creat (ref <30)
Microalb, Ur: 0.8 mg/dL

## 2019-02-23 ENCOUNTER — Ambulatory Visit (INDEPENDENT_AMBULATORY_CARE_PROVIDER_SITE_OTHER): Payer: Medicare PPO

## 2019-02-23 ENCOUNTER — Other Ambulatory Visit: Payer: Self-pay

## 2019-02-23 VITALS — BP 121/71 | HR 54 | Ht 71.0 in | Wt 188.0 lb

## 2019-02-23 DIAGNOSIS — Z0000 Encounter for general adult medical examination without abnormal findings: Secondary | ICD-10-CM | POA: Diagnosis not present

## 2019-02-23 NOTE — Patient Instructions (Signed)
Ronald Fleming , Thank you for taking time to come for your Medicare Wellness Visit. I appreciate your ongoing commitment to your health goals. Please review the following plan we discussed and let me know if I can assist you in the future.   Screening recommendations/referrals: Colonoscopy: no longer required Recommended yearly ophthalmology/optometry visit for glaucoma screening and checkup Recommended yearly dental visit for hygiene and checkup  Vaccinations: Influenza vaccine: done 12/07/18 Pneumococcal vaccine: done 05/25/13 Tdap vaccine: done 03/28/10 Shingles vaccine: Shingrix discussed. Please contact your pharmacy for coverage information.   Advanced directives: Please bring a copy of your health care power of attorney and living will to the office at your convenience.  Conditions/risks identified: Recommend increasing physical activity  Next appointment: Please follow up in one year for your Medicare Annual Wellness visit.    Preventive Care 81 Years and Older, Male Preventive care refers to lifestyle choices and visits with your health care provider that can promote health and wellness. What does preventive care include?  A yearly physical exam. This is also called an annual well check.  Dental exams once or twice a year.  Routine eye exams. Ask your health care provider how often you should have your eyes checked.  Personal lifestyle choices, including:  Daily care of your teeth and gums.  Regular physical activity.  Eating a healthy diet.  Avoiding tobacco and drug use.  Limiting alcohol use.  Practicing safe sex.  Taking low doses of aspirin every day.  Taking vitamin and mineral supplements as recommended by your health care provider. What happens during an annual well check? The services and screenings done by your health care provider during your annual well check will depend on your age, overall health, lifestyle risk factors, and family history of disease.  Counseling  Your health care provider may ask you questions about your:  Alcohol use.  Tobacco use.  Drug use.  Emotional well-being.  Home and relationship well-being.  Sexual activity.  Eating habits.  History of falls.  Memory and ability to understand (cognition).  Work and work Statistician. Screening  You may have the following tests or measurements:  Height, weight, and BMI.  Blood pressure.  Lipid and cholesterol levels. These may be checked every 5 years, or more frequently if you are over 17 years old.  Skin check.  Lung cancer screening. You may have this screening every year starting at age 81 if you have a 30-pack-year history of smoking and currently smoke or have quit within the past 15 years.  Fecal occult blood test (FOBT) of the stool. You may have this test every year starting at age 81.  Flexible sigmoidoscopy or colonoscopy. You may have a sigmoidoscopy every 5 years or a colonoscopy every 10 years starting at age 81.  Prostate cancer screening. Recommendations will vary depending on your family history and other risks.  Hepatitis C blood test.  Hepatitis B blood test.  Sexually transmitted disease (STD) testing.  Diabetes screening. This is done by checking your blood sugar (glucose) after you have not eaten for a while (fasting). You may have this done every 1-3 years.  Abdominal aortic aneurysm (AAA) screening. You may need this if you are a current or former smoker.  Osteoporosis. You may be screened starting at age 81 if you are at high risk. Talk with your health care provider about your test results, treatment options, and if necessary, the need for more tests. Vaccines  Your health care provider may recommend certain  vaccines, such as:  Influenza vaccine. This is recommended every year.  Tetanus, diphtheria, and acellular pertussis (Tdap, Td) vaccine. You may need a Td booster every 10 years.  Zoster vaccine. You may need this  after age 81.  Pneumococcal 13-valent conjugate (PCV13) vaccine. One dose is recommended after age 81.  Pneumococcal polysaccharide (PPSV23) vaccine. One dose is recommended after age 81. Talk to your health care provider about which screenings and vaccines you need and how often you need them. This information is not intended to replace advice given to you by your health care provider. Make sure you discuss any questions you have with your health care provider. Document Released: 03/31/2015 Document Revised: 11/22/2015 Document Reviewed: 01/03/2015 Elsevier Interactive Patient Education  2017 [place] Prevention in the Home Falls can cause injuries. They can happen to people of all ages. There are many things you can do to make your home safe and to help prevent falls. What can I do on the outside of my home?  Regularly fix the edges of walkways and driveways and fix any cracks.  Remove anything that might make you trip as you walk through a door, such as a raised step or threshold.  Trim any bushes or trees on the path to your home.  Use bright outdoor lighting.  Clear any walking paths of anything that might make someone trip, such as rocks or tools.  Regularly check to see if handrails are loose or broken. Make sure that both sides of any steps have handrails.  Any raised decks and porches should have guardrails on the edges.  Have any leaves, snow, or ice cleared regularly.  Use sand or salt on walking paths during winter.  Clean up any spills in your garage right away. This includes oil or grease spills. What can I do in the bathroom?  Use night lights.  Install grab bars by the toilet and in the tub and shower. Do not use towel bars as grab bars.  Use non-skid mats or decals in the tub or shower.  If you need to sit down in the shower, use a plastic, non-slip stool.  Keep the floor dry. Clean up any water that spills on the floor as soon as it happens.   Remove soap buildup in the tub or shower regularly.  Attach bath mats securely with double-sided non-slip rug tape.  Do not have throw rugs and other things on the floor that can make you trip. What can I do in the bedroom?  Use night lights.  Make sure that you have a light by your bed that is easy to reach.  Do not use any sheets or blankets that are too big for your bed. They should not hang down onto the floor.  Have a firm chair that has side arms. You can use this for support while you get dressed.  Do not have throw rugs and other things on the floor that can make you trip. What can I do in the kitchen?  Clean up any spills right away.  Avoid walking on wet floors.  Keep items that you use a lot in easy-to-reach places.  If you need to reach something above you, use a strong step stool that has a grab bar.  Keep electrical cords out of the way.  Do not use floor polish or wax that makes floors slippery. If you must use wax, use non-skid floor wax.  Do not have throw rugs and other things  on the floor that can make you trip. What can I do with my stairs?  Do not leave any items on the stairs.  Make sure that there are handrails on both sides of the stairs and use them. Fix handrails that are broken or loose. Make sure that handrails are as long as the stairways.  Check any carpeting to make sure that it is firmly attached to the stairs. Fix any carpet that is loose or worn.  Avoid having throw rugs at the top or bottom of the stairs. If you do have throw rugs, attach them to the floor with carpet tape.  Make sure that you have a light switch at the top of the stairs and the bottom of the stairs. If you do not have them, ask someone to add them for you. What else can I do to help prevent falls?  Wear shoes that:  Do not have high heels.  Have rubber bottoms.  Are comfortable and fit you well.  Are closed at the toe. Do not wear sandals.  If you use a  stepladder:  Make sure that it is fully opened. Do not climb a closed stepladder.  Make sure that both sides of the stepladder are locked into place.  Ask someone to hold it for you, if possible.  Clearly mark and make sure that you can see:  Any grab bars or handrails.  First and last steps.  Where the edge of each step is.  Use tools that help you move around (mobility aids) if they are needed. These include:  Canes.  Walkers.  Scooters.  Crutches.  Turn on the lights when you go into a dark area. Replace any light bulbs as soon as they burn out.  Set up your furniture so you have a clear path. Avoid moving your furniture around.  If any of your floors are uneven, fix them.  If there are any pets around you, be aware of where they are.  Review your medicines with your doctor. Some medicines can make you feel dizzy. This can increase your chance of falling. Ask your doctor what other things that you can do to help prevent falls. This information is not intended to replace advice given to you by your health care provider. Make sure you discuss any questions you have with your health care provider. Document Released: 12/29/2008 Document Revised: 08/10/2015 Document Reviewed: 04/08/2014 Elsevier Interactive Patient Education  2017 [employer].

## 2019-02-23 NOTE — Progress Notes (Signed)
Subjective:   Ronald Fleming is a 83 y.o. male who presents for Medicare Annual/Subsequent preventive examination.  Virtual Visit via Telephone Note  I connected with [name] on 02/23/19 at 10:00 AM EST by telephone and verified that I am speaking with the correct person using two identifiers.  Medicare Annual Wellness visit completed telephonically due to Covid-19 pandemic.   Location: Patient: home Provider: office   I discussed the limitations, risks, security and privacy concerns of performing an evaluation and management service by telephone and the availability of in person appointments. The patient expressed understanding and agreed to proceed.  Some vital signs may be absent or patient reported.   Clemetine Marker, LPN    Review of Systems:   Cardiac Risk Factors include: advanced age (>23men, >74 women);male gender;dyslipidemia;hypertension     Objective:    Vitals: BP 121/71   Pulse (!) 54   Ht 5\' 11"  (1.803 m)   Wt 188 lb (85.3 kg)   BMI 26.22 kg/m   Body mass index is 26.22 kg/m.  Advanced Directives 02/23/2019 02/20/2018 10/28/2017 12/09/2016 09/10/2016 05/29/2016 02/12/2016  Does Patient Have a Medical Advance Directive? Yes Yes Yes Yes Yes Yes Yes  Type of Paramedic of [place];Living will [place];Living will [place];Living will [place];Living will [place];Living will Living will [place];Living will  Does patient want to make changes to medical advance directive? - - No - Patient declined - - - -  Copy of [place] in Chart? No - copy requested No - copy requested No - copy requested No - copy requested - No - copy requested -    Tobacco Social History   Tobacco Use  Smoking Status Never Smoker  Smokeless Tobacco Never Used     Counseling given: Not Answered   Clinical Intake:  Pre-visit preparation  completed: Yes  Pain : No/denies pain     BMI - recorded: 26.22 Nutritional Status: BMI 25 -29 Overweight Nutritional Risks: None Diabetes: No  How often do you need to have someone help you when you read instructions, pamphlets, or other written materials from your doctor or pharmacy?: 1 - Never  Interpreter Needed?: No  Information entered by :: Clemetine Marker LPN  Past Medical History:  Diagnosis Date  . Arrhythmia   . Arthritis   . Benign positional vertigo   . BPH (benign prostatic hyperplasia)   . Deep vein blood clot of right lower extremity ([place])   . Dementia ([place])   . DVT (deep venous thrombosis) ([place])   . Dysrhythmia    ApFib  . Glaucoma   . Hyperlipidemia   . Hypertension   . Neuropathy    Past Surgical History:  Procedure Laterality Date  . INGUINAL HERNIA REPAIR Right 11/10/2017   Procedure: LAPAROSCOPIC RIGHT  INGUINAL HERNIA REPAIR;  Surgeon: Ralene Ok, MD;  Location: [facility];  Service: General;  Laterality: Right;  . INSERTION OF MESH Right 11/10/2017   Procedure: INSERTION OF MESH;  Surgeon: Ralene Ok, MD;  Location: [facility];  Service: General;  Laterality: Right;  . NO PAST SURGERIES     Family History  Problem Relation Age of Onset  . Coronary artery disease Father   . Stroke Son   . Kidney disease Neg Hx   . Prostate cancer Neg Hx   . Bladder Cancer Neg Hx    Social History   Socioeconomic History  .  Marital status: Married    Spouse name: Arlina Robes  . Number of children: 3  . Years of education: Not on file  . Highest education level: 12th grade  Occupational History  . 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 Financial resource strain: Not very hard  . Food insecurity    Worry: Never true    Inability: Never true  . Transportation needs    Medical: No    Non-medical: No  Tobacco Use  . Smoking status: Never Smoker  . Smokeless tobacco: Never Used   Substance and Sexual Activity  . Alcohol use: Never    Alcohol/week: 0.0 standard drinks    Frequency: Never  . Drug use: No  . Sexual activity: Not Currently    Partners: Female  Lifestyle  . Physical activity    Days per week: 0 days    Minutes per session: 0 min  . Stress: Not at all  Relationships  . Social connections    Talks on phone: More than three times a week    Gets together: Three times a week    Attends religious service: Never    Active member of club or organization: Yes    Attends meetings of clubs or organizations: More than 4 times per year    Relationship status: Married  Other Topics Concern  . Not on file  Social History Narrative   Married for 50 plus years.    They have two children that live in [place] and one son that lives in [place].   He likes to work in his garden     Outpatient Encounter Medications as of 02/23/2019  Medication Sig  . atorvastatin (LIPITOR) 20 MG tablet TAKE 1 TABLET BY MOUTH ONCE DAILY FOR CHOLESTEROL (IN PLACE OF LOVASTATIN)  . Cholecalciferol (VITAMIN D) 2000 units CAPS Take 1 capsule (2,000 Units total) by mouth daily.  Marland Kitchen losartan (COZAAR) 50 MG tablet TAKE 1 TABLET EVERY DAY  . PRADAXA 150 MG CAPS capsule Take 1 capsule by mouth twice daily  . tamsulosin (FLOMAX) 0.4 MG CAPS capsule TAKE 1 CAPSULE EVERY DAY  . vitamin B-12 (CYANOCOBALAMIN) 1000 MCG tablet Take 1,000 mcg by mouth 3 (three) times a week.   . galantamine (RAZADYNE) 12 MG tablet Take 12 mg by mouth 2 (two) times daily.    No facility-administered encounter medications on file as of 02/23/2019.     Activities of Daily Living In your present state of health, do you have any difficulty performing the following activities: 02/23/2019 01/27/2019  Hearing? N N  Comment declines hearing aids -  Vision? N N  Difficulty concentrating or making decisions? N N  Walking or climbing stairs? N N  Dressing or bathing? N N  Doing errands, shopping? N N  Preparing Food and  eating ? N -  Using the Toilet? N -  In the past six months, have you accidently leaked urine? N -  Do you have problems with loss of bowel control? N -  Managing your Medications? N -  Managing your Finances? N -  Housekeeping or managing your Housekeeping? N -  Some recent data might be hidden    Patient Care Team: Steele Sizer, MD as PCP - General (Family Medicine) Yolonda Kida, MD as Consulting Physician (Cardiology) Vladimir Crofts, MD as Consulting Physician (Neurology) Ardis Hughs, MD as Attending Physician (Urology)   Assessment:   This is  a routine wellness examination for [place].  Exercise Activities and Dietary recommendations Current Exercise Habits: The patient does not participate in regular exercise at present, Exercise limited by: None identified  Goals    . Increase physical activity     Increase physical activity to 150 minutes per week       Fall Risk Fall Risk  02/23/2019 01/27/2019 09/14/2018 05/13/2018 02/20/2018  Falls in the past year? 0 0 0 0 0  Number falls in past yr: 0 0 0 0 0  Injury with Fall? 0 0 0 0 -  Follow up Falls prevention discussed - - - -   FALL RISK PREVENTION PERTAINING TO THE HOME:  Any stairs in or around the home? Yes  If so, do they handrails? Yes   Home free of loose throw rugs in walkways, pet beds, electrical cords, etc? Yes  Adequate lighting in your home to reduce risk of falls? Yes   ASSISTIVE DEVICES UTILIZED TO PREVENT FALLS:  Life alert? No  Use of a cane, walker or w/c? No  Grab bars in the bathroom? No  Shower chair or bench in shower? No  Elevated toilet seat or a handicapped toilet? No   DME ORDERS:  DME order needed?  No   TIMED UP AND GO:  Was the test performed? No . Telephonic visit.   Education: Fall risk prevention has been discussed.  Intervention(s) required? No   Depression Screen PHQ 2/9 Scores 02/23/2019 01/27/2019 09/14/2018 05/13/2018  PHQ - 2 Score 0 0 0 0  PHQ- 9 Score -  0 0 0    Cognitive Function     6CIT Screen 02/23/2019 02/20/2018  What Year? 0 points 0 points  What month? 0 points 0 points  What time? 0 points 0 points  Count back from 20 0 points 0 points  Months in reverse 0 points 0 points  Repeat phrase 6 points 0 points  Total Score 6 0    Immunization History  Administered Date(s) Administered  . Fluad Quad(high Dose 65+) 12/07/2018  . Influenza Split 03/25/2008, 12/19/2008, 11/29/2009  . Influenza, High Dose Seasonal PF 04/17/2015, 12/29/2015, 12/09/2016, 12/31/2017  . Influenza, Seasonal, Injecte, Preservative Fre 12/18/2010, 12/06/2011  . Pneumococcal Conjugate-13 05/25/2013  . Pneumococcal Polysaccharide-23 11/29/2009  . Tdap 03/28/2010  . Zoster 11/09/2010     Qualifies for Shingles Vaccine? Yes  Zostavax completed 2012. Due for Shingrix. Education has been provided regarding the importance of this vaccine. Pt has been advised to call insurance company to determine out of pocket expense. Advised may also receive vaccine at local pharmacy or Health Dept. Verbalized acceptance and understanding.  Tdap: Up to date  Flu Vaccine: Up to date  Pneumococcal Vaccine: Up to date   Screening Tests Health Maintenance  Topic Date Due  . TETANUS/TDAP  03/28/2020  . INFLUENZA VACCINE  Completed  . PNA vac Low Risk Adult  Completed   Cancer Screenings:  Colorectal Screening:  No longer required.    Lung Cancer Screening: (Low Dose CT Chest recommended if Age 55-80 years, 30 pack-year currently smoking OR have quit w/in 15years.) does not qualify.    Additional Screening:  Hepatitis C Screening: no longer required  Vision Screening: Recommended annual ophthalmology exams for early detection of glaucoma and other disorders of the eye. Is the patient up to date with their annual eye exam?  No  postponed due to Covid Who is the provider or what is the name of the office in which the  pt attends annual eye exams? [place] Screening: Recommended annual dental exams for proper oral hygiene  Community Resource Referral:  CRR required this visit?  No       Plan:    I have personally reviewed and addressed the Medicare Annual Wellness questionnaire and have noted the following in the patient's chart:  A. Medical and social history B. Use of alcohol, tobacco or illicit drugs  C. Current medications and supplements D. Functional ability and status E.  Nutritional status F.  Physical activity G. Advance directives H. List of other physicians I.  Hospitalizations, surgeries, and ER visits in previous 12 months J.  [place] such as hearing and vision if needed, cognitive and depression L. Referrals and appointments   In addition, I have reviewed and discussed with patient certain preventive protocols, quality metrics, and best practice recommendations. A written personalized care plan for preventive services as well as general preventive health recommendations were provided to patient.   Signed,  Clemetine Marker, LPN Nurse Health Advisor   Nurse Notes: none

## 2019-03-13 ENCOUNTER — Other Ambulatory Visit: Payer: Self-pay | Admitting: Family Medicine

## 2019-04-18 ENCOUNTER — Other Ambulatory Visit: Payer: Self-pay | Admitting: Family Medicine

## 2019-04-18 DIAGNOSIS — I480 Paroxysmal atrial fibrillation: Secondary | ICD-10-CM

## 2019-05-12 ENCOUNTER — Other Ambulatory Visit: Payer: Self-pay | Admitting: Family Medicine

## 2019-05-12 DIAGNOSIS — I10 Essential (primary) hypertension: Secondary | ICD-10-CM

## 2019-05-26 DIAGNOSIS — I208 Other forms of angina pectoris: Secondary | ICD-10-CM | POA: Diagnosis not present

## 2019-05-26 DIAGNOSIS — R0602 Shortness of breath: Secondary | ICD-10-CM | POA: Diagnosis not present

## 2019-05-26 DIAGNOSIS — R001 Bradycardia, unspecified: Secondary | ICD-10-CM | POA: Diagnosis not present

## 2019-05-26 DIAGNOSIS — R002 Palpitations: Secondary | ICD-10-CM | POA: Diagnosis not present

## 2019-05-26 DIAGNOSIS — R011 Cardiac murmur, unspecified: Secondary | ICD-10-CM | POA: Diagnosis not present

## 2019-05-26 DIAGNOSIS — I10 Essential (primary) hypertension: Secondary | ICD-10-CM | POA: Diagnosis not present

## 2019-05-26 DIAGNOSIS — I480 Paroxysmal atrial fibrillation: Secondary | ICD-10-CM | POA: Diagnosis not present

## 2019-05-26 DIAGNOSIS — I495 Sick sinus syndrome: Secondary | ICD-10-CM | POA: Diagnosis not present

## 2019-05-26 DIAGNOSIS — E785 Hyperlipidemia, unspecified: Secondary | ICD-10-CM | POA: Diagnosis not present

## 2019-05-28 ENCOUNTER — Encounter: Payer: Self-pay | Admitting: Family Medicine

## 2019-05-28 ENCOUNTER — Ambulatory Visit: Payer: Medicare PPO | Admitting: Family Medicine

## 2019-05-28 ENCOUNTER — Other Ambulatory Visit: Payer: Self-pay

## 2019-05-28 VITALS — BP 120/72 | HR 66 | Temp 97.4°F | Resp 16 | Ht 71.0 in | Wt 189.8 lb

## 2019-05-28 DIAGNOSIS — I82511 Chronic embolism and thrombosis of right femoral vein: Secondary | ICD-10-CM | POA: Diagnosis not present

## 2019-05-28 DIAGNOSIS — I480 Paroxysmal atrial fibrillation: Secondary | ICD-10-CM | POA: Diagnosis not present

## 2019-05-28 DIAGNOSIS — G301 Alzheimer's disease with late onset: Secondary | ICD-10-CM | POA: Diagnosis not present

## 2019-05-28 DIAGNOSIS — I700 Atherosclerosis of aorta: Secondary | ICD-10-CM

## 2019-05-28 DIAGNOSIS — I10 Essential (primary) hypertension: Secondary | ICD-10-CM | POA: Diagnosis not present

## 2019-05-28 DIAGNOSIS — E7800 Pure hypercholesterolemia, unspecified: Secondary | ICD-10-CM

## 2019-05-28 DIAGNOSIS — F0280 Dementia in other diseases classified elsewhere without behavioral disturbance: Secondary | ICD-10-CM

## 2019-05-28 DIAGNOSIS — E538 Deficiency of other specified B group vitamins: Secondary | ICD-10-CM

## 2019-05-28 DIAGNOSIS — R7303 Prediabetes: Secondary | ICD-10-CM | POA: Diagnosis not present

## 2019-05-28 DIAGNOSIS — R739 Hyperglycemia, unspecified: Secondary | ICD-10-CM | POA: Diagnosis not present

## 2019-05-28 DIAGNOSIS — Z86718 Personal history of other venous thrombosis and embolism: Secondary | ICD-10-CM

## 2019-05-28 DIAGNOSIS — N182 Chronic kidney disease, stage 2 (mild): Secondary | ICD-10-CM

## 2019-05-28 DIAGNOSIS — K4090 Unilateral inguinal hernia, without obstruction or gangrene, not specified as recurrent: Secondary | ICD-10-CM

## 2019-05-28 MED ORDER — ATORVASTATIN CALCIUM 20 MG PO TABS: 20.0000 mg | ORAL_TABLET | Freq: Every day | ORAL | 1 refills | Status: DC

## 2019-05-28 MED ORDER — DABIGATRAN ETEXILATE MESYLATE 150 MG PO CAPS: 150.0000 mg | ORAL_CAPSULE | Freq: Two times a day (BID) | ORAL | 1 refills | Status: DC

## 2019-05-28 NOTE — Progress Notes (Signed)
Name: Ronald Fleming   MRN: [id number]    DOB: Dec 03, 1935   Date:05/28/2019       Progress Note  Subjective  Chief Complaint  Chief Complaint  Patient presents with  . Medication Refill    4 month F/U  . Atrial Fibrillation  . Hyperlipidemia  . Alzheimer's late onset  . Benign Prostatic Hypertrophy    HPI  HTN:not checking bp lately. No chest pain he has occasional palpation. No dizziness   Hyperlipidemia:taking statins, denies side effects,last LDL at goal. He also has atherosclerosis of aorta, is on statin therapy, off aspirin, but takes pradaxa. Reviewed last labs with patient today   Alzheimer's late onset: wife noticed he had memory loss, seeing Dr Manuella Ghazi ( Neurologist ) and is now on Razadyne12mg  twice dailye has been on medicationsince Fall 2017,doing well on medications.His appetite is normal. No wandering , still able to perform all ADL, takes his own medications and still pays bills, driving, he has a good mood . Lives with his wife. His wife has noticed that he is doing better lately  Pre-diabetes: last hgbA1C was 5.7%  he denies polyphagia, polydipsia or polyuria.   Afib: doing well, no SOB but has intermittent palpitation, on Pradaxa and denies easy bleedingor bruising.Sees Dr Clayborn Bigness, he has been off aspirin because of risk of bleeding. He was recently seen by Dr. Clayborn Bigness and no changes in therapy   BPH: he has urinary frequency during the day,he also has nocturia twice per nightbut able to fall back asleep ( discussed night light to prevent falls) , he is on Flomax and sees Dr. Bernardo Heater. He has a history of hematuria secondary to kidney stones.   Pancreas cyst: found on CT for stone search, had repeat MRI 10/2018 and was advised to repeat in one year. He has good appetite, no nausea, vomiting or abdominal pain. Wife has noticed some growling sounds from his stomach, he denies any problems, discussed gas producing food and try to cut down, but for now  gave reassurance   Chronic DVT right leg, also has varicose veins, no pain or discomfort at this time, he has intermittent right lower extremity edema that resolves with compression stocking hoses prn Unchanged  Left inguinal hernia: he has a history of repair on the right side, he has noticed bulging on the left side for months now, no pain , redness, he wants to hold off on seeing surgeon at this time  Patient Active Problem List   Diagnosis Date Noted  . Thrombocytosis ([place]) 10/29/2017  . DDD (degenerative disc disease), lumbosacral 09/11/2017  . Nephrolithiasis 09/11/2017  . Inguinal hernia of right side without obstruction or gangrene 09/11/2017  . Vitamin D deficiency 09/11/2017  . Lesion of pancreas 07/09/2017  . Atherosclerosis of aorta ([place]) 07/01/2017  . Osteopenia 05/21/2017  . Atrioventricular block, second degree 05/29/2016  . Vitamin B12 deficiency 05/21/2016  . Late onset Alzheimer's disease without behavioral disturbance ([place]) 05/21/2016  . Vitiligo 02/12/2016  . History of DVT of lower extremity 08/03/2015  . BPH (benign prostatic hyperplasia) 08/03/2015  . Atrial fibrillation ([place]) 02/01/2015  . Edema leg 08/23/2014  . Neuropathy 08/23/2014  . Benign essential HTN 08/27/2006  . Hypercholesterolemia without hypertriglyceridemia 08/27/2006    Past Surgical History:  Procedure Laterality Date  . INGUINAL HERNIA REPAIR Right 11/10/2017   Procedure: LAPAROSCOPIC RIGHT  INGUINAL HERNIA REPAIR;  Surgeon: Ralene Ok, MD;  Location: [facility];  Service: General;  Laterality: Right;  . INSERTION OF MESH Right  11/10/2017   Procedure: INSERTION OF MESH;  Surgeon: Ralene Ok, MD;  Location: [facility];  Service: General;  Laterality: Right;  . NO PAST SURGERIES      Family History  Problem Relation Age of Onset  . Coronary artery disease Father   . Stroke Son   . Kidney disease Neg Hx   . Prostate cancer Neg Hx   . Bladder Cancer Neg Hx     Social History    Tobacco Use  . Smoking status: Never Smoker  . Smokeless tobacco: Never Used  Substance Use Topics  . Alcohol use: Never    Alcohol/week: 0.0 standard drinks     Current Outpatient Medications:  .  atorvastatin (LIPITOR) 20 MG tablet, TAKE 1 TABLET BY MOUTH ONCE DAILY FOR CHOLESTEROL (IN PLACE OF LOVASTATIN), Disp: 90 tablet, Rfl: 1 .  Cholecalciferol (VITAMIN D) 2000 units CAPS, Take 1 capsule (2,000 Units total) by mouth daily., Disp: 30 capsule, Rfl: 0 .  galantamine (RAZADYNE) 12 MG tablet, Take 12 mg by mouth 2 (two) times daily. , Disp: , Rfl:  .  losartan (COZAAR) 50 MG tablet, TAKE 1 TABLET EVERY DAY, Disp: 90 tablet, Rfl: 1 .  PRADAXA 150 MG CAPS capsule, Take 1 capsule by mouth twice daily, Disp: 180 capsule, Rfl: 0 .  tamsulosin (FLOMAX) 0.4 MG CAPS capsule, TAKE 1 CAPSULE EVERY DAY, Disp: 90 capsule, Rfl: 1 .  vitamin B-12 (CYANOCOBALAMIN) 1000 MCG tablet, Take 1,000 mcg by mouth 3 (three) times a week. , Disp: , Rfl:   No Known Allergies  I personally reviewed active problem list, medication list, allergies, family history, social history, health maintenance with the patient/caregiver today.   ROS  Constitutional: Negative for fever or weight change.  Respiratory: Negative for cough and shortness of breath.   Cardiovascular: Negative for chest pain or palpitations.  Gastrointestinal: Negative for abdominal pain, no bowel changes.  Musculoskeletal: Negative for gait problem or joint swelling.  Skin: Negative for rash.  Neurological: Negative for dizziness or headache.  No other specific complaints in a complete review of systems (except as listed in HPI above).  Objective  Vitals:   05/28/19 1013  BP: 120/72  Pulse: 66  Resp: 16  Temp: (!) 97.4 F (36.3 C)  TempSrc: Temporal  SpO2: 94%  Weight: 189 lb 12.8 oz (86.1 kg)  Height: 5\' 11"  (1.803 m)    Body mass index is 26.47 kg/m.  Physical Exam  Constitutional: Patient appears well-developed and  well-nourished. Overweight.  No distress.  HEENT: head atraumatic, normocephalic, pupils equal and reactive to light Cardiovascular: Normal rate, regular rhythm and normal heart sounds.  No murmur heard. No BLE edema. Pulmonary/Chest: Effort normal and breath sounds normal. No respiratory distress. Abdominal: Soft.  There is no tenderness. Psychiatric: Patient has a normal mood and affect. behavior is normal. Judgment and thought content normal.  PHQ2/9: Depression screen [facility] 2/9 05/28/2019 02/23/2019 01/27/2019 09/14/2018 05/13/2018  Decreased Interest 0 0 0 0 0  Down, Depressed, Hopeless 0 0 0 0 0  PHQ - 2 Score 0 0 0 0 0  Altered sleeping 0 - 0 0 0  Tired, decreased energy 0 - 0 0 0  Change in appetite 0 - 0 0 0  Feeling bad or failure about yourself  0 - 0 0 0  Trouble concentrating 0 - 0 0 0  Moving slowly or fidgety/restless 0 - 0 0 0  Suicidal thoughts 0 - 0 0 0  PHQ-9 Score 0 -  0 0 0  Difficult doing work/chores Not difficult at all - - Not difficult at all Not difficult at all  Some recent data might be hidden    phq 9 is negative  Fall Risk: Fall Risk  05/28/2019 02/23/2019 01/27/2019 09/14/2018 05/13/2018  Falls in the past year? 0 0 0 0 0  Number falls in past yr: 0 0 0 0 0  Injury with Fall? 0 0 0 0 0  Follow up - Falls prevention discussed - - -     Functional Status Survey: Is the patient deaf or have difficulty hearing?: No Does the patient have difficulty seeing, even when wearing glasses/contacts?: No Does the patient have difficulty concentrating, remembering, or making decisions?: Yes Does the patient have difficulty walking or climbing stairs?: No Does the patient have difficulty dressing or bathing?: No Does the patient have difficulty doing errands alone such as visiting a doctor's office or shopping?: No    Assessment & Plan  1. Paroxysmal atrial fibrillation (HCC)  - dabigatran (PRADAXA) 150 MG CAPS capsule; Take 1 capsule (150 mg total) by mouth 2  (two) times daily.  Dispense: 180 capsule; Refill: 1  2. Atherosclerosis of aorta (HCC)  On statin and pradaxa   3. Late onset Alzheimer's disease without behavioral disturbance ([place])  Keep follow up with neurologist   4. Chronic deep vein thrombosis (DVT) of right femoral vein (HCC)  Stable   5. Vitamin B12 deficiency   6. Benign essential HTN  At goal   7. Pre-diabetes  Discussed healthy diet    8. Hyperglycemia   9. History of DVT of lower extremity   10. Chronic kidney disease, stage 2 (mild)  Recheck it yearly   11. Hypercholesterolemia without hypertriglyceridemia  - atorvastatin (LIPITOR) 20 MG tablet; Take 1 tablet (20 mg total) by mouth daily.  Dispense: 90 tablet; Refill: 1  12. Left inguinal hernia  Right side was done in 08/2017, has a large hernia on left side now but wants to hold off because of COVID

## 2019-05-31 ENCOUNTER — Other Ambulatory Visit: Payer: Self-pay | Admitting: Family Medicine

## 2019-05-31 DIAGNOSIS — E7800 Pure hypercholesterolemia, unspecified: Secondary | ICD-10-CM

## 2019-05-31 MED ORDER — ATORVASTATIN CALCIUM 20 MG PO TABS: 20.0000 mg | ORAL_TABLET | Freq: Every day | ORAL | 0 refills | Status: DC

## 2019-05-31 NOTE — Telephone Encounter (Signed)
Medication: atorvastatin (LIPITOR) 20 MG tablet [id number] - [employer] is calling for a short term dosage to be sent to the local pharmacy. While the medication is in transit from the mail order  Has the patient contacted their pharmacy? Yes  (Agent: If no, request that the patient contact the pharmacy for the refill.) (Agent: If yes, when and what did the pharmacy advise?)  Preferred Pharmacy (with phone number or street name): [facility] [place]  Phone:  [phone] Fax:  [phone]     Agent: Please be advised that RX refills may take up to 3 business days. We ask that you follow-up with your pharmacy.

## 2019-05-31 NOTE — Telephone Encounter (Signed)
Bridge dose per request of [employer] to allow mail order to arrive

## 2019-06-02 DIAGNOSIS — E538 Deficiency of other specified B group vitamins: Secondary | ICD-10-CM | POA: Diagnosis not present

## 2019-06-02 DIAGNOSIS — G301 Alzheimer's disease with late onset: Secondary | ICD-10-CM | POA: Diagnosis not present

## 2019-06-02 DIAGNOSIS — I4891 Unspecified atrial fibrillation: Secondary | ICD-10-CM | POA: Diagnosis not present

## 2019-06-02 DIAGNOSIS — F0280 Dementia in other diseases classified elsewhere without behavioral disturbance: Secondary | ICD-10-CM | POA: Diagnosis not present

## 2019-08-18 ENCOUNTER — Other Ambulatory Visit: Payer: Self-pay | Admitting: Family Medicine

## 2019-08-18 NOTE — Telephone Encounter (Signed)
Medication Refill - Medication: tamsulosin (FLOMAX) 0.4 MG CAPS capsule    Has the patient contacted their pharmacy? Yes.   (Agent: If no, request that the patient contact the pharmacy for the refill.) (Agent: If yes, when and what did the pharmacy advise?)  Preferred Pharmacy (with phone number or street name):  [facility], [place]  [place] [address]  Phone: [phone] Fax: [phone]     Agent: Please be advised that RX refills may take up to 3 business days. We ask that you follow-up with your pharmacy.

## 2019-08-19 ENCOUNTER — Other Ambulatory Visit: Payer: Self-pay

## 2019-08-19 DIAGNOSIS — E7800 Pure hypercholesterolemia, unspecified: Secondary | ICD-10-CM

## 2019-08-19 MED ORDER — ATORVASTATIN CALCIUM 20 MG PO TABS: 20.0000 mg | ORAL_TABLET | Freq: Every day | ORAL | 0 refills | Status: DC

## 2019-08-19 MED ORDER — TAMSULOSIN HCL 0.4 MG PO CAPS: 0.4000 mg | ORAL_CAPSULE | Freq: Every day | ORAL | 0 refills | Status: DC

## 2019-08-19 MED ORDER — TAMSULOSIN HCL 0.4 MG PO CAPS: 0.4000 mg | ORAL_CAPSULE | Freq: Every day | ORAL | 1 refills | Status: DC

## 2019-08-23 ENCOUNTER — Other Ambulatory Visit: Payer: Self-pay

## 2019-08-23 DIAGNOSIS — E7800 Pure hypercholesterolemia, unspecified: Secondary | ICD-10-CM

## 2019-08-23 MED ORDER — TAMSULOSIN HCL 0.4 MG PO CAPS: 0.4000 mg | ORAL_CAPSULE | Freq: Every day | ORAL | 1 refills | Status: DC

## 2019-08-23 MED ORDER — ATORVASTATIN CALCIUM 20 MG PO TABS: 20.0000 mg | ORAL_TABLET | Freq: Every day | ORAL | 1 refills | Status: DC

## 2019-08-23 NOTE — Telephone Encounter (Signed)
Sent previously to the wrong pharmacy.

## 2019-08-25 ENCOUNTER — Other Ambulatory Visit: Payer: Self-pay | Admitting: Family Medicine

## 2019-09-30 ENCOUNTER — Other Ambulatory Visit: Payer: Self-pay | Admitting: Family Medicine

## 2019-09-30 DIAGNOSIS — I10 Essential (primary) hypertension: Secondary | ICD-10-CM

## 2019-10-19 ENCOUNTER — Ambulatory Visit: Payer: Medicare PPO | Admitting: Family Medicine

## 2019-10-19 ENCOUNTER — Other Ambulatory Visit: Payer: Self-pay

## 2019-10-19 ENCOUNTER — Encounter: Payer: Self-pay | Admitting: Family Medicine

## 2019-10-19 ENCOUNTER — Telehealth: Payer: Self-pay

## 2019-10-19 VITALS — BP 120/90 | HR 66 | Temp 97.3°F | Resp 16 | Ht 71.0 in | Wt 192.9 lb

## 2019-10-19 DIAGNOSIS — E538 Deficiency of other specified B group vitamins: Secondary | ICD-10-CM

## 2019-10-19 DIAGNOSIS — R7303 Prediabetes: Secondary | ICD-10-CM | POA: Diagnosis not present

## 2019-10-19 DIAGNOSIS — I10 Essential (primary) hypertension: Secondary | ICD-10-CM | POA: Diagnosis not present

## 2019-10-19 DIAGNOSIS — E7800 Pure hypercholesterolemia, unspecified: Secondary | ICD-10-CM | POA: Diagnosis not present

## 2019-10-19 DIAGNOSIS — N182 Chronic kidney disease, stage 2 (mild): Secondary | ICD-10-CM

## 2019-10-19 DIAGNOSIS — K4090 Unilateral inguinal hernia, without obstruction or gangrene, not specified as recurrent: Secondary | ICD-10-CM

## 2019-10-19 DIAGNOSIS — I480 Paroxysmal atrial fibrillation: Secondary | ICD-10-CM

## 2019-10-19 DIAGNOSIS — R739 Hyperglycemia, unspecified: Secondary | ICD-10-CM | POA: Diagnosis not present

## 2019-10-19 DIAGNOSIS — K862 Cyst of pancreas: Secondary | ICD-10-CM

## 2019-10-19 DIAGNOSIS — F0280 Dementia in other diseases classified elsewhere without behavioral disturbance: Secondary | ICD-10-CM | POA: Diagnosis not present

## 2019-10-19 DIAGNOSIS — N401 Enlarged prostate with lower urinary tract symptoms: Secondary | ICD-10-CM

## 2019-10-19 DIAGNOSIS — E559 Vitamin D deficiency, unspecified: Secondary | ICD-10-CM

## 2019-10-19 DIAGNOSIS — I82511 Chronic embolism and thrombosis of right femoral vein: Secondary | ICD-10-CM

## 2019-10-19 DIAGNOSIS — G301 Alzheimer's disease with late onset: Secondary | ICD-10-CM | POA: Diagnosis not present

## 2019-10-19 DIAGNOSIS — R3915 Urgency of urination: Secondary | ICD-10-CM

## 2019-10-19 DIAGNOSIS — N400 Enlarged prostate without lower urinary tract symptoms: Secondary | ICD-10-CM | POA: Diagnosis not present

## 2019-10-19 DIAGNOSIS — I700 Atherosclerosis of aorta: Secondary | ICD-10-CM

## 2019-10-19 MED ORDER — DABIGATRAN ETEXILATE MESYLATE 150 MG PO CAPS: 150.0000 mg | ORAL_CAPSULE | Freq: Two times a day (BID) | ORAL | 1 refills | Status: DC

## 2019-10-19 MED ORDER — LOSARTAN POTASSIUM 50 MG PO TABS: 50.0000 mg | ORAL_TABLET | Freq: Every day | ORAL | 1 refills | Status: DC

## 2019-10-19 NOTE — Telephone Encounter (Signed)
I called to inform this patient that he has been scheduled to have his MRI of the Abdomen on Friday, August 20th at 8am, but there was no answer nor voicemail so I was not able to leave a message.  He is to arrive at [facility] entrance by 7:30am and has been asked not to have anything to eat or drink 4 hrs prior to his scheduled imaging.    A CRM will be placed.

## 2019-10-19 NOTE — Progress Notes (Signed)
Name: Ronald Fleming   MRN: [id number]    DOB: 1935-11-25   Date:10/19/2019       Progress Note  Subjective  Chief Complaint  Chief Complaint  Patient presents with  . Hypertension  . Atrial Fibrillation  . Benign Prostatic Hypertrophy  . Hyperlipidemia    HPI   HTN:not checking bp lately.No chest painhe has occasional palpation. No dizziness . BP elevated today, but usually at goal   Hyperlipidemia:taking statins, denies side effects,last LDL at goal. He also has atherosclerosis of aorta, is on statin therapy, off aspirin, but takes pradaxa.We will recheck labs   Alzheimer's late onset: wife noticed he had memory loss, seeing Dr Manuella Ghazi ( Neurologist ) and is now on Razadyne12mg  twice dailye has been on medicationsince Fall 2017,doing well on medications.Hisappetite is normal. No wandering , still able to perform all ADL, takes his own medications and still pays bills, driving, he has a good mood . Lives with his wife. He has been doing very well   Pre-diabetes: last hgbA1C was 5.7%  he denies polyphagia, polydipsia, he has BPH and urinary frequency is stable   Afib: doing well, no SOBbut has intermittent palpitation, on Pradaxa and denies easy bleedingor bruising.Sees Dr Clayborn Bigness, he has been off aspirin because of risk of bleeding. Rate controlled   BPH: he has urinary frequency during the day,he also has nocturia twice per nightbut able to fall back asleep ( discussed night light to prevent falls) , he is on Flomax and seesDr.Stoioff.He has a history ofhematuriasecondary to kidney stones. We will recheck PSA may need to follow up with Dr. Bernardo Heater   Pancreas cyst: found on CT for stone search, had repeat MRI 10/2018 and was advised to repeat in one year. He has good appetite, no nausea, vomiting or abdominal pain.Wife has noticed some growling sounds from his stomach, he denies any problems, discussed gas producing food and try to cut down, but for now gave  reassurance   Chronic DVT right leg, also has varicose veins, no pain or discomfort at this time, he has intermittent right lower extremity edema that resolves with compression stocking hoses prnUnchanged   Left inguinal hernia: he has a history of repair on the right side, he has noticed bulging on the left side for months now, no pain , redness, he wants to see surgeon this time, Dr. Rosendo Gros    Patient Active Problem List   Diagnosis Date Noted  . Thrombocytosis ([place]) 10/29/2017  . DDD (degenerative disc disease), lumbosacral 09/11/2017  . Nephrolithiasis 09/11/2017  . Inguinal hernia of right side without obstruction or gangrene 09/11/2017  . Vitamin D deficiency 09/11/2017  . Lesion of pancreas 07/09/2017  . Atherosclerosis of aorta ([place]) 07/01/2017  . Osteopenia 05/21/2017  . Atrioventricular block, second degree 05/29/2016  . Vitamin B12 deficiency 05/21/2016  . Late onset Alzheimer's disease without behavioral disturbance ([place]) 05/21/2016  . Vitiligo 02/12/2016  . History of DVT of lower extremity 08/03/2015  . BPH (benign prostatic hyperplasia) 08/03/2015  . Atrial fibrillation ([place]) 02/01/2015  . Edema leg 08/23/2014  . Neuropathy 08/23/2014  . Benign essential HTN 08/27/2006  . Hypercholesterolemia without hypertriglyceridemia 08/27/2006    Past Surgical History:  Procedure Laterality Date  . INGUINAL HERNIA REPAIR Right 11/10/2017   Procedure: LAPAROSCOPIC RIGHT  INGUINAL HERNIA REPAIR;  Surgeon: Ralene Ok, MD;  Location: [facility];  Service: General;  Laterality: Right;  . INSERTION OF MESH Right 11/10/2017   Procedure: INSERTION OF MESH;  Surgeon: Ralene Ok,  MD;  Location: [facility];  Service: General;  Laterality: Right;  . NO PAST SURGERIES      Family History  Problem Relation Age of Onset  . Coronary artery disease Father   . Stroke Son   . Kidney disease Neg Hx   . Prostate cancer Neg Hx   . Bladder Cancer Neg Hx     Social History   Tobacco  Use  . Smoking status: Never Smoker  . Smokeless tobacco: Never Used  Substance Use Topics  . Alcohol use: Never    Alcohol/week: 0.0 standard drinks     Current Outpatient Medications:  .  atorvastatin (LIPITOR) 20 MG tablet, Take 1 tablet (20 mg total) by mouth daily., Disp: 90 tablet, Rfl: 1 .  Cholecalciferol (VITAMIN D) 2000 units CAPS, Take 1 capsule (2,000 Units total) by mouth daily., Disp: 30 capsule, Rfl: 0 .  dabigatran (PRADAXA) 150 MG CAPS capsule, Take 1 capsule (150 mg total) by mouth 2 (two) times daily., Disp: 180 capsule, Rfl: 1 .  losartan (COZAAR) 50 MG tablet, TAKE 1 TABLET EVERY DAY, Disp: 90 tablet, Rfl: 1 .  tamsulosin (FLOMAX) 0.4 MG CAPS capsule, TAKE 1 CAPSULE EVERY DAY, Disp: 90 capsule, Rfl: 1 .  vitamin B-12 (CYANOCOBALAMIN) 1000 MCG tablet, Take 1,000 mcg by mouth 3 (three) times a week. , Disp: , Rfl:  .  galantamine (RAZADYNE) 12 MG tablet, Take 12 mg by mouth 2 (two) times daily. , Disp: , Rfl:   No Known Allergies  I personally reviewed active problem list, medication list, allergies, family history, social history, health maintenance with the patient/caregiver today.   ROS  Constitutional: Negative for fever or weight change.  Respiratory: Negative for cough and shortness of breath.   Cardiovascular: Negative for chest pain or palpitations.  Gastrointestinal: Negative for abdominal pain, no bowel changes.  Musculoskeletal: Negative for gait problem or joint swelling.  Skin: Negative for rash.  Neurological: Negative for dizziness or headache.  No other specific complaints in a complete review of systems (except as listed in HPI above).  Objective   Vitals:   10/19/19 1117  BP: (!) 144/80  Pulse: 66  Resp: 16  Temp: (!) 97.3 F (36.3 C)  TempSrc: Temporal  SpO2: 99%  Weight: 192 lb 14.4 oz (87.5 kg)  Height: 5\' 11"  (1.803 m)    Body mass index is 26.9 kg/m.  Physical Exam  Constitutional: Patient appears well-developed and  well-nourished. Overweight.  No distress.  HEENT: head atraumatic, normocephalic, pupils equal and reactive to light,  neck supple Cardiovascular: Normal rate, regular rhythm and normal heart sounds.  No murmur heard. No BLE edema. Pulmonary/Chest: Effort normal and breath sounds normal. No respiratory distress. Abdominal: Soft.  There is no tenderness. Left inguinal hernia  Psychiatric: Patient has a normal mood and affect. behavior is normal. Judgment and thought content normal.  PHQ2/9: Depression screen [facility] 2/9 10/19/2019 05/28/2019 02/23/2019 01/27/2019 09/14/2018  Decreased Interest 0 0 0 0 0  Down, Depressed, Hopeless 0 0 0 0 0  PHQ - 2 Score 0 0 0 0 0  Altered sleeping 0 0 - 0 0  Tired, decreased energy 0 0 - 0 0  Change in appetite 0 0 - 0 0  Feeling bad or failure about yourself  0 0 - 0 0  Trouble concentrating 0 0 - 0 0  Moving slowly or fidgety/restless 0 0 - 0 0  Suicidal thoughts 0 0 - 0 0  PHQ-9 Score 0 0 -  0 0  Difficult doing work/chores - Not difficult at all - - Not difficult at all  Some recent data might be hidden    phq 9 is negative   Fall Risk: Fall Risk  10/19/2019 05/28/2019 02/23/2019 01/27/2019 09/14/2018  Falls in the past year? 0 0 0 0 0  Number falls in past yr: 0 0 0 0 0  Injury with Fall? 0 0 0 0 0  Follow up - - Falls prevention discussed - -    Functional Status Survey: Is the patient deaf or have difficulty hearing?: No Does the patient have difficulty seeing, even when wearing glasses/contacts?: No Does the patient have difficulty concentrating, remembering, or making decisions?: No Does the patient have difficulty walking or climbing stairs?: No Does the patient have difficulty dressing or bathing?: No Does the patient have difficulty doing errands alone such as visiting a doctor's office or shopping?: No    Assessment & Plan  1. Paroxysmal atrial fibrillation (HCC)  - dabigatran (PRADAXA) 150 MG CAPS capsule; Take 1 capsule (150 mg total)  by mouth 2 (two) times daily.  Dispense: 180 capsule; Refill: 1  2. Atherosclerosis of aorta ([place])  On statin   3. Benign essential HTN  - losartan (COZAAR) 50 MG tablet; Take 1 tablet (50 mg total) by mouth daily.  Dispense: 90 tablet; Refill: 1  4. Vitamin B12 deficiency   5. Chronic deep vein thrombosis (DVT) of right femoral vein (HCC)  stable  6. Pre-diabetes  - Hemoglobin A1c  7. Chronic kidney disease, stage 2 (mild)  - COMPLETE METABOLIC PANEL WITH GFR - CBC with Differential/Platelet - VITAMIN D 25 Hydroxy (Vit-D Deficiency, Fractures)  8. Hypercholesterolemia without hypertriglyceridemia  - Lipid panel  9. Vitamin D deficiency  Continue vitamin D supplementation   10. Benign prostatic hyperplasia (BPH) with urinary urgency  Recheck PSA may need to go back to Dr. Bernardo Heater last seen 2019  - PSA   11. Late onset Alzheimer's disease without behavioral disturbance ([place])    12. Hyperglycemia  - Hemoglobin A1c  13. Left inguinal hernia  - Ambulatory referral to General Surgery.  14. Cyst of pancreas  - MR Abdomen W Wo Contrast; Future

## 2019-10-20 LAB — CBC WITH DIFFERENTIAL/PLATELET
Absolute Monocytes: 490 cells/uL (ref 200–950)
Basophils Absolute: 30 cells/uL (ref 0–200)
Basophils Relative: 0.5 %
Eosinophils Absolute: 59 cells/uL (ref 15–500)
Eosinophils Relative: 1 %
HCT: 43.7 % (ref 38.5–50.0)
Hemoglobin: 14.4 g/dL (ref 13.2–17.1)
Lymphs Abs: 1186 cells/uL (ref 850–3900)
MCH: 32.1 pg (ref 27.0–33.0)
MCHC: 33 g/dL (ref 32.0–36.0)
MCV: 97.3 fL (ref 80.0–100.0)
MPV: 11.8 fL (ref 7.5–12.5)
Monocytes Relative: 8.3 %
Neutro Abs: 4136 cells/uL (ref 1500–7800)
Neutrophils Relative %: 70.1 %
Platelets: 153 10*3/uL (ref 140–400)
RBC: 4.49 10*6/uL (ref 4.20–5.80)
RDW: 11.8 % (ref 11.0–15.0)
Total Lymphocyte: 20.1 %
WBC: 5.9 10*3/uL (ref 3.8–10.8)

## 2019-10-20 LAB — HEMOGLOBIN A1C
Hgb A1c MFr Bld: 5.8 % of total Hgb — ABNORMAL HIGH (ref <5.7)
Mean Plasma Glucose: 120 (calc)
eAG (mmol/L): 6.6 (calc)

## 2019-10-20 LAB — COMPLETE METABOLIC PANEL WITH GFR
AG Ratio: 1.4 (calc) (ref 1.0–2.5)
ALT: 14 U/L (ref 9–46)
AST: 19 U/L (ref 10–35)
Albumin: 4.1 g/dL (ref 3.6–5.1)
Alkaline phosphatase (APISO): 73 U/L (ref 35–144)
BUN/Creatinine Ratio: 11 (calc) (ref 6–22)
BUN: 16 mg/dL (ref 7–25)
CO2: 29 mmol/L (ref 20–32)
Calcium: 9.4 mg/dL (ref 8.6–10.3)
Chloride: 106 mmol/L (ref 98–110)
Creat: 1.4 mg/dL — ABNORMAL HIGH (ref 0.70–1.11)
GFR, Est African American: 54 mL/min/{1.73_m2} — ABNORMAL LOW (ref >=60)
GFR, Est Non African American: 47 mL/min/{1.73_m2} — ABNORMAL LOW (ref >=60)
Globulin: 2.9 g/dL (calc) (ref 1.9–3.7)
Glucose, Bld: 93 mg/dL (ref 65–99)
Potassium: 4.4 mmol/L (ref 3.5–5.3)
Sodium: 141 mmol/L (ref 135–146)
Total Bilirubin: 0.8 mg/dL (ref 0.2–1.2)
Total Protein: 7 g/dL (ref 6.1–8.1)

## 2019-10-20 LAB — LIPID PANEL
Cholesterol: 146 mg/dL (ref <200)
HDL: 66 mg/dL (ref >=40)
LDL Cholesterol (Calc): 65 mg/dL (calc)
Non-HDL Cholesterol (Calc): 80 mg/dL (calc) (ref <130)
Total CHOL/HDL Ratio: 2.2 (calc) (ref <5.0)
Triglycerides: 74 mg/dL (ref <150)

## 2019-10-20 LAB — VITAMIN D 25 HYDROXY (VIT D DEFICIENCY, FRACTURES): Vit D, 25-Hydroxy: 23 ng/mL — ABNORMAL LOW (ref 30–100)

## 2019-10-20 LAB — PSA: PSA: 4.3 ng/mL — ABNORMAL HIGH (ref <=4.0)

## 2019-11-05 ENCOUNTER — Other Ambulatory Visit: Payer: Self-pay

## 2019-11-05 ENCOUNTER — Ambulatory Visit
Admission: RE | Admit: 2019-11-05 | Discharge: 2019-11-05 | Disposition: A | Discharge status: Home / Self Care | Payer: Medicare PPO | Source: Ambulatory Visit | Attending: Family Medicine | Admitting: Family Medicine

## 2019-11-05 DIAGNOSIS — K862 Cyst of pancreas: Secondary | ICD-10-CM | POA: Insufficient documentation

## 2019-11-05 DIAGNOSIS — K8689 Other specified diseases of pancreas: Secondary | ICD-10-CM | POA: Diagnosis not present

## 2019-11-05 DIAGNOSIS — N281 Cyst of kidney, acquired: Secondary | ICD-10-CM | POA: Diagnosis not present

## 2019-11-05 MED ORDER — GADOBUTROL 1 MMOL/ML IV SOLN
8.0000 mL | Freq: Once | INTRAVENOUS | Status: AC | PRN
  Administered 2019-11-05: 8 mL via INTRAVENOUS

## 2019-11-24 DIAGNOSIS — I495 Sick sinus syndrome: Secondary | ICD-10-CM | POA: Diagnosis not present

## 2019-11-24 DIAGNOSIS — E785 Hyperlipidemia, unspecified: Secondary | ICD-10-CM | POA: Diagnosis not present

## 2019-11-24 DIAGNOSIS — R001 Bradycardia, unspecified: Secondary | ICD-10-CM | POA: Diagnosis not present

## 2019-11-24 DIAGNOSIS — R0602 Shortness of breath: Secondary | ICD-10-CM | POA: Diagnosis not present

## 2019-11-24 DIAGNOSIS — R002 Palpitations: Secondary | ICD-10-CM | POA: Diagnosis not present

## 2019-11-24 DIAGNOSIS — R011 Cardiac murmur, unspecified: Secondary | ICD-10-CM | POA: Diagnosis not present

## 2019-11-24 DIAGNOSIS — I480 Paroxysmal atrial fibrillation: Secondary | ICD-10-CM | POA: Diagnosis not present

## 2019-11-24 DIAGNOSIS — I208 Other forms of angina pectoris: Secondary | ICD-10-CM | POA: Diagnosis not present

## 2019-11-24 DIAGNOSIS — I10 Essential (primary) hypertension: Secondary | ICD-10-CM | POA: Diagnosis not present

## 2019-12-01 ENCOUNTER — Ambulatory Visit (INDEPENDENT_AMBULATORY_CARE_PROVIDER_SITE_OTHER): Payer: Medicare PPO

## 2019-12-01 ENCOUNTER — Other Ambulatory Visit: Payer: Self-pay

## 2019-12-01 DIAGNOSIS — Z23 Encounter for immunization: Secondary | ICD-10-CM

## 2020-01-03 ENCOUNTER — Ambulatory Visit: Payer: Self-pay | Admitting: General Surgery

## 2020-01-03 DIAGNOSIS — K4090 Unilateral inguinal hernia, without obstruction or gangrene, not specified as recurrent: Secondary | ICD-10-CM | POA: Diagnosis not present

## 2020-01-03 NOTE — H&P (Signed)
  History of Present Illness Ralene Ok MD; 01/03/2020 3:39 PM) The patient is a 84 year old male who presents with an inguinal hernia. Chief Complaint: Recurrent hernia  Patient is a 84 year old male, who underwent laparoscopic converted to open right inguinal hernia repair with mesh in February 2021. Patient states that after [place] 2 months he had some discomfort and pain to the right inguinal area. He did notice a bulge the right inguinal area. He states he continues to work and do some lifting with his work. He states he does find it very uncomfortable.     Allergies (Chanel Teressa Senter, CMA; 01/03/2020 3:28 PM) No Known Drug Allergies  [04/19/2019]: Allergies Reconciled   Medication History (Chanel Teressa Senter, CMA; 01/03/2020 3:28 PM) traMADol HCl (50MG  Tablet, Oral) Active. Aspir-Low (81MG  Tablet DR, Oral) Active. Tamsulosin HCl (0.4MG  Capsule, Oral) Active. Finasteride (5MG  Tablet, Oral) Active. diazePAM (10MG  Tablet, Oral) Active. Omeprazole (20MG  Tab DR Disint, Oral) Active. Allopurinol (300MG  Tablet, Oral) Active. Pepcid (40MG  Tablet, Oral) Active. Medications Reconciled    Review of Systems Ralene Ok, MD; 01/03/2020 3:41 PM) All other systems negative  Vitals (Chanel Nolan CMA; 01/03/2020 3:28 PM) 01/03/2020 3:28 PM Weight: 192.5 lb Height: 70in Body Surface Area: 2.05 m Body Mass Index: 27.62 kg/m  Temp.: 48F  Pulse: 93 (Regular)  BP: 128/74(Sitting, Left Arm, Standard)       Physical Exam Ralene Ok MD; 01/03/2020 3:40 PM) The physical exam findings are as follows: Note: Constitutional: No acute distress, conversant, appears stated age  Eyes: Anicteric sclerae, moist conjunctiva, no lid lag  Neck: No thyromegaly, trachea midline, no cervical lymphadenopathy  Lungs: Clear to auscultation biilaterally, normal respiratory effot  Cardiovascular: regular rate & rhythm, no murmurs, no peripheal edema, pedal pulses  2+  GI: Soft, no masses or hepatosplenomegaly, non-tender to palpation  MSK: Normal gait, no clubbing cyanosis, edema  Skin: No rashes, palpation reveals normal skin turgor  Psychiatric: Appropriate judgment and insight, oriented to person, place, and time  Abdomen Inspection Hernias - Right - Inguinal hernia - Reducible - Right.    Assessment & Plan Ralene Ok MD; 01/03/2020 3:40 PM) RECURRENT RIGHT INGUINAL HERNIA (K40.91) Impression: Patient is a 84 year old male with a recurrent right inguinal hernia Secondary to patient discomfort the patient will like to have this repaired.  1. The patient will like to proceed to the operating room for robotic versus open right inguinal hernia repair with mesh.  2. I discussed with the patient the signs and symptoms of incarceration and strangulation and the need to proceed to the ER should they occur.  3. I discussed with the patient the risks and benefits of the procedure to include but not limited to: Infection, bleeding, damage to surrounding structures, possible need for further surgery, possible nerve pain, and possible recurrence. The patient was understanding and wishes to proceed.

## 2020-02-19 ENCOUNTER — Other Ambulatory Visit (HOSPITAL_COMMUNITY)
Admission: RE | Admit: 2020-02-19 | Discharge: 2020-02-19 | Disposition: A | Discharge status: Home / Self Care | Payer: Medicare PPO | Source: Ambulatory Visit | Attending: General Surgery | Admitting: General Surgery

## 2020-02-19 DIAGNOSIS — Z01812 Encounter for preprocedural laboratory examination: Secondary | ICD-10-CM | POA: Diagnosis not present

## 2020-02-19 DIAGNOSIS — Z20822 Contact with and (suspected) exposure to covid-19: Secondary | ICD-10-CM | POA: Insufficient documentation

## 2020-02-20 LAB — SARS CORONAVIRUS 2 (TAT 6-24 HRS): SARS Coronavirus 2: NEGATIVE

## 2020-02-22 ENCOUNTER — Encounter (HOSPITAL_COMMUNITY): Payer: Self-pay | Admitting: General Surgery

## 2020-02-22 NOTE — Anesthesia Preprocedure Evaluation (Addendum)
Anesthesia Evaluation  Patient identified by MRN, date of birth, ID band Patient awake    Reviewed: Allergy & Precautions, NPO status , Patient's Chart, lab work & pertinent test results  Airway Mallampati: I  TM Distance: >3 FB Neck ROM: Full    Dental no notable dental hx.    Pulmonary neg pulmonary ROS,    Pulmonary exam normal breath sounds clear to auscultation       Cardiovascular hypertension, Normal cardiovascular exam+ dysrhythmias (afib)  Rhythm:Regular Rate:Normal  EKG's in past show SR with frequent PVCs.   History of paroxysmal A. fib on Pradaxa.  Cleared by PCP Dr. Ancil Boozer to hold for 72 hours preop.  Patient also follows with cardiologist Dr. Edd [facility] for his paroxysmal A. fib as well as bradycardia/sick sinus syndrome.  Last seen 11/24/2019.  Per note, he has bradycardia due to sick sinus syndrome but has no indication for permanent pacemaker at this point.  No history of syncope.  No changes to management, advised to follow-up in 6 months.   Neuro/Psych PSYCHIATRIC DISORDERS Dementia negative neurological ROS     GI/Hepatic   Endo/Other  negative endocrine ROS  Renal/GU Renal disease (kidney stone)     Musculoskeletal  (+) Arthritis ,   Abdominal   Peds negative pediatric ROS (+)  Hematology   Anesthesia Other Findings   Reproductive/Obstetrics                         Anesthesia Plan  Anesthesia Plan Comments:        Anesthesia Quick Evaluation  Anesthesia Physical Anesthesia Plan  ASA: III  Anesthesia Plan: General   Post-op Pain Management:    Induction: Intravenous  PONV Risk Score and Plan: 2 and Ondansetron and Dexamethasone  Airway Management Planned: Oral ETT  Additional Equipment:   Intra-op Plan:   Post-operative Plan: Extubation in OR  Informed Consent: I have reviewed the patients History and Physical, chart, labs and discussed the  procedure including the risks, benefits and alternatives for the proposed anesthesia with the patient or authorized representative who has indicated his/her understanding and acceptance.       Plan Discussed with: CRNA and Anesthesiologist  Anesthesia Plan Comments: ( Stopped pradaxa 3 days ago)      Anesthesia Quick Evaluation

## 2020-02-22 NOTE — Progress Notes (Signed)
Anesthesia Chart Review: Same day workup  History of paroxysmal A. fib on Pradaxa.  Cleared by PCP Dr. Ancil Boozer to hold for 72 hours preop.  Patient also follows with cardiologist Dr. Edd [facility] for his paroxysmal A. fib as well as bradycardia/sick sinus syndrome.  Last seen 11/24/2019.  Per note, he has bradycardia due to sick sinus syndrome but has no indication for permanent pacemaker at this point.  No history of syncope.  No changes to management, advised to follow-up in 6 months.  Hx of CKD 2.   Chronic DVT of R femoral vein.   Will need day of surgery labs and evaluation.  Nuclear stress 10/31/2016 (Care Everywhere): Borderline myocardial perfusion scan no evidence of  stress-induced myocardial ischemia borderline left ventricular function  and ejection fraction of 45-50 % recommend medical therapy for now   TTE 10/30/2016 (Care Everywhere): INTERPRETATION  REGIONALLY-IMPAIRED LEFT VENTRICLE WITH PRESERVED SYSTOLIC FUNCTION; ESTIAMTED EF = 45-50  %  NORMAL RIGHT VENTRICULAR SYSTOLIC FUNCTION  MILD TRICUSPID, MITRAL AND AORTIC VALVE INSUFFICIENCY  NO VALVULAR STENOSIS  MILD LV ENLARGEMENT  MILD LA ENLARGEMENT     Wynonia Musty [facility] Short Stay Center/Anesthesiology Phone [phone] 02/22/2020 12:02 PM

## 2020-02-22 NOTE — Progress Notes (Signed)
Spoke with pt for pre-op call. Pt has hx of A-fib and is on Pradaxa. Was instructed to hold 72 hours prior to surgery by his PCP Dr. Ancil Boozer. Last dose per pt was the pm dose on 02/20/20. Pt has cardiac clearance from Dr. Ancil Boozer. He also sees Dr. Clayborn Bigness, cardiologist at [facility]. Last visit was 11/24/19. Pt denies any recent chest pain or sob. Pt states he is not diabetic.  Covid test done on 02/19/20 and it's negative.  Pt states he's been in quarantine since the test was done and understands that he stays in quarantine until he comes to the hospital tomorrow.

## 2020-02-23 ENCOUNTER — Ambulatory Visit (HOSPITAL_COMMUNITY): Payer: Medicare PPO | Admitting: Physician Assistant

## 2020-02-23 ENCOUNTER — Other Ambulatory Visit: Payer: Self-pay

## 2020-02-23 ENCOUNTER — Encounter (HOSPITAL_COMMUNITY)
Admission: RE | Disposition: A | Discharge status: Home / Self Care | Payer: Self-pay | Source: Home / Self Care | Attending: General Surgery

## 2020-02-23 ENCOUNTER — Encounter (HOSPITAL_COMMUNITY): Payer: Self-pay | Admitting: General Surgery

## 2020-02-23 ENCOUNTER — Ambulatory Visit (HOSPITAL_COMMUNITY)
Admission: RE | Admit: 2020-02-23 | Discharge: 2020-02-23 | Disposition: A | Discharge status: Home / Self Care | Payer: Medicare PPO | Attending: General Surgery | Admitting: General Surgery

## 2020-02-23 DIAGNOSIS — Z79899 Other long term (current) drug therapy: Secondary | ICD-10-CM | POA: Diagnosis not present

## 2020-02-23 DIAGNOSIS — E7800 Pure hypercholesterolemia, unspecified: Secondary | ICD-10-CM | POA: Diagnosis not present

## 2020-02-23 DIAGNOSIS — Z7901 Long term (current) use of anticoagulants: Secondary | ICD-10-CM | POA: Insufficient documentation

## 2020-02-23 DIAGNOSIS — I495 Sick sinus syndrome: Secondary | ICD-10-CM | POA: Diagnosis not present

## 2020-02-23 DIAGNOSIS — F0280 Dementia in other diseases classified elsewhere without behavioral disturbance: Secondary | ICD-10-CM | POA: Insufficient documentation

## 2020-02-23 DIAGNOSIS — K4090 Unilateral inguinal hernia, without obstruction or gangrene, not specified as recurrent: Secondary | ICD-10-CM | POA: Diagnosis not present

## 2020-02-23 DIAGNOSIS — E559 Vitamin D deficiency, unspecified: Secondary | ICD-10-CM | POA: Diagnosis not present

## 2020-02-23 DIAGNOSIS — I480 Paroxysmal atrial fibrillation: Secondary | ICD-10-CM | POA: Insufficient documentation

## 2020-02-23 DIAGNOSIS — I10 Essential (primary) hypertension: Secondary | ICD-10-CM | POA: Diagnosis not present

## 2020-02-23 DIAGNOSIS — N182 Chronic kidney disease, stage 2 (mild): Secondary | ICD-10-CM | POA: Insufficient documentation

## 2020-02-23 DIAGNOSIS — G309 Alzheimer's disease, unspecified: Secondary | ICD-10-CM | POA: Insufficient documentation

## 2020-02-23 DIAGNOSIS — Z86718 Personal history of other venous thrombosis and embolism: Secondary | ICD-10-CM | POA: Insufficient documentation

## 2020-02-23 HISTORY — PX: INGUINAL HERNIA REPAIR: SHX194

## 2020-02-23 LAB — CBC
HCT: 43.3 % (ref 39.0–52.0)
Hemoglobin: 14.6 g/dL (ref 13.0–17.0)
MCH: 33.2 pg (ref 26.0–34.0)
MCHC: 33.7 g/dL (ref 30.0–36.0)
MCV: 98.4 fL (ref 80.0–100.0)
Platelets: 128 10*3/uL — ABNORMAL LOW (ref 150–400)
RBC: 4.4 MIL/uL (ref 4.22–5.81)
RDW: 12.1 % (ref 11.5–15.5)
WBC: 5.2 10*3/uL (ref 4.0–10.5)
nRBC: 0 % (ref 0.0–0.2)

## 2020-02-23 LAB — BASIC METABOLIC PANEL
Anion gap: 9 (ref 5–15)
BUN: 21 mg/dL (ref 8–23)
CO2: 24 mmol/L (ref 22–32)
Calcium: 9 mg/dL (ref 8.9–10.3)
Chloride: 107 mmol/L (ref 98–111)
Creatinine, Ser: 1.3 mg/dL — ABNORMAL HIGH (ref 0.61–1.24)
GFR, Estimated: 55 mL/min — ABNORMAL LOW (ref >60)
Glucose, Bld: 88 mg/dL (ref 70–99)
Potassium: 3.8 mmol/L (ref 3.5–5.1)
Sodium: 140 mmol/L (ref 135–145)

## 2020-02-23 SURGERY — REPAIR, HERNIA, INGUINAL, LAPAROSCOPIC
Anesthesia: General | Laterality: Left

## 2020-02-23 MED ORDER — ROCURONIUM BROMIDE 10 MG/ML (PF) SYRINGE
PREFILLED_SYRINGE | INTRAVENOUS | Status: AC
  Filled 2020-02-23: qty 10

## 2020-02-23 MED ORDER — PROPOFOL 10 MG/ML IV BOLUS
INTRAVENOUS | Status: AC
  Filled 2020-02-23: qty 20

## 2020-02-23 MED ORDER — ESMOLOL HCL 100 MG/10ML IV SOLN
INTRAVENOUS | Status: DC | PRN
  Administered 2020-02-23: 30 mg via INTRAVENOUS

## 2020-02-23 MED ORDER — PROPOFOL 10 MG/ML IV BOLUS
INTRAVENOUS | Status: DC | PRN
  Administered 2020-02-23: 150 mg via INTRAVENOUS

## 2020-02-23 MED ORDER — ENSURE PRE-SURGERY PO LIQD: 296.0000 mL | Freq: Once | ORAL | Status: DC

## 2020-02-23 MED ORDER — CHLORHEXIDINE GLUCONATE CLOTH 2 % EX PADS: 6.0000 | MEDICATED_PAD | Freq: Once | CUTANEOUS | Status: DC

## 2020-02-23 MED ORDER — CEFAZOLIN SODIUM-DEXTROSE 2-4 GM/100ML-% IV SOLN
2.0000 g | INTRAVENOUS | Status: AC
  Administered 2020-02-23: 2 g via INTRAVENOUS
  Filled 2020-02-23: qty 100

## 2020-02-23 MED ORDER — 0.9 % SODIUM CHLORIDE (POUR BTL) OPTIME
TOPICAL | Status: DC | PRN
  Administered 2020-02-23: 1000 mL

## 2020-02-23 MED ORDER — ONDANSETRON HCL 4 MG/2ML IJ SOLN
INTRAMUSCULAR | Status: DC | PRN
  Administered 2020-02-23: 4 mg via INTRAVENOUS

## 2020-02-23 MED ORDER — LIDOCAINE 2% (20 MG/ML) 5 ML SYRINGE
INTRAMUSCULAR | Status: DC | PRN
  Administered 2020-02-23: 80 mg via INTRAVENOUS

## 2020-02-23 MED ORDER — SUGAMMADEX SODIUM 200 MG/2ML IV SOLN
INTRAVENOUS | Status: DC | PRN
  Administered 2020-02-23: 200 mg via INTRAVENOUS

## 2020-02-23 MED ORDER — FENTANYL CITRATE (PF) 100 MCG/2ML IJ SOLN: 25.0000 ug | INTRAMUSCULAR | Status: DC | PRN

## 2020-02-23 MED ORDER — ONDANSETRON HCL 4 MG/2ML IJ SOLN
INTRAMUSCULAR | Status: AC
  Filled 2020-02-23: qty 2

## 2020-02-23 MED ORDER — LACTATED RINGERS IV SOLN: INTRAVENOUS | Status: DC

## 2020-02-23 MED ORDER — ORAL CARE MOUTH RINSE: 15.0000 mL | Freq: Once | OROMUCOSAL | Status: AC

## 2020-02-23 MED ORDER — CHLORHEXIDINE GLUCONATE 0.12 % MT SOLN
15.0000 mL | Freq: Once | OROMUCOSAL | Status: AC
  Administered 2020-02-23: 15 mL via OROMUCOSAL
  Filled 2020-02-23: qty 15

## 2020-02-23 MED ORDER — DEXAMETHASONE SODIUM PHOSPHATE 10 MG/ML IJ SOLN
INTRAMUSCULAR | Status: AC
  Filled 2020-02-23: qty 1

## 2020-02-23 MED ORDER — BUPIVACAINE HCL 0.25 % IJ SOLN
INTRAMUSCULAR | Status: DC | PRN
  Administered 2020-02-23: 7 mL

## 2020-02-23 MED ORDER — ROCURONIUM BROMIDE 10 MG/ML (PF) SYRINGE
PREFILLED_SYRINGE | INTRAVENOUS | Status: DC | PRN
  Administered 2020-02-23: 80 mg via INTRAVENOUS

## 2020-02-23 MED ORDER — ONDANSETRON HCL 4 MG/2ML IJ SOLN: 4.0000 mg | Freq: Once | INTRAMUSCULAR | Status: DC | PRN

## 2020-02-23 MED ORDER — TRAMADOL HCL 50 MG PO TABS
50.0000 mg | ORAL_TABLET | Freq: Four times a day (QID) | ORAL | 0 refills | Status: DC | PRN
Start: 2020-02-23 — End: 2020-02-29

## 2020-02-23 MED ORDER — FENTANYL CITRATE (PF) 250 MCG/5ML IJ SOLN
INTRAMUSCULAR | Status: DC | PRN
  Administered 2020-02-23: 50 ug via INTRAVENOUS

## 2020-02-23 MED ORDER — FENTANYL CITRATE (PF) 250 MCG/5ML IJ SOLN
INTRAMUSCULAR | Status: AC
  Filled 2020-02-23: qty 5

## 2020-02-23 MED ORDER — ACETAMINOPHEN 500 MG PO TABS
1000.0000 mg | ORAL_TABLET | ORAL | Status: AC
  Administered 2020-02-23: 1000 mg via ORAL
  Filled 2020-02-23: qty 2

## 2020-02-23 MED ORDER — DEXAMETHASONE SODIUM PHOSPHATE 10 MG/ML IJ SOLN
INTRAMUSCULAR | Status: DC | PRN
  Administered 2020-02-23: 4 mg via INTRAVENOUS

## 2020-02-23 SURGICAL SUPPLY — 39 items
CANISTER SUCT 3000ML PPV (MISCELLANEOUS) IMPLANT
COVER SURGICAL LIGHT HANDLE (MISCELLANEOUS) ×3 IMPLANT
COVER WAND RF STERILE (DRAPES) IMPLANT
DERMABOND ADVANCED (GAUZE/BANDAGES/DRESSINGS) ×2
DERMABOND ADVANCED .7 DNX12 (GAUZE/BANDAGES/DRESSINGS) ×1 IMPLANT
DISSECTOR BLUNT TIP ENDO 5MM (MISCELLANEOUS) IMPLANT
ELECT REM PT RETURN 9FT ADLT (ELECTROSURGICAL) ×3
ELECTRODE REM PT RTRN 9FT ADLT (ELECTROSURGICAL) ×1 IMPLANT
GLOVE BIO SURGEON STRL SZ7.5 (GLOVE) ×3 IMPLANT
GOWN STRL REUS W/ TWL LRG LVL3 (GOWN DISPOSABLE) ×2 IMPLANT
GOWN STRL REUS W/ TWL XL LVL3 (GOWN DISPOSABLE) ×1 IMPLANT
GOWN STRL REUS W/TWL LRG LVL3 (GOWN DISPOSABLE) ×4
GOWN STRL REUS W/TWL XL LVL3 (GOWN DISPOSABLE) ×2
KIT BASIN OR (CUSTOM PROCEDURE TRAY) ×3 IMPLANT
KIT TURNOVER KIT B (KITS) ×3 IMPLANT
MESH 3DMAX 5X7 LT XLRG (Mesh General) ×3 IMPLANT
NEEDLE INSUFFLATION 14GA 120MM (NEEDLE) ×3 IMPLANT
NS IRRIG 1000ML POUR BTL (IV SOLUTION) ×3 IMPLANT
PAD ARMBOARD 7.5X6 YLW CONV (MISCELLANEOUS) ×6 IMPLANT
RELOAD STAPLE HERNIA 4.0 BLUE (INSTRUMENTS) ×3 IMPLANT
RELOAD STAPLE HERNIA 4.8 BLK (STAPLE) IMPLANT
SCISSORS LAP 5X35 DISP (ENDOMECHANICALS) ×3 IMPLANT
SET IRRIG TUBING LAPAROSCOPIC (IRRIGATION / IRRIGATOR) IMPLANT
SET TUBE SMOKE EVAC HIGH FLOW (TUBING) ×3 IMPLANT
STAPLER HERNIA 12 8.5 360D (INSTRUMENTS) ×3 IMPLANT
SUT MNCRL AB 4-0 PS2 18 (SUTURE) ×3 IMPLANT
SUT VIC AB 1 CT1 27 (SUTURE)
SUT VIC AB 1 CT1 27XBRD ANBCTR (SUTURE) IMPLANT
SYR CONTROL 10ML LL (SYRINGE) ×3 IMPLANT
SYRINGE TOOMEY DISP (SYRINGE) ×3 IMPLANT
TOWEL GREEN STERILE (TOWEL DISPOSABLE) ×3 IMPLANT
TOWEL GREEN STERILE FF (TOWEL DISPOSABLE) ×3 IMPLANT
TRAY FOLEY W/BAG SLVR 14FR (SET/KITS/TRAYS/PACK) ×3 IMPLANT
TRAY LAPAROSCOPIC MC (CUSTOM PROCEDURE TRAY) ×3 IMPLANT
TROCAR OPTICAL SHORT 5MM (TROCAR) ×3 IMPLANT
TROCAR OPTICAL SLV SHORT 5MM (TROCAR) ×3 IMPLANT
TROCAR XCEL 12X100 BLDLESS (ENDOMECHANICALS) ×3 IMPLANT
WARMER LAPAROSCOPE (MISCELLANEOUS) ×3 IMPLANT
WATER STERILE IRR 1000ML POUR (IV SOLUTION) ×3 IMPLANT

## 2020-02-23 NOTE — Op Note (Signed)
02/23/2020  10:57 AM  PATIENT:  Ronald Fleming  84 y.o. male  PRE-OPERATIVE DIAGNOSIS:  LEFT INGUINAL HERNIA  POST-OPERATIVE DIAGNOSIS:  LEFT PANTALOON INGUINAL HERNIA  PROCEDURE:  Procedure(s): LAPAROSCOPIC LEFT  INGUINAL HERNIA WITH MESH (Left)  SURGEON:  Surgeon(s) and Role:    Ralene Ok, MD - Primary    * Lovick, Montel Culver, MD - Assisting who was integral in assisting with camera handling and closure.  ANESTHESIA:   local and general  EBL:  minimal   BLOOD ADMINISTERED:none  DRAINS: none   LOCAL MEDICATIONS USED:  BUPIVICAINE   SPECIMEN:  No Specimen  DISPOSITION OF SPECIMEN:  N/A  COUNTS:  YES  TOURNIQUET:  * No tourniquets in log *  DICTATION: .Dragon Dictation Counts: reported as correct x 2  Findings:  The patient had a  Left pantaloon hernia  Indications for procedure:  The patient is a 84 year old male with a left hernia for several months. Patient complained of symptomatology to his left inguinal area. The patient was taken back for elective inguinal hernia repair.  Details of the procedure: The patient was taken back to the operating room. The patient was placed in supine position with bilateral SCDs in place.  The patient was prepped and draped in the usual sterile fashion.  After appropriate anitbiotics were confirmed, a time-out was confirmed and all facts were verified.  0.25% Marcaine was used to infiltrate the umbilical area. A 11-blade was used to cut down the skin and blunt dissection was used to get the anterior fashion.  The anterior fascia was incised approximately 1 cm and the muscles were retracted laterally. Blunt dissection was then used to create a space in the preperitoneal area. At this time a 10 mm camera was then introduced into the space and advanced the pubic tubercle and a 12 mm trocar was placed over this and insufflation was started.  At this time and space was created from medial to laterally the preperitoneal space.  Cooper's  ligament was initially cleaned off.  The hernia was identified in the direct space. Dissection of the hernia sac was undertaken and it was fully reduced.  The transversalis fascia retracted spontaneously.    The spermatic cord and the vas deferens was identified and protected in all parts of the case. There was also a large hernia sac that was dissected back.  Cremasterics were cleaned off from the cord. The peritoneum was taken down to approximately the umbilicus a Bard 3D Max mesh, size: Rachelle Hora, was  introduced into the preperitoneal space.  The mesh was brought over to cover the direct and indirect hernia spaces.  This was anchored into place and secured to Cooper's ligament with 4.29mm staples from a [place] hernia stapler. It was anchored to the anterior abdominal wall with 4.8 mm staples. The peritoneum was seen lying posterior to the mesh. There was no staples placed laterally. The insufflation was evacuated and the peritoneum was seen posterior to the mesh. The trochars were removed. The anterior fascia was reapproximated using #1 Vicryl on a UR- 6.  Intra-abdominal air was evacuated and the Veress needle removed. The skin was reapproximated using 4-0 Monocryl subcuticular fashion.  The skin was dressed with Dermabond.  The patient was awakened from general anesthesia and taken to recovery in stable condition.   PLAN OF CARE: Discharge to home after PACU  PATIENT DISPOSITION:  PACU - hemodynamically stable.   Delay start of Pharmacological VTE agent (>24hrs) due to surgical blood loss or  risk of bleeding: not applicable

## 2020-02-23 NOTE — Progress Notes (Signed)
Pt brought wallet back with him to short stay. Pt requests that his son be given his wallet to keep during his surgery. Wallet placed in ziploc bag with patient name label and given to patients son, Nehemyah Foushee.

## 2020-02-23 NOTE — H&P (Signed)
  History of Present Illness  The patient is a 84 year old male who presents with an inguinal hernia. Patient is an 84 year old male, with history of a left inguinal hernia is been there for several months. Patient has a history of lower extremity DVT-on Pradaxa. Patient his history of Alzheimer disease. According to the patient's on he notices some discomfort and pain when the patient is lifting, straining. Patient tries to stay active per the patient's son.   Patient has had a previous laparoscopic right inguinal hernia repair with mesh.  I discussed with the patient and his son the possibility of trying a truss however the patient states he would like to have his hernia repaired.    Allergies  No Known Drug Allergies  [09/12/2017]: Allergies Reconciled   Medication History Atorvastatin Calcium (20MG  Tablet, Oral) Active. Galantamine Hydrobromide (12MG  Tablet, Oral) Active. Losartan Potassium (50MG  Tablet, Oral) Active. Pradaxa (150MG  Capsule, Oral) Active. Tamsulosin HCl (0.4MG  Capsule, Oral) Active. Vitamin D (Ergocalciferol) (50000UNIT Capsule, Oral) Active. Vitamin B12 (100MCG Tablet, Oral) Active. Medications Reconciled    Review of Systems  All other systems negative  BP (!) 168/96   Pulse 60   Temp (!) 97.5 F (36.4 C) (Temporal)   Resp 18   Ht 5\' 11"  (1.803 m)   Wt 86.2 kg   SpO2 100%   BMI 26.50 kg/m      Physical Exam  The physical exam findings are as follows: Note: Constitutional: No acute distress, conversant, appears stated age  Eyes: Anicteric sclerae, moist conjunctiva, no lid lag  Neck: No thyromegaly, trachea midline, no cervical lymphadenopathy  Lungs: Clear to auscultation biilaterally, normal respiratory effot  Cardiovascular: regular rate & rhythm, no murmurs, no peripheal edema, pedal pulses 2+  GI: Soft, no masses or hepatosplenomegaly, non-tender to palpation  MSK: Normal gait, no clubbing cyanosis, edema  Skin: No  rashes, palpation reveals normal skin turgor  Psychiatric: Appropriate judgment and insight, oriented to person, place, and time  Abdomen Inspection Hernias - Left - Inguinal hernia - Reducible - Left.    Assessment & Plan LEFT INGUINAL HERNIA (K40.90) Impression: Patient is a 84 year old male with a left inguinal hernia, history of Alzheimer's, history of DVT 1. Will recheck Dr. Ancil Boozer for evaluation of the office Pradaxa. - The patient will like to proceed to the operating room for laparoscopic left inguinal hernia repair with mesh.  2. I discussed with the patient the signs and symptoms of incarceration and strangulation and the need to proceed to the ER should they occur.  3. I discussed with the patient the risks and benefits of the procedure to include but not limited to: Infection, bleeding, damage to surrounding structures, possible need for further surgery, possible nerve pain, and possible recurrence. The patient was understanding and wishes to proceed.

## 2020-02-23 NOTE — Discharge Instructions (Signed)
[facility] _______[facility] °INGUINAL HERNIA REPAIR: POST OP INSTRUCTIONS ° °Always review your discharge instruction sheet given to you by the facility where your surgery was performed. °IF YOU HAVE DISABILITY OR FAMILY LEAVE FORMS, YOU MUST BRING THEM TO THE OFFICE FOR PROCESSING.   °DO NOT GIVE THEM TO YOUR DOCTOR. ° °1. A  prescription for pain medication may be given to you upon discharge.  Take your pain medication as prescribed, if needed.  If narcotic pain medicine is not needed, then you may take acetaminophen (Tylenol) or ibuprofen (Advil) as needed. °2. Take your usually prescribed medications unless otherwise directed. °If you need a refill on your pain medication, please contact your pharmacy.  They will contact our office to request authorization. Prescriptions will not be filled after 5 pm or on week-ends. °3. You should follow a light diet the first 24 hours after arrival home, such as soup and crackers, etc.  Be sure to include lots of fluids daily.  Resume your normal diet the day after surgery. °4.Most patients will experience some swelling and bruising around the umbilicus or in the groin and scrotum.  Ice packs and reclining will help.  Swelling and bruising can take several days to resolve.  °6. It is common to experience some constipation if taking pain medication after surgery.  Increasing fluid intake and taking a stool softener (such as Colace) will usually help or prevent this problem from occurring.  A mild laxative (Milk of Magnesia or Miralax) should be taken according to package directions if there are no bowel movements after 48 hours. °7. Unless discharge instructions indicate otherwise, you may remove your bandages 24-48 hours after surgery, and you may shower at that time.  You may have steri-strips (small skin tapes) in place directly over the incision.  These strips should be left on the skin for 7-10 days.  If your surgeon used skin glue on the incision, you may  shower in 24 hours.  The glue will flake off over the next 2-3 weeks.  Any sutures or staples will be removed at the office during your follow-up visit. °8. ACTIVITIES:  You may resume regular (light) daily activities beginning the next day--such as daily self-care, walking, climbing stairs--gradually increasing activities as tolerated.  You may have sexual intercourse when it is comfortable.  Refrain from any heavy lifting or straining until approved by your doctor. ° °a.You may drive when you are no longer taking prescription pain medication, you can comfortably wear a seatbelt, and you can safely maneuver your car and apply brakes. °b.RETURN TO WORK:   °_____________________________________________ ° °9.You should see your doctor in the office for a follow-up appointment approximately 2-3 weeks after your surgery.  Make sure that you call for this appointment within a day or two after you arrive home to insure a convenient appointment time. °10.OTHER INSTRUCTIONS: _________________________ °   _____________________________________ ° °WHEN TO CALL YOUR DOCTOR: °1. Fever over 101.0 °2. Inability to urinate °3. Nausea and/or vomiting °4. Extreme swelling or bruising °5. Continued bleeding from incision. °6. Increased pain, redness, or drainage from the incision ° °The clinic staff is available to answer your questions during regular business hours.  Please don’t hesitate to call and ask to speak to one of the nurses for clinical concerns.  If you have a medical emergency, go to the nearest emergency room or call 911.  A surgeon from [facility] is always on call at the hospital ° ° °[address], [address], [place], [place]  [address] ? ° [address] [address], [place], [place]   [address] °[phone] ? [phone] ? FAX [phone] °Web site: [email or web address] ° °

## 2020-02-23 NOTE — Anesthesia Procedure Notes (Signed)
Procedure Name: Intubation Performed by: Milford Cage, CRNA Pre-anesthesia Checklist: Patient identified, Emergency Drugs available, Suction available and Patient being monitored Patient Re-evaluated:Patient Re-evaluated prior to induction Oxygen Delivery Method: Circle System Utilized Preoxygenation: Pre-oxygenation with 100% oxygen Induction Type: IV induction Ventilation: Mask ventilation without difficulty Grade View: Grade I Tube type: Oral Tube size: 7.0 mm Number of attempts: 1 Airway Equipment and Method: Stylet and Oral airway Placement Confirmation: ETT inserted through vocal cords under direct vision,  positive ETCO2 and breath sounds checked- equal and bilateral Secured at: 23 cm Tube secured with: Tape Dental Injury: Teeth and Oropharynx as per pre-operative assessment

## 2020-02-23 NOTE — Anesthesia Postprocedure Evaluation (Addendum)
Anesthesia Post Note  Patient: Ronald Fleming  Procedure(s) Performed: LAPAROSCOPIC LEFT  INGUINAL HERNIA WITH MESH (Left )     Patient location during evaluation: PACU Anesthesia Type: General Level of consciousness: sedated Pain management: pain level controlled Vital Signs Assessment: post-procedure vital signs reviewed and stable Respiratory status: spontaneous breathing and respiratory function stable Cardiovascular status: stable Postop Assessment: no apparent nausea or vomiting Anesthetic complications: no Comments: Called cardiologist re: patient's PVC's. Patient has a history of frequent PVC's and is asymptomatic. Patient is followed for paroxysmal afib onPradaxa. Norton Blizzard, MD     No complications documented.  Last Vitals:  Vitals:   02/23/20 1230 02/23/20 1300  BP: (!) 155/100 (!) 129/100  Pulse: 73 78  Resp: (!) 21 19  Temp:  (!) 36.3 C  SpO2: 95% 94%    Last Pain:  Vitals:   02/23/20 1300  TempSrc:   PainSc: 0-No pain                 Merlinda Frederick

## 2020-02-23 NOTE — Transfer of Care (Signed)
Immediate Anesthesia Transfer of Care Note  Patient: Ronald Fleming  Procedure(s) Performed: LAPAROSCOPIC LEFT  INGUINAL HERNIA WITH MESH (Left )  Patient Location: PACU  Anesthesia Type:General  Level of Consciousness: awake  Airway & Oxygen Therapy: Patient Spontanous Breathing  Post-op Assessment: Report given to RN and Post -op Vital signs reviewed and stable  Post vital signs: Reviewed and stable  Last Vitals:  Vitals Value Taken Time  BP    Temp    Pulse 84 02/23/20 1114  Resp 18 02/23/20 1114  SpO2 99 % 02/23/20 1114  Vitals shown include unvalidated device data.  Last Pain:  Vitals:   02/23/20 0905  TempSrc:   PainSc: 0-No pain         Complications: No complications documented.

## 2020-02-24 ENCOUNTER — Encounter (HOSPITAL_COMMUNITY): Payer: Self-pay | Admitting: General Surgery

## 2020-02-24 ENCOUNTER — Ambulatory Visit: Payer: Medicare PPO

## 2020-02-29 ENCOUNTER — Other Ambulatory Visit: Payer: Self-pay

## 2020-02-29 ENCOUNTER — Ambulatory Visit (INDEPENDENT_AMBULATORY_CARE_PROVIDER_SITE_OTHER): Payer: Medicare PPO

## 2020-02-29 VITALS — BP 130/72 | HR 72 | Temp 98.3°F | Resp 16 | Ht 71.0 in | Wt 184.6 lb

## 2020-02-29 DIAGNOSIS — Z0000 Encounter for general adult medical examination without abnormal findings: Secondary | ICD-10-CM | POA: Diagnosis not present

## 2020-02-29 NOTE — Patient Instructions (Signed)
Ronald Fleming , Thank you for taking time to come for your Medicare Wellness Visit. I appreciate your ongoing commitment to your health goals. Please review the following plan we discussed and let me know if I can assist you in the future.   Screening recommendations/referrals: Colonoscopy: no longer required Recommended yearly ophthalmology/optometry visit for glaucoma screening and checkup Recommended yearly dental visit for hygiene and checkup  Vaccinations: Influenza vaccine: done 12/01/19 Pneumococcal vaccine: done 05/25/13 Tdap vaccine: done 03/28/10 Shingles vaccine: Shingrix discussed. Please contact your pharmacy for coverage information.  Covid-19: done 05/14/19 & 06/15/19  Advanced directives: Please bring a copy of your health care power of attorney and living will to the office at your convenience.  Conditions/risks identified: Recommend increasing physical activity to at least 3 days per week   Next appointment: Follow up in one year for your annual wellness visit.   Preventive Care 82 Years and Older, Male Preventive care refers to lifestyle choices and visits with your health care provider that can promote health and wellness. What does preventive care include?  A yearly physical exam. This is also called an annual well check.  Dental exams once or twice a year.  Routine eye exams. Ask your health care provider how often you should have your eyes checked.  Personal lifestyle choices, including:  Daily care of your teeth and gums.  Regular physical activity.  Eating a healthy diet.  Avoiding tobacco and drug use.  Limiting alcohol use.  Practicing safe sex.  Taking low doses of aspirin every day.  Taking vitamin and mineral supplements as recommended by your health care provider. What happens during an annual well check? The services and screenings done by your health care provider during your annual well check will depend on your age, overall health, lifestyle  risk factors, and family history of disease. Counseling  Your health care provider may ask you questions about your:  Alcohol use.  Tobacco use.  Drug use.  Emotional well-being.  Home and relationship well-being.  Sexual activity.  Eating habits.  History of falls.  Memory and ability to understand (cognition).  Work and work Statistician. Screening  You may have the following tests or measurements:  Height, weight, and BMI.  Blood pressure.  Lipid and cholesterol levels. These may be checked every 5 years, or more frequently if you are over 82 years old.  Skin check.  Lung cancer screening. You may have this screening every year starting at age 82 if you have a 30-pack-year history of smoking and currently smoke or have quit within the past 15 years.  Fecal occult blood test (FOBT) of the stool. You may have this test every year starting at age 82.  Flexible sigmoidoscopy or colonoscopy. You may have a sigmoidoscopy every 5 years or a colonoscopy every 10 years starting at age 82.  Prostate cancer screening. Recommendations will vary depending on your family history and other risks.  Hepatitis C blood test.  Hepatitis B blood test.  Sexually transmitted disease (STD) testing.  Diabetes screening. This is done by checking your blood sugar (glucose) after you have not eaten for a while (fasting). You may have this done every 1-3 years.  Abdominal aortic aneurysm (AAA) screening. You may need this if you are a current or former smoker.  Osteoporosis. You may be screened starting at age 82 if you are at high risk. Talk with your health care provider about your test results, treatment options, and if necessary, the need for more  tests. Vaccines  Your health care provider may recommend certain vaccines, such as:  Influenza vaccine. This is recommended every year.  Tetanus, diphtheria, and acellular pertussis (Tdap, Td) vaccine. You may need a Td booster every 10  years.  Zoster vaccine. You may need this after age 82.  Pneumococcal 13-valent conjugate (PCV13) vaccine. One dose is recommended after age 82.  Pneumococcal polysaccharide (PPSV23) vaccine. One dose is recommended after age 82. Talk to your health care provider about which screenings and vaccines you need and how often you need them. This information is not intended to replace advice given to you by your health care provider. Make sure you discuss any questions you have with your health care provider. Document Released: 03/31/2015 Document Revised: 11/22/2015 Document Reviewed: 01/03/2015 Elsevier Interactive Patient Education  2017 [place] Prevention in the Home Falls can cause injuries. They can happen to people of all ages. There are many things you can do to make your home safe and to help prevent falls. What can I do on the outside of my home?  Regularly fix the edges of walkways and driveways and fix any cracks.  Remove anything that might make you trip as you walk through a door, such as a raised step or threshold.  Trim any bushes or trees on the path to your home.  Use bright outdoor lighting.  Clear any walking paths of anything that might make someone trip, such as rocks or tools.  Regularly check to see if handrails are loose or broken. Make sure that both sides of any steps have handrails.  Any raised decks and porches should have guardrails on the edges.  Have any leaves, snow, or ice cleared regularly.  Use sand or salt on walking paths during winter.  Clean up any spills in your garage right away. This includes oil or grease spills. What can I do in the bathroom?  Use night lights.  Install grab bars by the toilet and in the tub and shower. Do not use towel bars as grab bars.  Use non-skid mats or decals in the tub or shower.  If you need to sit down in the shower, use a plastic, non-slip stool.  Keep the floor dry. Clean up any water that  spills on the floor as soon as it happens.  Remove soap buildup in the tub or shower regularly.  Attach bath mats securely with double-sided non-slip rug tape.  Do not have throw rugs and other things on the floor that can make you trip. What can I do in the bedroom?  Use night lights.  Make sure that you have a light by your bed that is easy to reach.  Do not use any sheets or blankets that are too big for your bed. They should not hang down onto the floor.  Have a firm chair that has side arms. You can use this for support while you get dressed.  Do not have throw rugs and other things on the floor that can make you trip. What can I do in the kitchen?  Clean up any spills right away.  Avoid walking on wet floors.  Keep items that you use a lot in easy-to-reach places.  If you need to reach something above you, use a strong step stool that has a grab bar.  Keep electrical cords out of the way.  Do not use floor polish or wax that makes floors slippery. If you must use wax, use non-skid floor  wax.  Do not have throw rugs and other things on the floor that can make you trip. What can I do with my stairs?  Do not leave any items on the stairs.  Make sure that there are handrails on both sides of the stairs and use them. Fix handrails that are broken or loose. Make sure that handrails are as long as the stairways.  Check any carpeting to make sure that it is firmly attached to the stairs. Fix any carpet that is loose or worn.  Avoid having throw rugs at the top or bottom of the stairs. If you do have throw rugs, attach them to the floor with carpet tape.  Make sure that you have a light switch at the top of the stairs and the bottom of the stairs. If you do not have them, ask someone to add them for you. What else can I do to help prevent falls?  Wear shoes that:  Do not have high heels.  Have rubber bottoms.  Are comfortable and fit you well.  Are closed at the  toe. Do not wear sandals.  If you use a stepladder:  Make sure that it is fully opened. Do not climb a closed stepladder.  Make sure that both sides of the stepladder are locked into place.  Ask someone to hold it for you, if possible.  Clearly mark and make sure that you can see:  Any grab bars or handrails.  First and last steps.  Where the edge of each step is.  Use tools that help you move around (mobility aids) if they are needed. These include:  Canes.  Walkers.  Scooters.  Crutches.  Turn on the lights when you go into a dark area. Replace any light bulbs as soon as they burn out.  Set up your furniture so you have a clear path. Avoid moving your furniture around.  If any of your floors are uneven, fix them.  If there are any pets around you, be aware of where they are.  Review your medicines with your doctor. Some medicines can make you feel dizzy. This can increase your chance of falling. Ask your doctor what other things that you can do to help prevent falls. This information is not intended to replace advice given to you by your health care provider. Make sure you discuss any questions you have with your health care provider. Document Released: 12/29/2008 Document Revised: 08/10/2015 Document Reviewed: 04/08/2014 Elsevier Interactive Patient Education  2017 [employer].

## 2020-02-29 NOTE — Progress Notes (Signed)
Subjective:   Ronald Fleming is a 84 y.o. male who presents for Medicare Annual/Subsequent preventive examination.  Review of Systems    Cardiac Risk Factors include: advanced age (>35men, >86 women);dyslipidemia;male gender;hypertension     Objective:    Today's Vitals   02/29/20 0942 02/29/20 0943  BP: 130/72   Pulse: 72   Resp: 16   Temp: 98.3 F (36.8 C)   TempSrc: Oral   SpO2: 99%   Weight: 184 lb 9.6 oz (83.7 kg)   Height: 5\' 11"  (1.803 m)   PainSc:  0-No pain   Body mass index is 25.75 kg/m.  Advanced Directives 02/29/2020 02/23/2020 02/23/2019 02/20/2018 10/28/2017 12/09/2016 09/10/2016  Does Patient Have a Medical Advance Directive? Yes No Yes Yes Yes Yes Yes  Type of Paramedic of [place];Living will - [place];Living will [place];Living will [place];Living will [place];Living will [place];Living will  Does patient want to make changes to medical advance directive? - - - - No - Patient declined - -  Copy of [place] in Chart? No - copy requested - No - copy requested No - copy requested No - copy requested No - copy requested -  Would patient like information on creating a medical advance directive? - No - Patient declined - - - - -    Current Medications (verified) Outpatient Encounter Medications as of 02/29/2020  Medication Sig  . atorvastatin (LIPITOR) 20 MG tablet Take 1 tablet (20 mg total) by mouth daily.  . Cholecalciferol (VITAMIN D) 2000 units CAPS Take 1 capsule (2,000 Units total) by mouth daily.  . dabigatran (PRADAXA) 150 MG CAPS capsule Take 1 capsule (150 mg total) by mouth 2 (two) times daily.  Marland Kitchen losartan (COZAAR) 50 MG tablet Take 1 tablet (50 mg total) by mouth daily.  . tamsulosin (FLOMAX) 0.4 MG CAPS capsule TAKE 1 CAPSULE EVERY DAY (Patient taking differently: Take 0.4 mg by mouth daily.)  . vitamin  B-12 (CYANOCOBALAMIN) 1000 MCG tablet Take 2,000 mcg by mouth 3 (three) times a week.   . [DISCONTINUED] traMADol (ULTRAM) 50 MG tablet Take 1 tablet (50 mg total) by mouth every 6 (six) hours as needed.   No facility-administered encounter medications on file as of 02/29/2020.    Allergies (verified) Patient has no known allergies.   History: Past Medical History:  Diagnosis Date  . Arrhythmia   . Arthritis   . Benign positional vertigo   . BPH (benign prostatic hyperplasia)   . Chronic kidney disease, stage III (moderate) (HCC)   . Deep vein blood clot of right lower extremity ([place])   . Dementia ([place])   . DVT (deep venous thrombosis) ([place])   . Dysrhythmia    ApFib  . Glaucoma   . Hyperlipidemia   . Hypertension   . Nephrolithiasis   . Neuropathy    Past Surgical History:  Procedure Laterality Date  . COLONOSCOPY    . HERNIA REPAIR  11/09/2017  . INGUINAL HERNIA REPAIR Right 11/10/2017   Procedure: LAPAROSCOPIC RIGHT  INGUINAL HERNIA REPAIR;  Surgeon: Ralene Ok, MD;  Location: [facility];  Service: General;  Laterality: Right;  . INGUINAL HERNIA REPAIR Left 02/23/2020   Procedure: LAPAROSCOPIC LEFT  INGUINAL HERNIA WITH MESH;  Surgeon: Ralene Ok, MD;  Location: [facility];  Service: General;  Laterality: Left;  . INSERTION OF MESH Right 11/10/2017   Procedure: INSERTION OF MESH;  Surgeon: Ralene Ok, MD;  Location: [facility] OR;  Service: General;  Laterality: Right;   Family History  Problem Relation Age of Onset  . Coronary artery disease Father   . Stroke Son   . Kidney disease Neg Hx   . Prostate cancer Neg Hx   . Bladder Cancer Neg Hx    Social History   Socioeconomic History  . Marital status: Married    Spouse name: Arlina Robes  . Number of children: 3  . Years of education: Not on file  . Highest education level: 12th grade  Occupational History  . 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 Smoking status: Never Smoker  . Smokeless tobacco: Never Used  Vaping Use  . Vaping Use: Never used  Substance and Sexual Activity  . Alcohol use: Never    Alcohol/week: 0.0 standard drinks  . Drug use: No  . Sexual activity: Not Currently    Partners: Female  Other Topics Concern  . Not on file  Social History Narrative   Married for 50 plus years.    They have two children that live in [place] and one son that lives in [place].   He likes to work in his garden    Social Determinants of Radio broadcast assistant Strain: [place]   . Difficulty of Paying Living Expenses: Not very hard  Food Insecurity: No Food Insecurity  . Worried About Charity fundraiser in the Last Year: Never true  . Ran Out of Food in the Last Year: Never true  Transportation Needs: No Transportation Needs  . Lack of Transportation (Medical): No  . Lack of Transportation (Non-Medical): No  Physical Activity: Inactive  . Days of Exercise per Week: 0 days  . Minutes of Exercise per Session: 0 min  Stress: No Stress Concern Present  . Feeling of Stress : Not at all  Social Connections: Moderately Isolated  . Frequency of Communication with Friends and Family: More than three times a week  . Frequency of Social Gatherings with Friends and Family: More than three times a week  . Attends Religious Services: Never  . Active Member of Clubs or Organizations: No  . Attends Archivist Meetings: Never  . Marital Status: Married    Tobacco Counseling Counseling given: Not Answered   Clinical Intake:  Pre-visit preparation completed: Yes  Pain : No/denies pain Pain Score: 0-No pain     BMI - recorded: 25.75 Nutritional Status: BMI 25 -29 Overweight Nutritional Risks: None Diabetes: No  How often do you need to have someone help you when you read instructions, pamphlets, or other written materials from your doctor or pharmacy?: 1 - Never    Interpreter Needed?: No  Information  entered by :: Clemetine Marker LPN   Activities of Daily Living In your present state of health, do you have any difficulty performing the following activities: 02/29/2020 10/19/2019  Hearing? N N  Comment declines hearing aids -  Vision? N N  Difficulty concentrating or making decisions? N N  Walking or climbing stairs? N N  Dressing or bathing? N N  Doing errands, shopping? N N  Preparing Food and eating ? N -  Using the Toilet? N -  In the past six months, have you accidently leaked urine? N -  Do you have problems with loss of bowel control? N -  Managing your Medications? N -  Managing  your Finances? N -  Housekeeping or managing your Housekeeping? N -  Some recent data might be hidden    Patient Care Team: Steele Sizer, MD as PCP - General (Family Medicine) Yolonda Kida, MD as Consulting Physician (Cardiology) Vladimir Crofts, MD as Consulting Physician (Neurology) Ardis Hughs, MD as Attending Physician (Urology)  Indicate any recent Medical Services you may have received from other than [facility] providers in the past year (date may be approximate).     Assessment:   This is a routine wellness examination for [place].  Hearing/Vision screen  Hearing Screening   125Hz  250Hz  500Hz  1000Hz  2000Hz  3000Hz  4000Hz  6000Hz  8000Hz   Right ear:           Left ear:           Comments: Pt denies hearing difficulty  Vision Screening Comments: [facility] screen[facility]; due for exam   Dietary issues and exercise activities discussed: Current Exercise Habits: The patient does not participate in regular exercise at present, Exercise limited by: None identified  Goals    . Increase physical activity     Increase physical activity to 150 minutes per week      Depression Screen PHQ 2/9 Scores 02/29/2020 10/19/2019 05/28/2019 02/23/2019 01/27/2019 09/14/2018 05/13/2018  PHQ - 2 Score 0 0 0 0 0 0 0  PHQ- 9 Score - 0 0 - 0 0 0    Fall Risk Fall Risk   02/29/2020 10/19/2019 05/28/2019 02/23/2019 01/27/2019  Falls in the past year? 0 0 0 0 0  Number falls in past yr: 0 0 0 0 0  Injury with Fall? 0 0 0 0 0  Risk for fall due to : No Fall Risks - - - -  Follow up Falls prevention discussed - - Falls prevention discussed -    FALL RISK PREVENTION PERTAINING TO THE HOME:  Any stairs in or around the home? Yes  If so, are there any without handrails? No  Home free of loose throw rugs in walkways, pet beds, electrical cords, etc? Yes  Adequate lighting in your home to reduce risk of falls? Yes   ASSISTIVE DEVICES UTILIZED TO PREVENT FALLS:  Life alert? No  Use of a cane, walker or w/c? No  Grab bars in the bathroom? Yes  Shower chair or bench in shower? No  Elevated toilet seat or a handicapped toilet? No   TIMED UP AND GO:  Was the test performed? Yes .  Length of time to ambulate 10 feet: 5 sec.   Gait steady and fast without use of assistive device  Cognitive Function:     6CIT Screen 02/29/2020 02/23/2019 02/20/2018  What Year? 0 points 0 points 0 points  What month? 0 points 0 points 0 points  What time? 0 points 0 points 0 points  Count back from 20 0 points 0 points 0 points  Months in reverse 0 points 0 points 0 points  Repeat phrase 0 points 6 points 0 points  Total Score 0 6 0    Immunizations Immunization History  Administered Date(s) Administered  . Fluad Quad(high Dose 65+) 12/07/2018, 12/01/2019  . Influenza Split 03/25/2008, 12/19/2008, 11/29/2009  . Influenza, High Dose Seasonal PF 04/17/2015, 12/29/2015, 12/09/2016, 12/31/2017  . Influenza, Seasonal, Injecte, Preservative Fre 12/18/2010, 12/06/2011  . Moderna Sars-Covid-2 Vaccination 05/14/2019, 06/15/2019  . Pneumococcal Conjugate-13 05/25/2013  . Pneumococcal Polysaccharide-23 11/29/2009  . Tdap 03/28/2010  . Zoster 11/09/2010     TDAP  status: Up to date  Flu Vaccine status: Up to date  Pneumococcal vaccine status: Up to date  Covid-19 vaccine  status: Completed vaccines  Qualifies for Shingles Vaccine? Yes   Zostavax completed Yes   Shingrix Completed?: No.    Education has been provided regarding the importance of this vaccine. Patient has been advised to call insurance company to determine out of pocket expense if they have not yet received this vaccine. Advised may also receive vaccine at local pharmacy or Health Dept. Verbalized acceptance and understanding.  Screening Tests Health Maintenance  Topic Date Due  . COVID-19 Vaccine (3 - Booster for Moderna series) 12/16/2019  . TETANUS/TDAP  03/28/2020  . INFLUENZA VACCINE  Completed  . PNA vac Low Risk Adult  Completed    Health Maintenance  Health Maintenance Due  Topic Date Due  . COVID-19 Vaccine (3 - Booster for Moderna series) 12/16/2019    Colorectal cancer screening: No longer required.   Lung Cancer Screening: (Low Dose CT Chest recommended if Age 82-80 years, 30 pack-year currently smoking OR have quit w/in 15years.) does not qualify.    Additional Screening:  Hepatitis C Screening: does not qualify  Vision Screening: Recommended annual ophthalmology exams for early detection of glaucoma and other disorders of the eye. Is the patient up to date with their annual eye exam?  No  Who is the provider or what is the name of the office in which the patient attends annual eye exams? [place] Screening: Recommended annual dental exams for proper oral hygiene  Community Resource Referral / Chronic Care Management: CRR required this visit?  No   CCM required this visit?  No      Plan:     I have personally reviewed and noted the following in the patient's chart:   . Medical and social history . Use of alcohol, tobacco or illicit drugs  . Current medications and supplements . Functional ability and status . Nutritional status . Physical activity . Advanced directives . List of other physicians . Hospitalizations, surgeries, and ER  visits in previous 12 months . Vitals . Screenings to include cognitive, depression, and falls . Referrals and appointments  In addition, I have reviewed and discussed with patient certain preventive protocols, quality metrics, and best practice recommendations. A written personalized care plan for preventive services as well as general preventive health recommendations were provided to patient.     Clemetine Marker, LPN   [id number]/[id number]   Nurse Notes: pt accompanied to visit today by his son Johnathan.

## 2020-04-20 NOTE — Progress Notes (Unsigned)
Name: Ronald Fleming   MRN: [id number]    DOB: 1935/08/12   Date:04/21/2020       Progress Note  Subjective  Chief Complaint  Follow Up  HPI  HTN:not checking bp lately.No chest painhe has occasional palpation. He denies dizziness, however son has observed that he staggers when he first gets up to walk. We will decrease dose if losartan 25 mg daily and monitor to see if symptoms improves.   Hyperlipidemia:taking statins, denies side effects,last LDL was down to 65  He also has atherosclerosis of aorta, is on statin therapy, off aspirin, but takes pradaxa.  Alzheimer's late onset: wife noticed he had memory loss, seeing Dr Manuella Ghazi ( Neurologist ) and is now on Razadyne12mg  twice dailye has been on medicationsince Fall 2017,he chose to stop medication. Since wife had brain surgery and one of their son's moved in with them, and Ronald Fleming lives next door and checks on them daily, cooking and helping around the house He is still able to perform ADL on his own, still pays his bills. Son's have access and they monitor what he is doing financially   Pre-diabetes: last hgbA1C was 5.8%  he denies polyphagia, polydipsia, he has BPH and urinary frequency is stable   Afib: doing well, he has SOB when walking to mail box  -son states going on for a while , he has intermittent palpitation, on Pradaxa and denies easy bleedingor bruising.Sees Dr Clayborn Bigness, he has been off aspirin because of risk of bleeding. His platelets was low during last hospital stay, offered rechecking labs today but we will hold off until next visit   BPH: he has urinary frequency during the day,he also has nocturia two to three times daily, but able to fall back asleep ( discussed night light to prevent falls) , he is on Flomax and seesDr.Stoioff.He has a history ofhematuriasecondary to kidney stones.Unchanged   Pancreas cyst: found on CT for stone search, had repeat MRI 10/2018 and repeat was done 10/2019 . He has  good appetite, no nausea, vomiting or abdominal pain  IMPRESSION: 2.4 cm unilocular cystic lesion in the pancreatic body, showing progressive increase in size from 1.3 cm on 2019 exam. EUS/FNA should be considered for further evaluation. This recommendation follows ACR consensus guidelines: Management of Incidental Pancreatic Cysts: A White Paper of the ACR Incidental Findings Committee. [place] [id number].  Discussed with patient and son - explained if it is cancer the treatment would be intense and surgery would probably be too risky because of his age. Patient does not want to do anything about it at this time   Chronic DVT right leg, also has varicose veins, no pain or discomfort at this time, he has intermittent right lower extremity edema that resolves with compression stocking hoses prnUnchanged   Left inguinal hernia: he had a repair done 02/23/2020 , doing well since   Patient Active Problem List   Diagnosis Date Noted  . Thrombocytosis 10/29/2017  . DDD (degenerative disc disease), lumbosacral 09/11/2017  . Nephrolithiasis 09/11/2017  . Inguinal hernia of right side without obstruction or gangrene 09/11/2017  . Vitamin D deficiency 09/11/2017  . Lesion of pancreas 07/09/2017  . Atherosclerosis of aorta ([place]) 07/01/2017  . Osteopenia 05/21/2017  . Atrioventricular block, second degree 05/29/2016  . Vitamin B12 deficiency 05/21/2016  . Late onset Alzheimer's disease without behavioral disturbance ([place]) 05/21/2016  . Vitiligo 02/12/2016  . History of DVT of lower extremity 08/03/2015  . BPH (benign prostatic hyperplasia) 08/03/2015  .  Atrial fibrillation ([place]) 02/01/2015  . Edema leg 08/23/2014  . Neuropathy 08/23/2014  . Benign essential HTN 08/27/2006  . Hypercholesterolemia without hypertriglyceridemia 08/27/2006    Past Surgical History:  Procedure Laterality Date  . COLONOSCOPY    . HERNIA REPAIR  11/09/2017  . INGUINAL HERNIA REPAIR Right  11/10/2017   Procedure: LAPAROSCOPIC RIGHT  INGUINAL HERNIA REPAIR;  Surgeon: Ralene Ok, MD;  Location: [facility];  Service: General;  Laterality: Right;  . INGUINAL HERNIA REPAIR Left 02/23/2020   Procedure: LAPAROSCOPIC LEFT  INGUINAL HERNIA WITH MESH;  Surgeon: Ralene Ok, MD;  Location: [facility];  Service: General;  Laterality: Left;  . INSERTION OF MESH Right 11/10/2017   Procedure: INSERTION OF MESH;  Surgeon: Ralene Ok, MD;  Location: [facility];  Service: General;  Laterality: Right;    Family History  Problem Relation Age of Onset  . Coronary artery disease Father   . Stroke Son   . Kidney disease Neg Hx   . Prostate cancer Neg Hx   . Bladder Cancer Neg Hx     Social History   Tobacco Use  . Smoking status: Never Smoker  . Smokeless tobacco: Never Used  Substance Use Topics  . Alcohol use: Never    Alcohol/week: 0.0 standard drinks     Current Outpatient Medications:  .  atorvastatin (LIPITOR) 20 MG tablet, Take 1 tablet (20 mg total) by mouth daily., Disp: 90 tablet, Rfl: 1 .  Cholecalciferol (VITAMIN D) 2000 units CAPS, Take 1 capsule (2,000 Units total) by mouth daily., Disp: 30 capsule, Rfl: 0 .  dabigatran (PRADAXA) 150 MG CAPS capsule, Take 1 capsule (150 mg total) by mouth 2 (two) times daily., Disp: 180 capsule, Rfl: 1 .  losartan (COZAAR) 50 MG tablet, Take 1 tablet (50 mg total) by mouth daily., Disp: 90 tablet, Rfl: 1 .  tamsulosin (FLOMAX) 0.4 MG CAPS capsule, TAKE 1 CAPSULE EVERY DAY (Patient taking differently: Take 0.4 mg by mouth daily.), Disp: 90 capsule, Rfl: 1 .  vitamin B-12 (CYANOCOBALAMIN) 1000 MCG tablet, Take 2,000 mcg by mouth 3 (three) times a week. , Disp: , Rfl:   No Known Allergies  I personally reviewed active problem list, medication list, allergies, family history, social history, health maintenance with the patient/caregiver today.   ROS  Constitutional: Negative for fever or weight change.  Respiratory: Negative for cough,  positive for mild shortness of breath with activity .   Cardiovascular: Negative for chest pain , but has occasional  palpitations.  Gastrointestinal: Negative for abdominal pain, no bowel changes.  Musculoskeletal: Negative for gait problem or joint swelling.  Skin: Negative for rash.  Neurological: Negative for dizziness or headache.  No other specific complaints in a complete review of systems (except as listed in HPI above).  Objective  Vitals:   04/21/20 1107  BP: 112/74  Pulse: 67  Resp: 16  Temp: 98.5 F (36.9 C)  TempSrc: Oral  SpO2: 95%  Weight: 185 lb 11.2 oz (84.2 kg)  Height: 5\' 11"  (1.803 m)    Body mass index is 25.9 kg/m.  Physical Exam  Constitutional: Patient appears well-developed and well-nourished. No distress.  HEENT: head atraumatic, normocephalic, pupils equal and reactive to light, neck supple Cardiovascular: Normal rate, irregular rhythm and normal heart sounds.  No murmur heard. No BLE edema. Pulmonary/Chest: Effort normal and breath sounds normal. No respiratory distress. Abdominal: Soft.  There is no tenderness. Psychiatric: Patient has a normal mood and affect. behavior is normal. Judgment and thought  content normal.  Recent Results (from the past 2160 hour(s))  SARS CORONAVIRUS 2 (TAT 6-24 HRS) Nasopharyngeal Nasopharyngeal Swab     Status: None   Collection Time: 02/19/20  1:46 PM   Specimen: Nasopharyngeal Swab  Result Value Ref Range   SARS Coronavirus 2 NEGATIVE NEGATIVE    Comment: (NOTE) SARS-CoV-2 target nucleic acids are NOT DETECTED.  The SARS-CoV-2 RNA is generally detectable in upper and lower respiratory specimens during the acute phase of infection. Negative results do not preclude SARS-CoV-2 infection, do not rule out co-infections with other pathogens, and should not be used as the sole basis for treatment or other patient management decisions. Negative results must be combined with clinical observations, patient history,  and epidemiological information. The expected result is Negative.  Fact Sheet for Patients: SugarRoll.be  Fact Sheet for Healthcare Providers: https://www.woods-mathews.com/  This test is not yet approved or cleared by the [place] FDA and  has been authorized for detection and/or diagnosis of SARS-CoV-2 by FDA under an Emergency Use Authorization (EUA). This EUA will remain  in effect (meaning this test can be used) for the duration of the COVID-19 declaration under Se ction 564(b)(1) of the Act, 21 U.S.C. section 360bbb-3(b)(1), unless the authorization is terminated or revoked sooner.  Performed at [facility], [place] [address] [place], [place] [address]   CBC per protocol     Status: Abnormal   Collection Time: 02/23/20  8:49 AM  Result Value Ref Range   WBC 5.2 4.0 - 10.5 K/uL   RBC 4.40 4.22 - 5.81 MIL/uL   Hemoglobin 14.6 13.0 - 17.0 g/dL   HCT 43.3 39.0 - 52.0 %   MCV 98.4 80.0 - 100.0 fL   MCH 33.2 26.0 - 34.0 pg   MCHC 33.7 30.0 - 36.0 g/dL   RDW 12.1 11.5 - 15.5 %   Platelets 128 (L) 150 - 400 K/uL    Comment: REPEATED TO VERIFY   nRBC 0.0 0.0 - 0.2 %    Comment: Performed at [facility], [place] [address] [place], [place] [address]  Basic metabolic panel per protocol     Status: Abnormal   Collection Time: 02/23/20  8:49 AM  Result Value Ref Range   Sodium 140 135 - 145 mmol/L   Potassium 3.8 3.5 - 5.1 mmol/L   Chloride 107 98 - 111 mmol/L   CO2 24 22 - 32 mmol/L   Glucose, Bld 88 70 - 99 mg/dL    Comment: Glucose reference range applies only to samples taken after fasting for at least 8 hours.   BUN 21 8 - 23 mg/dL   Creatinine, Ser 1.30 (H) 0.61 - 1.24 mg/dL   Calcium 9.0 8.9 - 10.3 mg/dL   GFR, Estimated 55 (L) >60 mL/min    Comment: (NOTE) Calculated using the CKD-EPI Creatinine Equation (2021)    Anion gap 9 5 - 15    Comment: Performed at [facility] [address], [place],   [address]      PHQ2/9: Depression screen [facility] 2/9 04/21/2020 02/29/2020 10/19/2019 05/28/2019 02/23/2019  Decreased Interest 0 0 0 0 0  Down, Depressed, Hopeless 0 0 0 0 0  PHQ - 2 Score 0 0 0 0 0  Altered sleeping - - 0 0 -  Tired, decreased energy - - 0 0 -  Change in appetite - - 0 0 -  Feeling bad or failure about yourself  - - 0 0 -  Trouble concentrating - - 0 0 -  Moving slowly or fidgety/restless - - 0 0 -  Suicidal thoughts - - 0 0 -  PHQ-9 Score - - 0 0 -  Difficult doing work/chores - - - Not difficult at all -  Some recent data might be hidden    phq 9 is negative   Fall Risk: Fall Risk  04/21/2020 02/29/2020 10/19/2019 05/28/2019 02/23/2019  Falls in the past year? 0 0 0 0 0  Number falls in past yr: 0 0 0 0 0  Injury with Fall? 0 0 0 0 0  Risk for fall due to : - No Fall Risks - - -  Follow up - Falls prevention discussed - - Falls prevention discussed     Functional Status Survey: Is the patient deaf or have difficulty hearing?: Yes Does the patient have difficulty seeing, even when wearing glasses/contacts?: No Does the patient have difficulty concentrating, remembering, or making decisions?: No Does the patient have difficulty walking or climbing stairs?: Yes Does the patient have difficulty dressing or bathing?: No Does the patient have difficulty doing errands alone such as visiting a doctor's office or shopping?: No    Assessment & Plan  1. Benign essential HTN  - losartan (COZAAR) 25 MG tablet; Take 1 tablet (25 mg total) by mouth daily.  Dispense: 90 tablet; Refill: 1  2. Hypercholesterolemia without hypertriglyceridemia  - atorvastatin (LIPITOR) 20 MG tablet; Take 1 tablet (20 mg total) by mouth daily.  Dispense: 90 tablet; Refill: 1  3. Paroxysmal atrial fibrillation (HCC)  - dabigatran (PRADAXA) 150 MG CAPS capsule; Take 1 capsule (150 mg total) by mouth 2 (two) times daily.  Dispense: 180 capsule; Refill: 1  4. Thrombocytopenia ([place])   5.  Vitamin B12 deficiency  Take supplementation a few times a week  6. Chronic kidney disease, stage 2 (mild)   7. Pre-diabetes   8. Vitamin D deficiency   9. Benign prostatic hyperplasia (BPH) with urinary urgency  Keep follow up with urologist   10. Chronic deep vein thrombosis (DVT) of right femoral vein (HCC)   11. Atherosclerosis of aorta ([place])  On statin therapy   12. Late onset Alzheimer's disease without behavioral disturbance (HCC)  Refused medication   13. Pancreatic cyst  - Lipase

## 2020-04-21 ENCOUNTER — Ambulatory Visit: Payer: Medicare PPO | Admitting: Family Medicine

## 2020-04-21 ENCOUNTER — Encounter: Payer: Self-pay | Admitting: Family Medicine

## 2020-04-21 ENCOUNTER — Other Ambulatory Visit: Payer: Self-pay

## 2020-04-21 VITALS — BP 112/74 | HR 67 | Temp 98.5°F | Resp 16 | Ht 71.0 in | Wt 185.7 lb

## 2020-04-21 DIAGNOSIS — N401 Enlarged prostate with lower urinary tract symptoms: Secondary | ICD-10-CM | POA: Diagnosis not present

## 2020-04-21 DIAGNOSIS — R7303 Prediabetes: Secondary | ICD-10-CM | POA: Diagnosis not present

## 2020-04-21 DIAGNOSIS — K862 Cyst of pancreas: Secondary | ICD-10-CM | POA: Diagnosis not present

## 2020-04-21 DIAGNOSIS — E538 Deficiency of other specified B group vitamins: Secondary | ICD-10-CM

## 2020-04-21 DIAGNOSIS — E7800 Pure hypercholesterolemia, unspecified: Secondary | ICD-10-CM | POA: Diagnosis not present

## 2020-04-21 DIAGNOSIS — I480 Paroxysmal atrial fibrillation: Secondary | ICD-10-CM

## 2020-04-21 DIAGNOSIS — N182 Chronic kidney disease, stage 2 (mild): Secondary | ICD-10-CM

## 2020-04-21 DIAGNOSIS — E559 Vitamin D deficiency, unspecified: Secondary | ICD-10-CM | POA: Diagnosis not present

## 2020-04-21 DIAGNOSIS — I10 Essential (primary) hypertension: Secondary | ICD-10-CM | POA: Diagnosis not present

## 2020-04-21 DIAGNOSIS — I82511 Chronic embolism and thrombosis of right femoral vein: Secondary | ICD-10-CM

## 2020-04-21 DIAGNOSIS — D696 Thrombocytopenia, unspecified: Secondary | ICD-10-CM | POA: Diagnosis not present

## 2020-04-21 DIAGNOSIS — G301 Alzheimer's disease with late onset: Secondary | ICD-10-CM

## 2020-04-21 DIAGNOSIS — I700 Atherosclerosis of aorta: Secondary | ICD-10-CM

## 2020-04-21 DIAGNOSIS — R3915 Urgency of urination: Secondary | ICD-10-CM

## 2020-04-21 DIAGNOSIS — F0280 Dementia in other diseases classified elsewhere without behavioral disturbance: Secondary | ICD-10-CM

## 2020-04-21 MED ORDER — LOSARTAN POTASSIUM 25 MG PO TABS: 25.0000 mg | ORAL_TABLET | Freq: Every day | ORAL | 1 refills | Status: DC

## 2020-04-21 MED ORDER — DABIGATRAN ETEXILATE MESYLATE 150 MG PO CAPS: 150.0000 mg | ORAL_CAPSULE | Freq: Two times a day (BID) | ORAL | 1 refills | Status: DC

## 2020-04-21 MED ORDER — ATORVASTATIN CALCIUM 20 MG PO TABS: 20.0000 mg | ORAL_TABLET | Freq: Every day | ORAL | 1 refills | Status: DC

## 2020-04-21 MED ORDER — TAMSULOSIN HCL 0.4 MG PO CAPS
0.4000 mg | ORAL_CAPSULE | Freq: Every day | ORAL | 1 refills | Status: DC
Start: 2020-04-21 — End: 2020-11-22

## 2020-04-21 NOTE — Addendum Note (Signed)
Addended by: Steele Sizer F on: 04/21/2020 11:59 AM   Modules accepted: Orders

## 2020-04-22 LAB — COMPLETE METABOLIC PANEL WITH GFR
AG Ratio: 1.5 (calc) (ref 1.0–2.5)
ALT: 14 U/L (ref 9–46)
AST: 20 U/L (ref 10–35)
Albumin: 4 g/dL (ref 3.6–5.1)
Alkaline phosphatase (APISO): 81 U/L (ref 35–144)
BUN/Creatinine Ratio: 15 (calc) (ref 6–22)
BUN: 19 mg/dL (ref 7–25)
CO2: 30 mmol/L (ref 20–32)
Calcium: 9.2 mg/dL (ref 8.6–10.3)
Chloride: 108 mmol/L (ref 98–110)
Creat: 1.31 mg/dL — ABNORMAL HIGH (ref 0.70–1.11)
GFR, Est African American: 58 mL/min/{1.73_m2} — ABNORMAL LOW (ref >=60)
GFR, Est Non African American: 50 mL/min/{1.73_m2} — ABNORMAL LOW (ref >=60)
Globulin: 2.7 g/dL (calc) (ref 1.9–3.7)
Glucose, Bld: 85 mg/dL (ref 65–99)
Potassium: 4.2 mmol/L (ref 3.5–5.3)
Sodium: 145 mmol/L (ref 135–146)
Total Bilirubin: 0.7 mg/dL (ref 0.2–1.2)
Total Protein: 6.7 g/dL (ref 6.1–8.1)

## 2020-04-22 LAB — CBC WITH DIFFERENTIAL/PLATELET
Absolute Monocytes: 516 cells/uL (ref 200–950)
Basophils Absolute: 18 cells/uL (ref 0–200)
Basophils Relative: 0.3 %
Eosinophils Absolute: 60 cells/uL (ref 15–500)
Eosinophils Relative: 1 %
HCT: 42.4 % (ref 38.5–50.0)
Hemoglobin: 13.9 g/dL (ref 13.2–17.1)
Lymphs Abs: 1056 cells/uL (ref 850–3900)
MCH: 31.9 pg (ref 27.0–33.0)
MCHC: 32.8 g/dL (ref 32.0–36.0)
MCV: 97.2 fL (ref 80.0–100.0)
MPV: 11.5 fL (ref 7.5–12.5)
Monocytes Relative: 8.6 %
Neutro Abs: 4350 cells/uL (ref 1500–7800)
Neutrophils Relative %: 72.5 %
Platelets: 167 10*3/uL (ref 140–400)
RBC: 4.36 10*6/uL (ref 4.20–5.80)
RDW: 11.5 % (ref 11.0–15.0)
Total Lymphocyte: 17.6 %
WBC: 6 10*3/uL (ref 3.8–10.8)

## 2020-04-22 LAB — LIPASE: Lipase: 16 U/L (ref 7–60)

## 2020-05-17 DIAGNOSIS — G309 Alzheimer's disease, unspecified: Secondary | ICD-10-CM | POA: Diagnosis not present

## 2020-05-17 DIAGNOSIS — I480 Paroxysmal atrial fibrillation: Secondary | ICD-10-CM | POA: Diagnosis not present

## 2020-05-17 DIAGNOSIS — I10 Essential (primary) hypertension: Secondary | ICD-10-CM | POA: Diagnosis not present

## 2020-05-17 DIAGNOSIS — R42 Dizziness and giddiness: Secondary | ICD-10-CM | POA: Diagnosis not present

## 2020-05-17 DIAGNOSIS — F0280 Dementia in other diseases classified elsewhere without behavioral disturbance: Secondary | ICD-10-CM | POA: Diagnosis not present

## 2020-05-17 DIAGNOSIS — E7800 Pure hypercholesterolemia, unspecified: Secondary | ICD-10-CM | POA: Diagnosis not present

## 2020-05-17 DIAGNOSIS — I208 Other forms of angina pectoris: Secondary | ICD-10-CM | POA: Diagnosis not present

## 2020-05-17 DIAGNOSIS — Z86718 Personal history of other venous thrombosis and embolism: Secondary | ICD-10-CM | POA: Diagnosis not present

## 2020-05-17 DIAGNOSIS — R001 Bradycardia, unspecified: Secondary | ICD-10-CM | POA: Diagnosis not present

## 2020-05-24 DIAGNOSIS — Z86718 Personal history of other venous thrombosis and embolism: Secondary | ICD-10-CM | POA: Diagnosis not present

## 2020-05-24 DIAGNOSIS — I208 Other forms of angina pectoris: Secondary | ICD-10-CM | POA: Diagnosis not present

## 2020-05-24 DIAGNOSIS — F0280 Dementia in other diseases classified elsewhere without behavioral disturbance: Secondary | ICD-10-CM | POA: Diagnosis not present

## 2020-05-24 DIAGNOSIS — R42 Dizziness and giddiness: Secondary | ICD-10-CM | POA: Diagnosis not present

## 2020-05-24 DIAGNOSIS — E7800 Pure hypercholesterolemia, unspecified: Secondary | ICD-10-CM | POA: Diagnosis not present

## 2020-05-24 DIAGNOSIS — I10 Essential (primary) hypertension: Secondary | ICD-10-CM | POA: Diagnosis not present

## 2020-05-24 DIAGNOSIS — I480 Paroxysmal atrial fibrillation: Secondary | ICD-10-CM | POA: Diagnosis not present

## 2020-05-24 DIAGNOSIS — R001 Bradycardia, unspecified: Secondary | ICD-10-CM | POA: Diagnosis not present

## 2020-05-24 DIAGNOSIS — G309 Alzheimer's disease, unspecified: Secondary | ICD-10-CM | POA: Diagnosis not present

## 2020-06-21 ENCOUNTER — Ambulatory Visit: Payer: Medicare PPO | Admitting: Cardiology

## 2020-06-21 ENCOUNTER — Other Ambulatory Visit: Payer: Self-pay

## 2020-06-21 ENCOUNTER — Encounter: Payer: Self-pay | Admitting: Cardiology

## 2020-06-21 VITALS — BP 122/76 | HR 93 | Ht 71.0 in | Wt 186.6 lb

## 2020-06-21 DIAGNOSIS — R001 Bradycardia, unspecified: Secondary | ICD-10-CM | POA: Diagnosis not present

## 2020-06-21 DIAGNOSIS — I10 Essential (primary) hypertension: Secondary | ICD-10-CM

## 2020-06-21 DIAGNOSIS — I4891 Unspecified atrial fibrillation: Secondary | ICD-10-CM | POA: Diagnosis not present

## 2020-06-21 NOTE — Progress Notes (Signed)
Electrophysiology Office Note:    Date:  06/21/2020   ID:  Ronald Fleming, DOB Nov 28, 1935, MRN [id number]  PCP:  Ronald Sizer, MD  [facility] Cardiologist:  No primary care provider on file.  Ronald Fleming:  Ronald Epley, MD   Referring MD: Ronald Sizer, MD   Chief Complaint: Sick sinus syndrome  History of Present Illness:    Ronald Fleming is a 85 y.o. male who presents for an evaluation of sick sinus syndrome at the request of Dr. Ancil Fleming.  The patient has previously been seen by Dr. Clayborn Fleming with [place] [facility].  He was last seen by Ronald Fleming in the clinic on May 17, 2020.  He has a history of atrial fibrillation on Pradaxa for stroke prophylaxis.  He has a long history of bradycardia.  It looks like he was also seen by Dr. Clayborn Fleming May 24, 2020.  Ronald Fleming is in clinic today with his wife and son.  Ronald Fleming is very active.  He is able to walk seemingly long distances without limitation.  He is able to shop grocery store without any lightheadedness or dizziness or limitation.  Until recently he was mowing his grass but now one of his sons takes care of that for him.  He has a remote syncopal episode that occurred about 3 years ago according to his family.  This was in the middle of the night when he got up to use the restroom.  He has had no other presyncopal or syncopal episodes.  Past Medical History:  Diagnosis Date  . Arrhythmia   . Arthritis   . Benign positional vertigo   . BPH (benign prostatic hyperplasia)   . Chronic kidney disease, stage III (moderate) (HCC)   . Deep vein blood clot of right lower extremity ([place])   . Dementia ([place])   . DVT (deep venous thrombosis) ([place])   . Dysrhythmia    ApFib  . Glaucoma   . Hyperlipidemia   . Hypertension   . Nephrolithiasis   . Neuropathy     Past Surgical History:  Procedure Laterality Date  . COLONOSCOPY    . HERNIA REPAIR  11/09/2017  . INGUINAL HERNIA REPAIR Right 11/10/2017    Procedure: LAPAROSCOPIC RIGHT  INGUINAL HERNIA REPAIR;  Surgeon: Ronald Ok, MD;  Location: [place];  Service: General;  Laterality: Right;  . INGUINAL HERNIA REPAIR Left 02/23/2020   Procedure: LAPAROSCOPIC LEFT  INGUINAL HERNIA WITH MESH;  Surgeon: Ronald Ok, MD;  Location: [place];  Service: General;  Laterality: Left;  . INSERTION OF MESH Right 11/10/2017   Procedure: INSERTION OF MESH;  Surgeon: Ronald Ok, MD;  Location: [place];  Service: General;  Laterality: Right;    Current Medications: Current Meds  Medication Sig  . atorvastatin (LIPITOR) 20 MG tablet Take 1 tablet (20 mg total) by mouth daily.  . Cholecalciferol (VITAMIN D) 2000 units CAPS Take 1 capsule (2,000 Units total) by mouth daily.  . dabigatran (PRADAXA) 150 MG CAPS capsule Take 1 capsule (150 mg total) by mouth 2 (two) times daily.  Marland Kitchen losartan (COZAAR) 25 MG tablet Take 1 tablet (25 mg total) by mouth daily.  . tamsulosin (FLOMAX) 0.4 MG CAPS capsule Take 1 capsule (0.4 mg total) by mouth daily.  . vitamin B-12 (CYANOCOBALAMIN) 1000 MCG tablet Take 2,000 mcg by mouth 3 (three) times a week.      Allergies:   Patient has no known allergies.   Social History   Socioeconomic History  .  Marital status: Married    Spouse name: Ronald Fleming  . Number of children: 3  . Years of education: Not on file  . Highest education level: 12th grade  Occupational History  . 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 Smoking status: Never Smoker  . Smokeless tobacco: Never Used  Vaping Use  . Vaping Use: Never used  Substance and Sexual Activity  . Alcohol use: Never    Alcohol/week: 0.0 standard drinks  . Drug use: No  . Sexual activity: Not Currently    Partners: Female  Other Topics Concern  . Not on file  Social History Narrative   Married for 50 plus years.    They have two children that live in [place] and one son that lives in [place].    He Fleming to work in his garden    Social Determinants of Radio broadcast assistant Strain: [place]   . Difficulty of Paying Living Expenses: Not very hard  Food Insecurity: No Food Insecurity  . Worried About Charity fundraiser in the Last Year: Never true  . Ran Out of Food in the Last Year: Never true  Transportation Needs: No Transportation Needs  . Lack of Transportation (Medical): No  . Lack of Transportation (Non-Medical): No  Physical Activity: Inactive  . Days of Exercise per Week: 0 days  . Minutes of Exercise per Session: 0 min  Stress: No Stress Concern Present  . Feeling of Stress : Not at all  Social Connections: Moderately Isolated  . Frequency of Communication with Friends and Family: More than three times a week  . Frequency of Social Gatherings with Friends and Family: More than three times a week  . Attends Religious Services: Never  . Active Member of Clubs or Organizations: No  . Attends Archivist Meetings: Never  . Marital Status: Married     Family History: The patient's family history includes Coronary artery disease in his father; Stroke in his son. There is no history of Kidney disease, Prostate cancer, or Bladder Cancer.  ROS:   Please see the history of present illness.    All other systems reviewed and are negative.  EKGs/Labs/Other Studies Reviewed:    The following studies were reviewed today:  [facility] clinic records  May 17, 2020 EKG reported as sinus bradycardia without PVCs  September 2020 EKG reported as sinus rhythm with frequent PVCs  EKGs from August 2009, August 2019, December 2021 reviewed.  No evidence of AV conduction disease.  Frequent PVCs on the more recent EKGs.  August 2018 SPECT report shows no evidence of stress-induced myocardial ischemia.  EF estimated at 45 to 50%.  October 30, 2016 echo at [facility] (report only) EF 45 to 50%.  Normal right ventricular function.  EKG:  The ekg ordered today  demonstrates sinus rhythm with frequent monomorphic PVCs originating from the outflow tract (left > right).  Recent Labs: 04/21/2020: ALT 14; BUN 19; Creat 1.31; Hemoglobin 13.9; Platelets 167; Potassium 4.2; Sodium 145  Recent Lipid Panel    Component Value Date/Time   CHOL 146 10/19/2019 1140   CHOL 168 02/01/2015 1114   TRIG 74 10/19/2019 1140   HDL 66 10/19/2019 1140   HDL 65 02/01/2015 1114   CHOLHDL 2.2 10/19/2019 1140   VLDL 17 09/10/2016 1412   LDLCALC 65 10/19/2019 1140    Physical Exam:    VS:  BP 122/76  Pulse 93   Ht 5\' 11"  (1.803 m)   Wt 186 lb 9.6 oz (84.6 kg)   SpO2 96%   BMI 26.03 kg/m     Wt Readings from Last 3 Encounters:  06/21/20 186 lb 9.6 oz (84.6 kg)  04/21/20 185 lb 11.2 oz (84.2 kg)  02/29/20 184 lb 9.6 oz (83.7 kg)     GEN:  Well nourished, well developed in no acute distress.  Appears younger than stated age.  Fit appearing. HEENT: Normal NECK: No JVD; No carotid bruits LYMPHATICS: No lymphadenopathy CARDIAC: RRR, no murmurs, rubs, gallops RESPIRATORY:  Clear to auscultation without rales, wheezing or rhonchi  ABDOMEN: Soft, non-tender, non-distended MUSCULOSKELETAL:  No edema; No deformity  SKIN: Warm and dry NEUROLOGIC:  Alert and oriented x 3 PSYCHIATRIC:  Normal affect   ASSESSMENT:    1. Atrial fibrillation, unspecified type ([place])   2. Benign essential HTN   3. Bradycardia    PLAN:    In order of problems listed above:  1. Atrial fibrillation, paroxysmal On Pradaxa for stroke prophylaxis.  Followed by Dr. Clayborn Fleming at [place] [facility]. Seems like he is maintaining sinus rhythm the vast majority of the time.  2.  Bradycardia Patient has a history of intermittent bradycardia.  There may be an element of tachycardia-bradycardia syndrome given his history of atrial fibrillation.  Today's EKG does not show evidence of AV conduction disease.  The QRS is narrow and the PR interval is normal.  I spent a lot of time during today's  appointment discussing with the patient his family's activity level.  It seems that he is quite active for an 85 year old.  He has no syncope history that seems to relate to his bradycardia.  He had a remote episode of syncope 3 years ago but this seems to be more orthostatic in nature.  We discussed pacemaker implant and the typical indications for pacemaker.  I discussed that my concern at this time is that we could implant a pacemaker but there is no guarantee that it would change how he feels after the implant.  I do think given his history of bradycardia there is a good chance that as he ages he may require permanent pacemaker.  At this point, I do not think a pacemaker is indicated but would continue careful monitoring with at least every 43-month appointments to assess his EKG and for any evidence of symptoms attributable to symptomatic bradycardia.  I spent a lot of time talking with the patient and his family about the warning signs that would require more urgent evaluation including presyncope or syncope.  I would recommend a 2-week ZIO monitor be applied at the next follow-up appointment with [facility].  3.  Frequent PVCs From reviewing his records it seems that his PVC burden waxes and wanes.  Options for pharmacologic suppression are limited because of his history of bradycardia.  4.  Nonischemic cardiomyopathy EF 45 to 50%.  NYHA class I symptoms.  Warm and dry on exam.  On losartan.  Unable to start beta-blocker due to history of bradycardia.  I discussed this case with Dr. Clayborn Fleming from [place] clinic.  Follow-up with me as needed.  Patient has scheduled follow-up with Dr. Clayborn Fleming.  Medication Adjustments/Labs and Tests Ordered: Current medicines are reviewed at length with the patient today.  Concerns regarding medicines are outlined above.  Orders Placed This Encounter  Procedures  . EKG 12-Lead   No orders of the defined types were placed in this  encounter.  Signed, Lars Mage, MD, [facility]  06/21/2020 1:01 PM    Electrophysiology [facility]

## 2020-06-21 NOTE — Patient Instructions (Signed)
Medication Instructions:  Your physician recommends that you continue on your current medications as directed. Please refer to the Current Medication list given to you today.  *If you need a refill on your cardiac medications before your next appointment, please call your pharmacy*   Lab Work: None ordered.  If you have labs (blood work) drawn today and your tests are completely normal, you will receive your results only by: Marland Kitchen MyChart Message (if you have MyChart) OR . A paper copy in the mail If you have any lab test that is abnormal or we need to change your treatment, we will call you to review the results.   Testing/Procedures: None ordered.    Follow-Up: At [facility], you and your health needs are our priority.  As part of our continuing mission to provide you with exceptional heart care, we have created designated Provider Care Teams.  These Care Teams include your primary Cardiologist (physician) and Advanced Practice Providers (APPs -  Physician Assistants and Nurse Practitioners) who all work together to provide you with the care you need, when you need it.  We recommend signing up for the patient portal called "MyChart".  Sign up information is provided on this After Visit Summary.  MyChart is used to connect with patients for Virtual Visits (Telemedicine).  Patients are able to view lab/test results, encounter notes, upcoming appointments, etc.  Non-urgent messages can be sent to your provider as well.   To learn more about what you can do with MyChart, go to [email or web address].    Your next appointment:   Follow up as needed with Dr Quentin Ore.

## 2020-07-12 DIAGNOSIS — E7800 Pure hypercholesterolemia, unspecified: Secondary | ICD-10-CM | POA: Diagnosis not present

## 2020-07-12 DIAGNOSIS — R001 Bradycardia, unspecified: Secondary | ICD-10-CM | POA: Diagnosis not present

## 2020-07-12 DIAGNOSIS — I10 Essential (primary) hypertension: Secondary | ICD-10-CM | POA: Diagnosis not present

## 2020-07-12 DIAGNOSIS — Z86718 Personal history of other venous thrombosis and embolism: Secondary | ICD-10-CM | POA: Diagnosis not present

## 2020-07-12 DIAGNOSIS — R011 Cardiac murmur, unspecified: Secondary | ICD-10-CM | POA: Diagnosis not present

## 2020-07-12 DIAGNOSIS — R0602 Shortness of breath: Secondary | ICD-10-CM | POA: Diagnosis not present

## 2020-07-12 DIAGNOSIS — E785 Hyperlipidemia, unspecified: Secondary | ICD-10-CM | POA: Diagnosis not present

## 2020-07-12 DIAGNOSIS — I480 Paroxysmal atrial fibrillation: Secondary | ICD-10-CM | POA: Diagnosis not present

## 2020-07-12 DIAGNOSIS — I495 Sick sinus syndrome: Secondary | ICD-10-CM | POA: Diagnosis not present

## 2020-10-19 NOTE — Progress Notes (Signed)
Name: Ronald Fleming   MRN: [id number]    DOB: 18-Sep-1935   Date:10/20/2020       Progress Note  Subjective  Chief Complaint  Follow Up  HPI  HTN: No chest pain he has occasional palpation. He denies dizziness, we went down on losartan from 50-25 mg daily and not staggering anymore .   Hyperlipidemia: taking statins, denies side effects, last LDL was down to 65  He also has atherosclerosis of aorta, is on statin therapy , off aspirin, but takes pradaxa. We will recheck labs today    Alzheimer's late onset: wife noticed he had memory loss, seeing Dr Manuella Ghazi ( Neurologist ) and is now on Razadyne '12mg'$  twice daily e has been on medication since Fall 2017, he chose to stop medication. Since wife had brain surgery and one of their son's moved in with them, and Cardell lives next door and checks on them daily, cooking and helping around the house , he states lately he has been doing more cookin. He is still able to perform ADL on his own, still pays his bills. Son's have access and they monitor what he is doing financially . Unchanged    Pre-diabetes: last hgbA1C was 5.8%  he denies polyphagia, polydipsia, he has BPH and urinary frequency is stable Unchanged    Afib: he states doing well, occasional palpitation and SOB with activity, he is on Pradaxa and denies easy bleeding or bruising. Sees Dr Clayborn Bigness, he has been off aspirin because of risk of bleeding. His platelets was low during last hospital stay, but normalized and we will recheck today    BPH: he has urinary frequency during the day,  he also has nocturia two to three times daily, but able to fall back asleep ( discussed night light to prevent falls) , he is on Flomax and sees Dr. Bernardo Heater. He has a history of hematuria secondary to kidney stones but no recent episodes.    Pancreas cyst: found on CT for stone search, had repeat MRI 10/2018 and repeat was done 10/2019 . He has good appetite, no nausea, vomiting or abdominal pain. Unchanged    IMPRESSION: 2.4 cm unilocular cystic lesion in the pancreatic body, showing progressive increase in size from 1.3 cm on 2019 exam. EUS/FNA should be considered for further evaluation. This recommendation follows ACR consensus guidelines: Management of Incidental Pancreatic Cysts: A White Paper of the ACR Incidental Findings Committee. [place] [id number].   Discussed with patient and son during his last visit  - explained if it is cancer the treatment would be intense and surgery would probably be too risky because of his age. Patient does not want to do anything about it at this time    Chronic DVT right leg, also has varicose veins, no pain or discomfort at this time, he has intermittent right lower extremity edema that resolves with compression stocking hoses . No swelling at this time    Patient Active Problem List   Diagnosis Date Noted   Thrombocytopenia ([place]) 04/21/2020   Thrombocytosis 10/29/2017   DDD (degenerative disc disease), lumbosacral 09/11/2017   Nephrolithiasis 09/11/2017   Inguinal hernia of right side without obstruction or gangrene 09/11/2017   Vitamin D deficiency 09/11/2017   Lesion of pancreas 07/09/2017   Atherosclerosis of aorta ([place]) 07/01/2017   Osteopenia 05/21/2017   Atrioventricular block, second degree 05/29/2016   Vitamin B12 deficiency 05/21/2016   Late onset Alzheimer's disease without behavioral disturbance ([place]) 05/21/2016   Vitiligo 02/12/2016  History of DVT of lower extremity 08/03/2015   BPH (benign prostatic hyperplasia) 08/03/2015   Atrial fibrillation ([place]) 02/01/2015   Edema leg 08/23/2014   Neuropathy 08/23/2014   Benign essential HTN 08/27/2006   Hypercholesterolemia without hypertriglyceridemia 08/27/2006    Past Surgical History:  Procedure Laterality Date   COLONOSCOPY     HERNIA REPAIR  11/09/2017   INGUINAL HERNIA REPAIR Right 11/10/2017   Procedure: LAPAROSCOPIC RIGHT  INGUINAL HERNIA REPAIR;  Surgeon:  Ralene Ok, MD;  Location: [place];  Service: General;  Laterality: Right;   INGUINAL HERNIA REPAIR Left 02/23/2020   Procedure: LAPAROSCOPIC LEFT  INGUINAL HERNIA WITH MESH;  Surgeon: Ralene Ok, MD;  Location: [place];  Service: General;  Laterality: Left;   INSERTION OF MESH Right 11/10/2017   Procedure: INSERTION OF MESH;  Surgeon: Ralene Ok, MD;  Location: [place];  Service: General;  Laterality: Right;    Family History  Problem Relation Age of Onset   Coronary artery disease Father    Stroke Son    Kidney disease Neg Hx    Prostate cancer Neg Hx    Bladder Cancer Neg Hx     Social History   Tobacco Use   Smoking status: Never   Smokeless tobacco: Never  Substance Use Topics   Alcohol use: Never    Alcohol/week: 0.0 standard drinks     Current Outpatient Medications:    atorvastatin (LIPITOR) 20 MG tablet, Take 1 tablet (20 mg total) by mouth daily., Disp: 90 tablet, Rfl: 1   dabigatran (PRADAXA) 150 MG CAPS capsule, Take 1 capsule (150 mg total) by mouth 2 (two) times daily., Disp: 180 capsule, Rfl: 1   losartan (COZAAR) 25 MG tablet, Take 1 tablet (25 mg total) by mouth daily., Disp: 90 tablet, Rfl: 1   tamsulosin (FLOMAX) 0.4 MG CAPS capsule, Take 1 capsule (0.4 mg total) by mouth daily., Disp: 90 capsule, Rfl: 1   vitamin B-12 (CYANOCOBALAMIN) 1000 MCG tablet, Take 2,000 mcg by mouth 3 (three) times a week. , Disp: , Rfl:   No Known Allergies  I personally reviewed active problem list, medication list, allergies, family history, social history, health maintenance with the patient/caregiver today.   ROS  Constitutional: Negative for fever or weight change.  Respiratory: Negative for cough and shortness of breath.   Cardiovascular: Negative for chest pain or palpitations.  Gastrointestinal: Negative for abdominal pain, no bowel changes.  Musculoskeletal: Negative for gait problem or joint swelling.  Skin: Negative for rash.  Neurological: Negative for  dizziness or headache.  No other specific complaints in a complete review of systems (except as listed in HPI above).    Objective  Vitals:   10/20/20 1102  BP: 132/74  Pulse: 92  Resp: 16  Temp: 98.4 F (36.9 C)  SpO2: 95%  Weight: 185 lb (83.9 kg)  Height: '5\' 11"'$  (1.803 m)    Body mass index is 25.8 kg/m.  Physical Exam  Constitutional: Patient appears well-developed and well-nourished.  No distress.  HEENT: head atraumatic, normocephalic, pupils equal and reactive to light, neck supple Cardiovascular: Normal rate, irregular rhythm and normal heart sounds.  No murmur heard. No BLE edema. No calf tenderness  Pulmonary/Chest: Effort normal and breath sounds normal. No respiratory distress. Abdominal: Soft.  There is no tenderness. Psychiatric: Patient has a normal mood and affect. behavior is normal. Judgment and thought content normal.    PHQ2/9: Depression screen [facility] 2/9 10/20/2020 04/21/2020 02/29/2020 10/19/2019 05/28/2019  Decreased Interest 0 0  0 0 0  Down, Depressed, Hopeless 0 0 0 0 0  PHQ - 2 Score 0 0 0 0 0  Altered sleeping - - - 0 0  Tired, decreased energy - - - 0 0  Change in appetite - - - 0 0  Feeling bad or failure about yourself  - - - 0 0  Trouble concentrating - - - 0 0  Moving slowly or fidgety/restless - - - 0 0  Suicidal thoughts - - - 0 0  PHQ-9 Score - - - 0 0  Difficult doing work/chores - - - - Not difficult at all  Some recent data might be hidden    phq 9 is negative   Fall Risk: Fall Risk  10/20/2020 04/21/2020 02/29/2020 10/19/2019 05/28/2019  Falls in the past year? 0 0 0 0 0  Number falls in past yr: 0 0 0 0 0  Injury with Fall? 0 0 0 0 0  Risk for fall due to : - - No Fall Risks - -  Follow up - - Falls prevention discussed - -     Functional Status Survey: Is the patient deaf or have difficulty hearing?: No Does the patient have difficulty seeing, even when wearing glasses/contacts?: No Does the patient have difficulty concentrating,  remembering, or making decisions?: No Does the patient have difficulty walking or climbing stairs?: No Does the patient have difficulty dressing or bathing?: No Does the patient have difficulty doing errands alone such as visiting a doctor's office or shopping?: No    Assessment & Plan  1. Benign essential HTN  - losartan (COZAAR) 25 MG tablet; Take 1 tablet (25 mg total) by mouth daily.  Dispense: 90 tablet; Refill: 1 - CBC with Differential/Platelet - COMPLETE METABOLIC PANEL WITH GFR  2. Paroxysmal atrial fibrillation (HCC)  Rate controlled   3. Stage 3a chronic kidney disease (HCC)  - Microalbumin / creatinine urine ratio - CBC with Differential/Platelet - COMPLETE METABOLIC PANEL WITH GFR  4. Pre-diabetes   5. Atherosclerosis of aorta ([place])  On statin therapy   6. Chronic deep vein thrombosis (DVT) of right femoral vein (HCC)   7. Thrombocytopenia ([place])  Recheck labs  8. Vitamin B12 deficiency  - Vitamin B12  9. Late onset Alzheimer's disease without behavioral disturbance ([place])   10. Hypercholesterolemia without hypertriglyceridemia  - Lipid panel  11. Vitamin D deficiency  - Cholecalciferol (VITAMIN D) 50 MCG (2000 UT) CAPS; Take 1 capsule (2,000 Units total) by mouth daily.  Dispense: 100 capsule; Refill: 3 - VITAMIN D 25 Hydroxy (Vit-D Deficiency, Fractures)  12. Hyperglycemia  - Hemoglobin A1c

## 2020-10-20 ENCOUNTER — Other Ambulatory Visit: Payer: Self-pay

## 2020-10-20 ENCOUNTER — Encounter: Payer: Self-pay | Admitting: Family Medicine

## 2020-10-20 ENCOUNTER — Ambulatory Visit: Payer: Medicare PPO | Admitting: Family Medicine

## 2020-10-20 VITALS — BP 132/74 | HR 92 | Temp 98.4°F | Resp 16 | Ht 71.0 in | Wt 185.0 lb

## 2020-10-20 DIAGNOSIS — R7303 Prediabetes: Secondary | ICD-10-CM

## 2020-10-20 DIAGNOSIS — D696 Thrombocytopenia, unspecified: Secondary | ICD-10-CM | POA: Diagnosis not present

## 2020-10-20 DIAGNOSIS — I700 Atherosclerosis of aorta: Secondary | ICD-10-CM

## 2020-10-20 DIAGNOSIS — E7800 Pure hypercholesterolemia, unspecified: Secondary | ICD-10-CM

## 2020-10-20 DIAGNOSIS — F0280 Dementia in other diseases classified elsewhere without behavioral disturbance: Secondary | ICD-10-CM

## 2020-10-20 DIAGNOSIS — I82511 Chronic embolism and thrombosis of right femoral vein: Secondary | ICD-10-CM

## 2020-10-20 DIAGNOSIS — I10 Essential (primary) hypertension: Secondary | ICD-10-CM | POA: Diagnosis not present

## 2020-10-20 DIAGNOSIS — E559 Vitamin D deficiency, unspecified: Secondary | ICD-10-CM

## 2020-10-20 DIAGNOSIS — G301 Alzheimer's disease with late onset: Secondary | ICD-10-CM

## 2020-10-20 DIAGNOSIS — I480 Paroxysmal atrial fibrillation: Secondary | ICD-10-CM

## 2020-10-20 DIAGNOSIS — E538 Deficiency of other specified B group vitamins: Secondary | ICD-10-CM

## 2020-10-20 DIAGNOSIS — R739 Hyperglycemia, unspecified: Secondary | ICD-10-CM

## 2020-10-20 DIAGNOSIS — N1831 Chronic kidney disease, stage 3a: Secondary | ICD-10-CM | POA: Diagnosis not present

## 2020-10-20 MED ORDER — VITAMIN D 50 MCG (2000 UT) PO CAPS: 1.0000 | ORAL_CAPSULE | Freq: Every day | ORAL | 3 refills | Status: AC

## 2020-10-20 MED ORDER — LOSARTAN POTASSIUM 25 MG PO TABS: 25.0000 mg | ORAL_TABLET | Freq: Every day | ORAL | 1 refills | Status: DC

## 2020-10-21 LAB — COMPLETE METABOLIC PANEL WITH GFR
AG Ratio: 1.5 (calc) (ref 1.0–2.5)
ALT: 19 U/L (ref 9–46)
AST: 14 U/L (ref 10–35)
Albumin: 4 g/dL (ref 3.6–5.1)
Alkaline phosphatase (APISO): 75 U/L (ref 35–144)
BUN/Creatinine Ratio: 12 (calc) (ref 6–22)
BUN: 16 mg/dL (ref 7–25)
CO2: 25 mmol/L (ref 20–32)
Calcium: 8.7 mg/dL (ref 8.6–10.3)
Chloride: 106 mmol/L (ref 98–110)
Creat: 1.37 mg/dL — ABNORMAL HIGH (ref 0.70–1.22)
Globulin: 2.7 g/dL (calc) (ref 1.9–3.7)
Glucose, Bld: 91 mg/dL (ref 65–99)
Potassium: 3.8 mmol/L (ref 3.5–5.3)
Sodium: 141 mmol/L (ref 135–146)
Total Bilirubin: 1.2 mg/dL (ref 0.2–1.2)
Total Protein: 6.7 g/dL (ref 6.1–8.1)
eGFR: 52 mL/min/{1.73_m2} — ABNORMAL LOW (ref >=60)

## 2020-10-21 LAB — CBC WITH DIFFERENTIAL/PLATELET
Absolute Monocytes: 737 cells/uL (ref 200–950)
Basophils Absolute: 19 cells/uL (ref 0–200)
Basophils Relative: 0.2 %
Eosinophils Absolute: 39 cells/uL (ref 15–500)
Eosinophils Relative: 0.4 %
HCT: 44.4 % (ref 38.5–50.0)
Hemoglobin: 15 g/dL (ref 13.2–17.1)
Lymphs Abs: 1019 cells/uL (ref 850–3900)
MCH: 32.8 pg (ref 27.0–33.0)
MCHC: 33.8 g/dL (ref 32.0–36.0)
MCV: 97.2 fL (ref 80.0–100.0)
MPV: 11.9 fL (ref 7.5–12.5)
Monocytes Relative: 7.6 %
Neutro Abs: 7886 cells/uL — ABNORMAL HIGH (ref 1500–7800)
Neutrophils Relative %: 81.3 %
Platelets: 141 10*3/uL (ref 140–400)
RBC: 4.57 10*6/uL (ref 4.20–5.80)
RDW: 11.7 % (ref 11.0–15.0)
Total Lymphocyte: 10.5 %
WBC: 9.7 10*3/uL (ref 3.8–10.8)

## 2020-10-21 LAB — MICROALBUMIN / CREATININE URINE RATIO
Creatinine, Urine: 180 mg/dL (ref 20–320)
Microalb Creat Ratio: 5 mcg/mg creat (ref <30)
Microalb, Ur: 0.9 mg/dL

## 2020-10-21 LAB — LIPID PANEL
Cholesterol: 142 mg/dL (ref <200)
HDL: 62 mg/dL (ref >=40)
LDL Cholesterol (Calc): 66 mg/dL (calc)
Non-HDL Cholesterol (Calc): 80 mg/dL (calc) (ref <130)
Total CHOL/HDL Ratio: 2.3 (calc) (ref <5.0)
Triglycerides: 59 mg/dL (ref <150)

## 2020-10-21 LAB — VITAMIN D 25 HYDROXY (VIT D DEFICIENCY, FRACTURES): Vit D, 25-Hydroxy: 25 ng/mL — ABNORMAL LOW (ref 30–100)

## 2020-10-21 LAB — HEMOGLOBIN A1C
Hgb A1c MFr Bld: 5.7 % of total Hgb — ABNORMAL HIGH (ref <5.7)
Mean Plasma Glucose: 117 mg/dL
eAG (mmol/L): 6.5 mmol/L

## 2020-10-21 LAB — VITAMIN B12: Vitamin B-12: 1230 pg/mL — ABNORMAL HIGH (ref 200–1100)

## 2020-11-01 DIAGNOSIS — I10 Essential (primary) hypertension: Secondary | ICD-10-CM | POA: Diagnosis not present

## 2020-11-01 DIAGNOSIS — Z86718 Personal history of other venous thrombosis and embolism: Secondary | ICD-10-CM | POA: Diagnosis not present

## 2020-11-01 DIAGNOSIS — R0602 Shortness of breath: Secondary | ICD-10-CM | POA: Diagnosis not present

## 2020-11-01 DIAGNOSIS — E7800 Pure hypercholesterolemia, unspecified: Secondary | ICD-10-CM | POA: Diagnosis not present

## 2020-11-01 DIAGNOSIS — I495 Sick sinus syndrome: Secondary | ICD-10-CM | POA: Diagnosis not present

## 2020-11-01 DIAGNOSIS — I480 Paroxysmal atrial fibrillation: Secondary | ICD-10-CM | POA: Diagnosis not present

## 2020-11-01 DIAGNOSIS — E785 Hyperlipidemia, unspecified: Secondary | ICD-10-CM | POA: Diagnosis not present

## 2020-11-01 DIAGNOSIS — R079 Chest pain, unspecified: Secondary | ICD-10-CM | POA: Diagnosis not present

## 2020-11-01 DIAGNOSIS — R001 Bradycardia, unspecified: Secondary | ICD-10-CM | POA: Diagnosis not present

## 2020-11-14 ENCOUNTER — Other Ambulatory Visit: Payer: Self-pay | Admitting: Family Medicine

## 2020-11-14 DIAGNOSIS — I480 Paroxysmal atrial fibrillation: Secondary | ICD-10-CM

## 2020-11-22 ENCOUNTER — Other Ambulatory Visit: Payer: Self-pay | Admitting: Family Medicine

## 2020-11-27 ENCOUNTER — Other Ambulatory Visit: Payer: Self-pay | Admitting: Family Medicine

## 2020-11-27 DIAGNOSIS — E7800 Pure hypercholesterolemia, unspecified: Secondary | ICD-10-CM

## 2020-11-27 NOTE — Telephone Encounter (Signed)
Pt seen on 8/05.

## 2020-12-20 ENCOUNTER — Other Ambulatory Visit: Payer: Self-pay

## 2020-12-20 ENCOUNTER — Ambulatory Visit: Payer: Medicare PPO

## 2021-01-08 ENCOUNTER — Ambulatory Visit (INDEPENDENT_AMBULATORY_CARE_PROVIDER_SITE_OTHER): Payer: Medicare PPO

## 2021-01-08 ENCOUNTER — Other Ambulatory Visit: Payer: Self-pay

## 2021-01-08 DIAGNOSIS — Z23 Encounter for immunization: Secondary | ICD-10-CM

## 2021-01-17 ENCOUNTER — Encounter: Payer: Self-pay | Admitting: Family Medicine

## 2021-02-06 IMAGING — MR MRI ABDOMEN WITH AND WITHOUT CONTRAST
20 of 23 series · 43 of 48 positions shown · IV contrast (8ml Gadavist)
Comparison: MRI 07/03/2017

CLINICAL DATA: Followup pancreas cyst.

EXAM:
MRI ABDOMEN WITHOUT AND WITH CONTRAST
TECHNIQUE: Multiplanar multisequence MR imaging of the abdomen was performed
both before and after the administration of intravenous contrast.
CONTRAST:  8 cc of Gadavist.

[Series 2: cor haste · coronal · 6.0mm · 1.12mm/px · 2 of 32 slices shown]
[im 1/32]
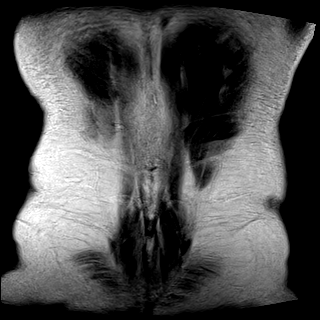
[im 32/32]
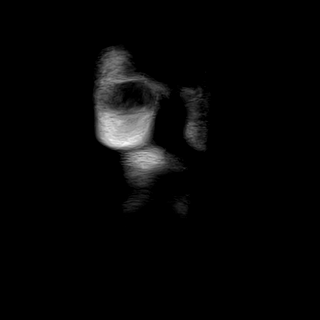

[Series 3: ax haste · axial · 6.0mm · 1.19mm/px · z∈[-142,+81]mm · 2 of 32 slices shown]
[im 1/32]
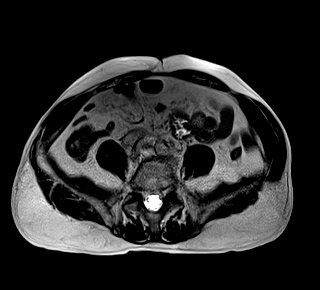
[im 32/32]
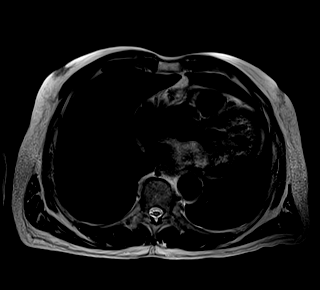

[Series 6: T2 fat-sat · axial · 6.0mm · 1.19mm/px · z∈[-139,+85]mm · 2 of 32 slices shown]
[im 1/32]
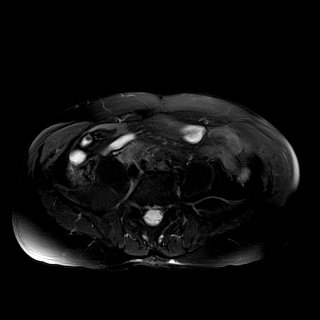
[im 32/32]
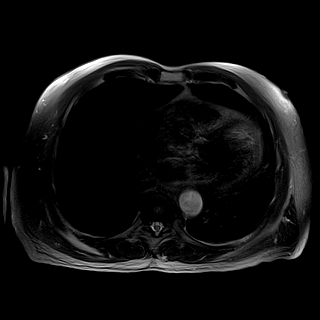

[Series 7: ax dwi_tracew · axial · 6.0mm · 1.42mm/px · z∈[-139,+85]mm · 2 of 32 slices shown (1 of 3)]
[im 1/32]
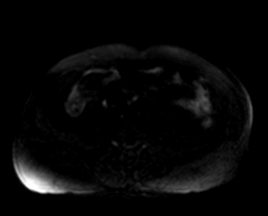
[im 32/32]
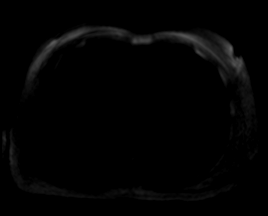

[Series 7: ax dwi_tracew · axial · 6.0mm · 1.42mm/px · 1 of 32 slices shown (2 of 3)]
[im 1/32]
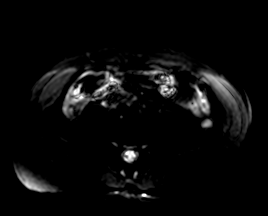

[Series 7: ax dwi_tracew · axial · 6.0mm · 1.42mm/px · 1 of 32 slices shown (3 of 3)]
[im 1/32]
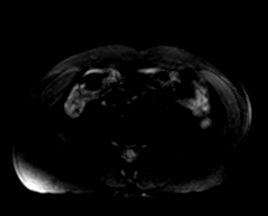

[Series 8: ax dwi_adc · axial · 6.0mm · 1.42mm/px · 1 of 32 slices shown]
[im 1/32]
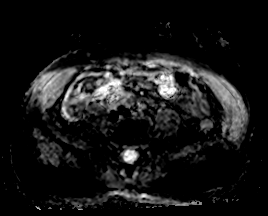

[Series 9: T1 · axial · 6.0mm · 0.74mm/px · 1 of 32 slices shown (1 of 2)]
[im 1/32]
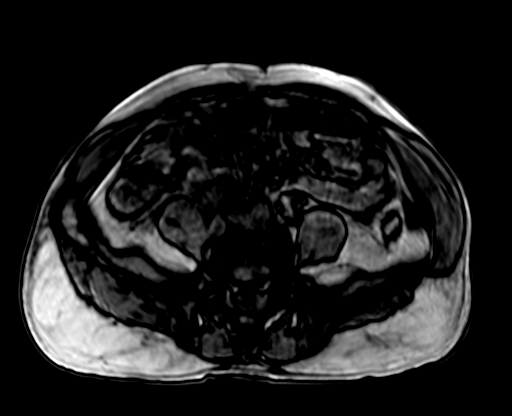

[Series 9: T1 · axial · 6.0mm · 0.74mm/px · 1 of 32 slices shown (2 of 2)]
[im 1/32]
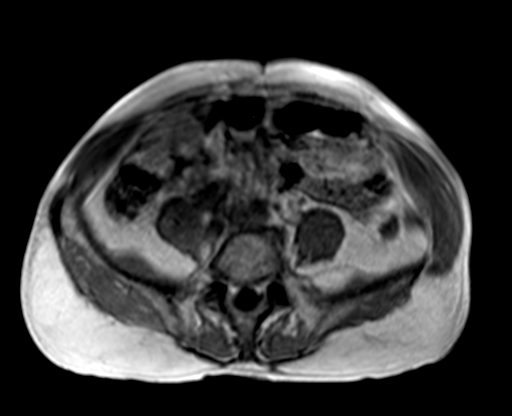

[Series 16: bSSFP · axial · 6.0mm · 0.74mm/px · 1 of 32 slices shown]
[im 1/32]
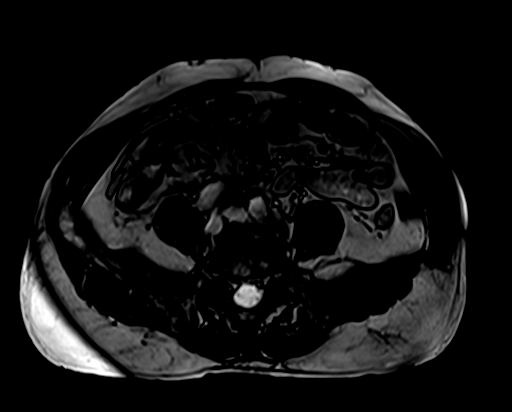

[Series 19: t2_space_cor_cs24_bh_384_mip_radials · sagittal · 0.49mm/px · 1 of 19 slices shown]
[im 1/19]
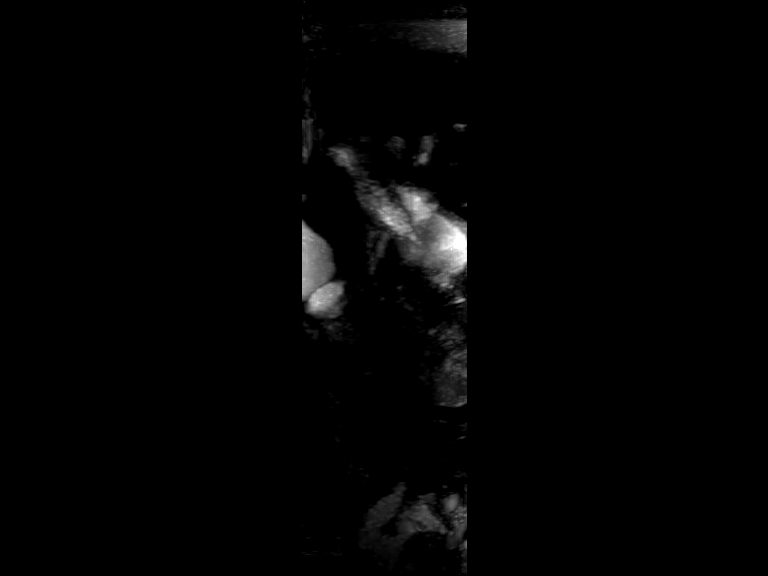

[Series 20: T1 dynamic fat-sat · axial · non-contrast · 3.0mm · 1.19mm/px · z∈[-137,+76]mm · 3 of 72 slices shown (1 of 5)]
[im 1/72]
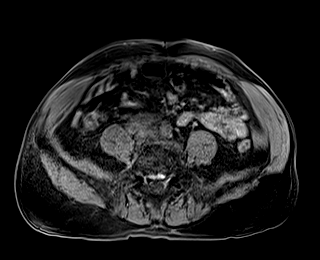
[im 36/72]
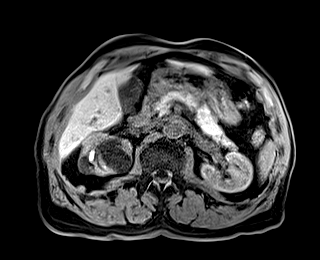
[im 72/72]
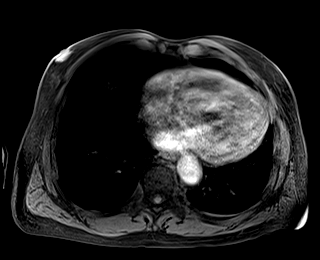

[Series 21: T1 dynamic fat-sat post-contrast · axial · 3.0mm · 1.19mm/px · z∈[-137,+76]mm · 3 of 72 slices shown (1 of 3)]
[im 1/72]
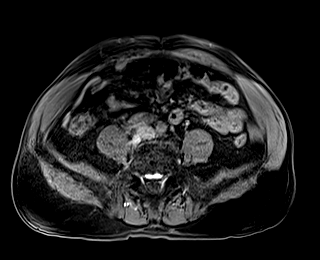
[im 36/72]
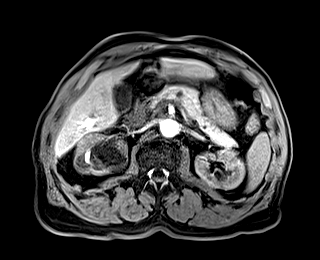
[im 72/72]
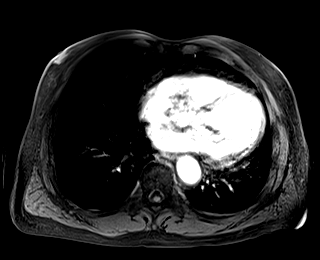

[Series 22: T1 dynamic fat-sat · axial · 3.0mm · 1.19mm/px · z∈[-137,+76]mm · 3 of 72 slices shown (2 of 5)]
[im 1/72]
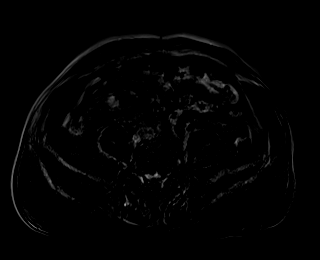
[im 36/72]
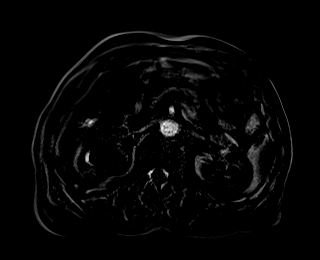
[im 72/72]
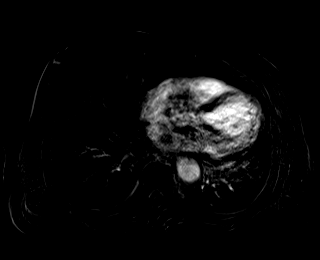

[Series 23: T1 dynamic fat-sat post-contrast · axial · 3.0mm · 1.19mm/px · z∈[-137,+76]mm · 3 of 72 slices shown (2 of 3)]
[im 1/72]
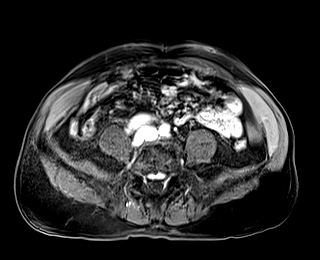
[im 36/72]
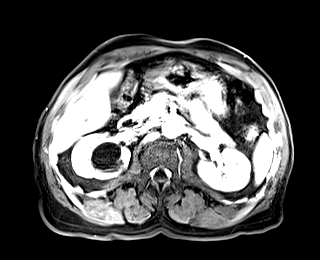
[im 72/72]
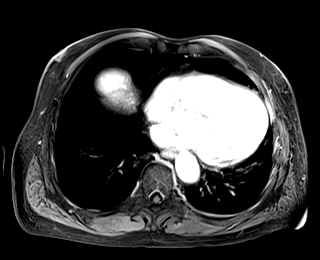

[Series 24: T1 dynamic fat-sat · axial · 3.0mm · 1.19mm/px · z∈[-137,+76]mm · 3 of 72 slices shown (3 of 5)]
[im 1/72]
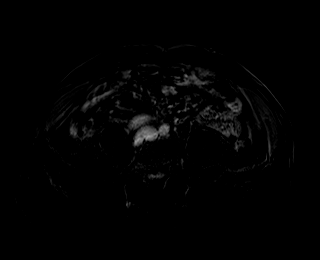
[im 36/72]
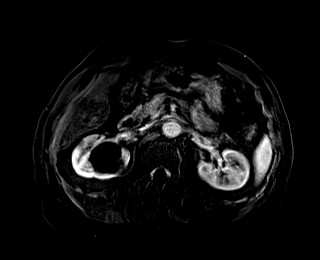
[im 72/72]
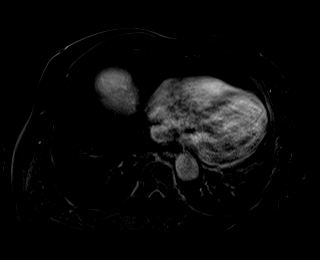

[Series 26: T1 dynamic fat-sat · axial · 3.0mm · 1.19mm/px · z∈[-137,+76]mm · 3 of 72 slices shown (4 of 5)]
[im 1/72]
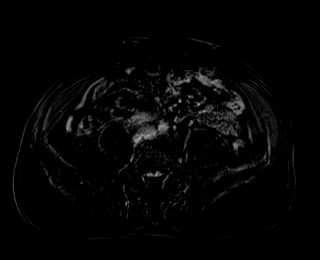
[im 36/72]
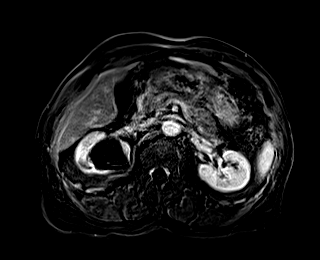
[im 72/72]
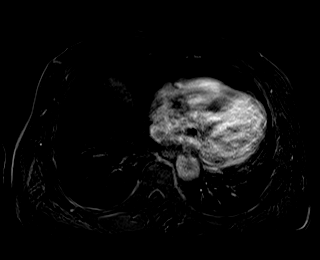

[Series 27: T1 dynamic post-contrast · coronal · 3.0mm · 1.12mm/px · 4 of 80 slices shown]
[im 1/80]
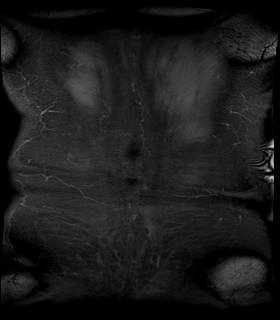
[im 27/80]
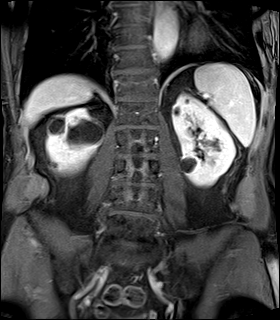
[im 53/80]
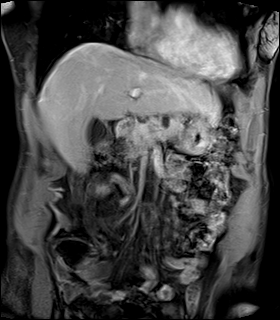
[im 80/80]
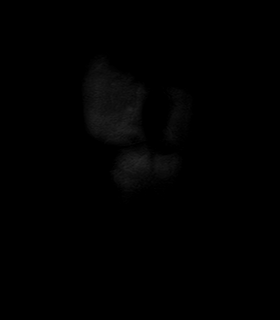

[Series 28: T1 dynamic fat-sat post-contrast · axial · 3.0mm · 1.19mm/px · z∈[-137,+76]mm · 3 of 72 slices shown (3 of 3)]
[im 1/72]
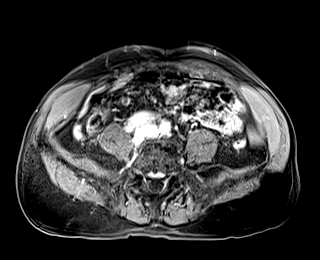
[im 36/72]
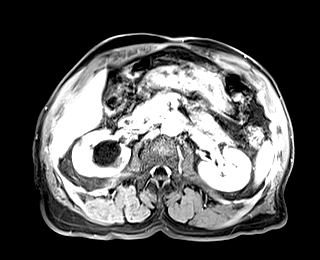
[im 72/72]
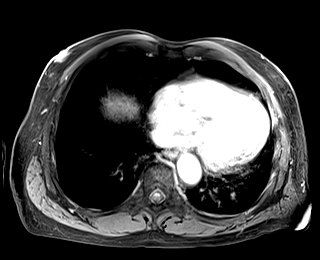

[Series 29: T1 dynamic fat-sat · axial · 3.0mm · 1.19mm/px · z∈[-137,+76]mm · 3 of 72 slices shown (5 of 5)]
[im 1/72]
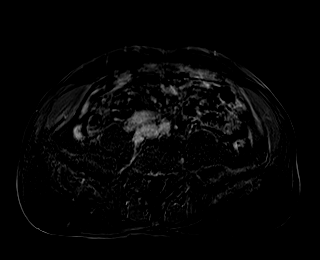
[im 36/72]
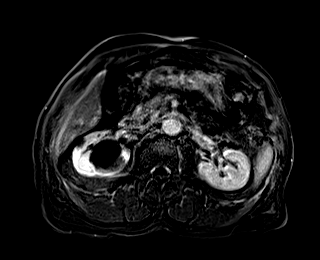
[im 72/72]
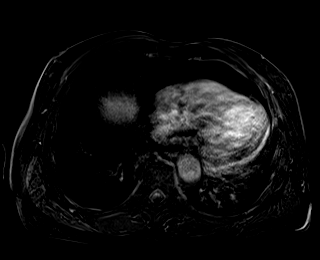

[43 of 48 positions shown; findings below may reference images not displayed]

FINDINGS: Lower chest: No acute findings.

Hepatobiliary: No mass or other parenchymal abnormality identified.
The gallbladder appears within normal limits. No biliary dilatation.

Pancreas: No inflammation or main duct dilatation identified. Cystic
lesion arising from the body of pancreas is again identified. On
today's exam this measures 1.1 x 1.0 cm. On the previous exam this
measured 1.0 x 0.6 cm. On the coronal images this cyst measures
cm in maximum dimension, image [DATE]. Previously 1.1 cm. No
additional pancreas lesions identified. No main duct dilatation.

Spleen:  Within normal limits in size and appearance.

Adrenals/Urinary Tract:  Normal appearance of the adrenal glands.

Bilateral kidney cysts are again identified. These are similar to
the previous exam. The largest arises from the upper pole of right
kidney measuring 4.4 cm. Previously this lesion measured 4.0 cm. No
solid enhancing kidney lesion or hydronephrosis.

Stomach/Bowel: Visualized portions within the abdomen are
unremarkable.

Vascular/Lymphatic: Aortic atherosclerosis without aneurysm. No
abdominal adenopathy.

Other:  None.

Musculoskeletal: No suspicious bone lesions identified.
IMPRESSION: 1. The cystic lesion within the body of pancreas is again noted.
This has a maximum diameter of 1.6 cm taken from the coronal images.
Previously, using the same measurement technique this had a maximum
diameter of 1.1 cm. Based on size of this lesion follow-up imaging
at 12 months is now recommended with repeat pancreas protocol MRI
with contrast material. This recommendation follows ACR consensus
guidelines: Management of Incidental Pancreatic Cysts: A White Paper
of the ACR Incidental Findings Committee. [HOSPITAL]
2. Bilateral kidney cysts.

## 2021-02-16 NOTE — Progress Notes (Addendum)
Name: Ronald Fleming   MRN: [id number]    DOB: [birth year]   Date:02/19/2021       Progress Note  Subjective  Chief Complaint  Follow Up  HPI  HTN: No chest pain he has occasional palpation. He denies dizziness, we went down on losartan from 50 to 25 mg daily and bp is still towards low end of normal, discussed cutting dose in half until next visit , he will try it and if still in the 120/70's we will stop medication on his next visit   CKI stage III: stable, urine micro negative, denies pruritis, on ARB advised to stay off NSAID's  Hyperlipidemia: taking statins, denies side effects, last LDL was down to 66  He also has atherosclerosis of aorta, is on statin therapy , off aspirin, but takes pradaxa daily. Reviewed last labs with patient today    Alzheimer's late onset: wife noticed he had memory loss, seeing Dr Manuella Ghazi ( Neurologist ) and is now on Razadyne 12mg  twice daily e has been on medication since Fall 2017, he chose to stop medication. Since wife had brain surgery 2021 , both sons are not living in town, and [place] lives next door and checks on them daily, cooking and helping around the house He is still able to perform ADL on his own, still pays his bills. Son's have access and they monitor what he is doing financially. Stable.   Pre-diabetes: last hgbA1C was 5.7%  he denies polyphagia, polydipsia, he has BPH and urinary frequency is stable    Afib: he states doing well, occasional palpitation and SOB with activity but denies chest pain,  he is on Pradaxa and denies easy bleeding or bruising. Sees Dr Clayborn Bigness, he has been off aspirin because of risk of bleeding. He denies side effects of medications    BPH: he has urinary frequency during the day,  he also has nocturia two to three times daily, but able to fall back asleep, he has night lights now,  he is on Flomax and sees Dr. Bernardo Heater. He has a history of hematuria secondary to kidney stones but no recent episodes.    Pancreas cyst:  found on CT for stone search, had repeat MRI 10/2018 and repeat was done 10/2019 . He has good appetite, no nausea, vomiting or abdominal pain. He is symptom free and does not want any further evaluation   IMPRESSION: 2.4 cm unilocular cystic lesion in the pancreatic body, showing progressive increase in size from 1.3 cm on 2019 exam. EUS/FNA should be considered for further evaluation. This recommendation follows ACR consensus guidelines: Management of Incidental Pancreatic Cysts: A White Paper of the ACR Incidental Findings Committee. [place] [id number];[id number].   Chronic DVT right leg, also has varicose veins, no pain or discomfort at this time, he has intermittent right lower extremity edema that resolves with compression stocking hoses . He is doing fine at this time, still on Pradaxa due to Afib  Patient Active Problem List   Diagnosis Date Noted   Thrombocytopenia ([place]) 04/21/2020   Thrombocytosis 10/29/2017   DDD (degenerative disc disease), lumbosacral 09/11/2017   Nephrolithiasis 09/11/2017   Inguinal hernia of right side without obstruction or gangrene 09/11/2017   Vitamin D deficiency 09/11/2017   Lesion of pancreas 07/09/2017   Atherosclerosis of aorta ([place]) 07/01/2017   Osteopenia 05/21/2017   Atrioventricular block, second degree 05/29/2016   Vitamin B12 deficiency 05/21/2016   Late onset Alzheimer's disease without behavioral disturbance ([place]) 05/21/2016  Vitiligo 02/12/2016   History of DVT of lower extremity 08/03/2015   BPH (benign prostatic hyperplasia) 08/03/2015   Atrial fibrillation ([place]) 02/01/2015   Edema leg 08/23/2014   Neuropathy 08/23/2014   Benign essential HTN 08/27/2006   Hypercholesterolemia without hypertriglyceridemia 08/27/2006    Past Surgical History:  Procedure Laterality Date   COLONOSCOPY     HERNIA REPAIR  11/09/2017   INGUINAL HERNIA REPAIR Right 11/10/2017   Procedure: LAPAROSCOPIC RIGHT  INGUINAL HERNIA REPAIR;  Surgeon:  Ralene Ok, MD;  Location: [facility];  Service: General;  Laterality: Right;   INGUINAL HERNIA REPAIR Left 02/23/2020   Procedure: LAPAROSCOPIC LEFT  INGUINAL HERNIA WITH MESH;  Surgeon: Ralene Ok, MD;  Location: [facility];  Service: General;  Laterality: Left;   INSERTION OF MESH Right 11/10/2017   Procedure: INSERTION OF MESH;  Surgeon: Ralene Ok, MD;  Location: [facility];  Service: General;  Laterality: Right;    Family History  Problem Relation Age of Onset   Coronary artery disease Father    Stroke Son    Kidney disease Neg Hx    Prostate cancer Neg Hx    Bladder Cancer Neg Hx     Social History   Tobacco Use   Smoking status: Never   Smokeless tobacco: Never  Substance Use Topics   Alcohol use: Never    Alcohol/week: 0.0 standard drinks     Current Outpatient Medications:    atorvastatin (LIPITOR) 20 MG tablet, TAKE 1 TABLET EVERY DAY, Disp: 90 tablet, Rfl: 1   Cholecalciferol (VITAMIN D) 50 MCG (2000 UT) CAPS, Take 1 capsule (2,000 Units total) by mouth daily., Disp: 100 capsule, Rfl: 3   losartan (COZAAR) 25 MG tablet, Take 1 tablet (25 mg total) by mouth daily., Disp: 90 tablet, Rfl: 1   PRADAXA 150 MG CAPS capsule, Take 1 capsule by mouth twice daily, Disp: 180 capsule, Rfl: 0   tamsulosin (FLOMAX) 0.4 MG CAPS capsule, TAKE 1 CAPSULE EVERY DAY, Disp: 90 capsule, Rfl: 1   vitamin B-12 (CYANOCOBALAMIN) 1000 MCG tablet, Take 2,000 mcg by mouth 3 (three) times a week. , Disp: , Rfl:   No Known Allergies  I personally reviewed active problem list, medication list, allergies, family history, social history, health maintenance with the patient/caregiver today.   ROS  Constitutional: Negative for fever or weight change.  Respiratory: Negative for cough and shortness of breath.   Cardiovascular: Negative for chest pain or palpitations.  Gastrointestinal: Negative for abdominal pain, no bowel changes.  Musculoskeletal: Negative for gait problem or joint swelling.   Skin: Negative for rash.  Neurological: Negative for dizziness or headache.  No other specific complaints in a complete review of systems (except as listed in HPI above).   Objective  Vitals:   02/19/21 1002  BP: 120/70  Pulse: 70  Resp: 16  Temp: (!) 97.5 F (36.4 C)  SpO2: 98%  Weight: 180 lb (81.6 kg)  Height: 5\' 11"  (1.803 m)    Body mass index is 25.1 kg/m.  Physical Exam  Constitutional: Patient appears well-developed and well-nourished.  No distress.  HEENT: head atraumatic, normocephalic, pupils equal and reactive to light,  neck supple Cardiovascular: Normal rate, irregular rhythm and normal heart sounds.  No murmur heard. No BLE edema. Pulmonary/Chest: Effort normal and breath sounds normal. No respiratory distress. Abdominal: Soft.  There is no tenderness. Psychiatric: Patient has a normal mood and affect. behavior is normal. Judgment and thought content normal.   PHQ2/9: Depression screen [facility] 2/9  02/19/2021 10/20/2020 04/21/2020 02/29/2020 10/19/2019  Decreased Interest 0 0 0 0 0  Down, Depressed, Hopeless 0 0 0 0 0  PHQ - 2 Score 0 0 0 0 0  Altered sleeping 0 - - - 0  Tired, decreased energy 0 - - - 0  Change in appetite 0 - - - 0  Feeling bad or failure about yourself  0 - - - 0  Trouble concentrating 0 - - - 0  Moving slowly or fidgety/restless 0 - - - 0  Suicidal thoughts 0 - - - 0  PHQ-9 Score 0 - - - 0  Difficult doing work/chores - - - - -  Some recent data might be hidden    phq 9 is negative   Fall Risk: Fall Risk  02/19/2021 10/20/2020 04/21/2020 02/29/2020 10/19/2019  Falls in the past year? 0 0 0 0 0  Number falls in past yr: 0 0 0 0 0  Injury with Fall? 0 0 0 0 0  Risk for fall due to : No Fall Risks - - No Fall Risks -  Follow up Falls prevention discussed - - Falls prevention discussed -      Functional Status Survey: Is the patient deaf or have difficulty hearing?: Yes Does the patient have difficulty seeing, even when wearing  glasses/contacts?: No Does the patient have difficulty concentrating, remembering, or making decisions?: No Does the patient have difficulty walking or climbing stairs?: No Does the patient have difficulty dressing or bathing?: No Does the patient have difficulty doing errands alone such as visiting a doctor's office or shopping?: No    Assessment & Plan  1. Stage 3a chronic kidney disease (HCC)  On ARB, reminded him not to take NSAID's  2. Atherosclerosis of aorta ([place])  On statin therapy   3. Paroxysmal atrial fibrillation (HCC)  Rate controlled - dabigatran (PRADAXA) 150 MG CAPS capsule; Take 1 capsule (150 mg total) by mouth 2 (two) times daily.  Dispense: 180 capsule; Refill: 1  4. Late onset Alzheimer's disease without behavioral disturbance ([place])   5. Chronic deep vein thrombosis (DVT) of right femoral vein (HCC)   6. Pre-diabetes  Stable   7. Benign essential HTN  - losartan (COZAAR) 25 MG tablet; Take 0.5 tablets (12.5 mg total) by mouth daily.  Dispense: 45 tablet; Refill: 0  8. Vitamin B12 deficiency  Take only once a week  9. Vitamin D deficiency  Continue supplementation   10. Hypercholesterolemia without hypertriglyceridemia   11. Benign prostatic hyperplasia (BPH) with urinary urgency   12. Pancreatic cyst

## 2021-02-19 ENCOUNTER — Other Ambulatory Visit: Payer: Self-pay

## 2021-02-19 ENCOUNTER — Encounter: Payer: Self-pay | Admitting: Family Medicine

## 2021-02-19 ENCOUNTER — Ambulatory Visit: Payer: Medicare PPO | Admitting: Family Medicine

## 2021-02-19 VITALS — BP 120/70 | HR 70 | Temp 97.5°F | Resp 16 | Ht 71.0 in | Wt 180.0 lb

## 2021-02-19 DIAGNOSIS — G301 Alzheimer's disease with late onset: Secondary | ICD-10-CM | POA: Diagnosis not present

## 2021-02-19 DIAGNOSIS — N1831 Chronic kidney disease, stage 3a: Secondary | ICD-10-CM | POA: Diagnosis not present

## 2021-02-19 DIAGNOSIS — R3915 Urgency of urination: Secondary | ICD-10-CM

## 2021-02-19 DIAGNOSIS — I82511 Chronic embolism and thrombosis of right femoral vein: Secondary | ICD-10-CM

## 2021-02-19 DIAGNOSIS — R7303 Prediabetes: Secondary | ICD-10-CM

## 2021-02-19 DIAGNOSIS — I480 Paroxysmal atrial fibrillation: Secondary | ICD-10-CM | POA: Diagnosis not present

## 2021-02-19 DIAGNOSIS — I700 Atherosclerosis of aorta: Secondary | ICD-10-CM | POA: Diagnosis not present

## 2021-02-19 DIAGNOSIS — E559 Vitamin D deficiency, unspecified: Secondary | ICD-10-CM

## 2021-02-19 DIAGNOSIS — I10 Essential (primary) hypertension: Secondary | ICD-10-CM | POA: Diagnosis not present

## 2021-02-19 DIAGNOSIS — E538 Deficiency of other specified B group vitamins: Secondary | ICD-10-CM

## 2021-02-19 DIAGNOSIS — N401 Enlarged prostate with lower urinary tract symptoms: Secondary | ICD-10-CM

## 2021-02-19 DIAGNOSIS — F0280 Dementia in other diseases classified elsewhere without behavioral disturbance: Secondary | ICD-10-CM

## 2021-02-19 DIAGNOSIS — E7800 Pure hypercholesterolemia, unspecified: Secondary | ICD-10-CM

## 2021-02-19 DIAGNOSIS — K862 Cyst of pancreas: Secondary | ICD-10-CM

## 2021-02-19 MED ORDER — DABIGATRAN ETEXILATE MESYLATE 150 MG PO CAPS: 150.0000 mg | ORAL_CAPSULE | Freq: Two times a day (BID) | ORAL | 1 refills | Status: DC

## 2021-02-19 MED ORDER — LOSARTAN POTASSIUM 25 MG PO TABS: 12.5000 mg | ORAL_TABLET | Freq: Every day | ORAL | 0 refills | Status: DC

## 2021-02-19 NOTE — Patient Instructions (Addendum)
Please cut losartan 25 mg in half and take one half pill daily only until next visit , check bp at home and let us know if it goes above 140/08  Back down on B12 to at most twice weekly

## 2021-03-01 ENCOUNTER — Ambulatory Visit (INDEPENDENT_AMBULATORY_CARE_PROVIDER_SITE_OTHER): Payer: Medicare PPO

## 2021-03-01 DIAGNOSIS — Z0000 Encounter for general adult medical examination without abnormal findings: Secondary | ICD-10-CM

## 2021-03-01 NOTE — Patient Instructions (Addendum)
Mr. Ronald Fleming , Thank you for taking time to come for your Medicare Wellness Visit. I appreciate your ongoing commitment to your health goals. Please review the following plan we discussed and let me know if I can assist you in the future.   Screening recommendations/referrals: Colonoscopy: no longer required Recommended yearly ophthalmology/optometry visit for glaucoma screening and checkup Recommended yearly dental visit for hygiene and checkup  Vaccinations: Influenza vaccine: done 01/08/21 Pneumococcal vaccine: done 05/25/13 Tdap vaccine: due Shingles vaccine: Shingrix discussed. Please contact your pharmacy for coverage information.  Covid-19:  done 05/14/19, 06/15/19; please bring a copy of your vaccine record to your next appt for booster information  Advanced directives: Please bring a copy of your health care power of attorney and living will to the office at your convenience.   Conditions/risks identified: Keep up the great work!  Next appointment: Follow up in one year for your annual wellness visit.   Preventive Care 83 Years and Older, Male Preventive care refers to lifestyle choices and visits with your health care provider that can promote health and wellness. What does preventive care include? A yearly physical exam. This is also called an annual well check. Dental exams once or twice a year. Routine eye exams. Ask your health care provider how often you should have your eyes checked. Personal lifestyle choices, including: Daily care of your teeth and gums. Regular physical activity. Eating a healthy diet. Avoiding tobacco and drug use. Limiting alcohol use. Practicing safe sex. Taking low doses of aspirin every day. Taking vitamin and mineral supplements as recommended by your health care provider. What happens during an annual well check? The services and screenings done by your health care provider during your annual well check will depend on your age, overall health,  lifestyle risk factors, and family history of disease. Counseling  Your health care provider may ask you questions about your: Alcohol use. Tobacco use. Drug use. Emotional well-being. Home and relationship well-being. Sexual activity. Eating habits. History of falls. Memory and ability to understand (cognition). Work and work Statistician. Screening  You may have the following tests or measurements: Height, weight, and BMI. Blood pressure. Lipid and cholesterol levels. These may be checked every 5 years, or more frequently if you are over 75 years old. Skin check. Lung cancer screening. You may have this screening every year starting at age 23 if you have a 30-pack-year history of smoking and currently smoke or have quit within the past 15 years. Fecal occult blood test (FOBT) of the stool. You may have this test every year starting at age 15. Flexible sigmoidoscopy or colonoscopy. You may have a sigmoidoscopy every 5 years or a colonoscopy every 10 years starting at age 50. Prostate cancer screening. Recommendations will vary depending on your family history and other risks. Hepatitis C blood test. Hepatitis B blood test. Sexually transmitted disease (STD) testing. Diabetes screening. This is done by checking your blood sugar (glucose) after you have not eaten for a while (fasting). You may have this done every 1-3 years. Abdominal aortic aneurysm (AAA) screening. You may need this if you are a current or former smoker. Osteoporosis. You may be screened starting at age 18 if you are at high risk. Talk with your health care provider about your test results, treatment options, and if necessary, the need for more tests. Vaccines  Your health care provider may recommend certain vaccines, such as: Influenza vaccine. This is recommended every year. Tetanus, diphtheria, and acellular pertussis (Tdap, Td) vaccine.  You may need a Td booster every 10 years. Zoster vaccine. You may need this  after age 26. Pneumococcal 13-valent conjugate (PCV13) vaccine. One dose is recommended after age 63. Pneumococcal polysaccharide (PPSV23) vaccine. One dose is recommended after age 50. Talk to your health care provider about which screenings and vaccines you need and how often you need them. This information is not intended to replace advice given to you by your health care provider. Make sure you discuss any questions you have with your health care provider. Document Released: 03/31/2015 Document Revised: 11/22/2015 Document Reviewed: 01/03/2015 Elsevier Interactive Patient Education  2017 [place] Prevention in the Home Falls can cause injuries. They can happen to people of all ages. There are many things you can do to make your home safe and to help prevent falls. What can I do on the outside of my home? Regularly fix the edges of walkways and driveways and fix any cracks. Remove anything that might make you trip as you walk through a door, such as a raised step or threshold. Trim any bushes or trees on the path to your home. Use bright outdoor lighting. Clear any walking paths of anything that might make someone trip, such as rocks or tools. Regularly check to see if handrails are loose or broken. Make sure that both sides of any steps have handrails. Any raised decks and porches should have guardrails on the edges. Have any leaves, snow, or ice cleared regularly. Use sand or salt on walking paths during winter. Clean up any spills in your garage right away. This includes oil or grease spills. What can I do in the bathroom? Use night lights. Install grab bars by the toilet and in the tub and shower. Do not use towel bars as grab bars. Use non-skid mats or decals in the tub or shower. If you need to sit down in the shower, use a plastic, non-slip stool. Keep the floor dry. Clean up any water that spills on the floor as soon as it happens. Remove soap buildup in the tub or  shower regularly. Attach bath mats securely with double-sided non-slip rug tape. Do not have throw rugs and other things on the floor that can make you trip. What can I do in the bedroom? Use night lights. Make sure that you have a light by your bed that is easy to reach. Do not use any sheets or blankets that are too big for your bed. They should not hang down onto the floor. Have a firm chair that has side arms. You can use this for support while you get dressed. Do not have throw rugs and other things on the floor that can make you trip. What can I do in the kitchen? Clean up any spills right away. Avoid walking on wet floors. Keep items that you use a lot in easy-to-reach places. If you need to reach something above you, use a strong step stool that has a grab bar. Keep electrical cords out of the way. Do not use floor polish or wax that makes floors slippery. If you must use wax, use non-skid floor wax. Do not have throw rugs and other things on the floor that can make you trip. What can I do with my stairs? Do not leave any items on the stairs. Make sure that there are handrails on both sides of the stairs and use them. Fix handrails that are broken or loose. Make sure that handrails are as long as  the stairways. Check any carpeting to make sure that it is firmly attached to the stairs. Fix any carpet that is loose or worn. Avoid having throw rugs at the top or bottom of the stairs. If you do have throw rugs, attach them to the floor with carpet tape. Make sure that you have a light switch at the top of the stairs and the bottom of the stairs. If you do not have them, ask someone to add them for you. What else can I do to help prevent falls? Wear shoes that: Do not have high heels. Have rubber bottoms. Are comfortable and fit you well. Are closed at the toe. Do not wear sandals. If you use a stepladder: Make sure that it is fully opened. Do not climb a closed stepladder. Make  sure that both sides of the stepladder are locked into place. Ask someone to hold it for you, if possible. Clearly mark and make sure that you can see: Any grab bars or handrails. First and last steps. Where the edge of each step is. Use tools that help you move around (mobility aids) if they are needed. These include: Canes. Walkers. Scooters. Crutches. Turn on the lights when you go into a dark area. Replace any light bulbs as soon as they burn out. Set up your furniture so you have a clear path. Avoid moving your furniture around. If any of your floors are uneven, fix them. If there are any pets around you, be aware of where they are. Review your medicines with your doctor. Some medicines can make you feel dizzy. This can increase your chance of falling. Ask your doctor what other things that you can do to help prevent falls. This information is not intended to replace advice given to you by your health care provider. Make sure you discuss any questions you have with your health care provider. Document Released: 12/29/2008 Document Revised: 08/10/2015 Document Reviewed: 04/08/2014 Elsevier Interactive Patient Education  2017 [employer].

## 2021-03-01 NOTE — Progress Notes (Signed)
Subjective:   Ronald Fleming is a 85 y.o. male who presents for Medicare Annual/Subsequent preventive examination.  Virtual Visit via Telephone Note  I connected with  Ronald Fleming on 03/01/21 at  9:20 AM EST by telephone and verified that I am speaking with the correct person using two identifiers.  Location: Patient: home Provider: [place] Persons participating in the virtual visit: [place]   I discussed the limitations, risks, security and privacy concerns of performing an evaluation and management service by telephone and the availability of in person appointments. The patient expressed understanding and agreed to proceed.  Interactive audio and video telecommunications were attempted between this nurse and patient, however failed, due to patient having technical difficulties OR patient did not have access to video capability.  We continued and completed visit with audio only.  Some vital signs may be absent or patient reported.   Ronald Marker, LPN   Review of Systems     Cardiac Risk Factors include: advanced age (>100men, >94 women);dyslipidemia;male gender;hypertension     Objective:    There were no vitals filed for this visit. There is no height or weight on file to calculate BMI.  Advanced Directives 03/01/2021 02/29/2020 02/23/2020 02/23/2019 02/20/2018 10/28/2017 12/09/2016  Does Patient Have a Medical Advance Directive? Yes Yes No Yes Yes Yes Yes  Type of Paramedic of [place];Living will [place];Living will - [place];Living will [place];Living will [place];Living will [place];Living will  Does patient want to make changes to medical advance directive? - - - - - No - Patient declined -  Copy of [place] in Chart? No - copy requested No - copy requested - No - copy requested No - copy requested No - copy  requested No - copy requested  Would patient like information on creating a medical advance directive? - - No - Patient declined - - - -    Current Medications (verified) Outpatient Encounter Medications as of 03/01/2021  Medication Sig   atorvastatin (LIPITOR) 20 MG tablet TAKE 1 TABLET EVERY DAY   Cholecalciferol (VITAMIN D) 50 MCG (2000 UT) CAPS Take 1 capsule (2,000 Units total) by mouth daily.   dabigatran (PRADAXA) 150 MG CAPS capsule Take 1 capsule (150 mg total) by mouth 2 (two) times daily.   losartan (COZAAR) 25 MG tablet Take 0.5 tablets (12.5 mg total) by mouth daily.   tamsulosin (FLOMAX) 0.4 MG CAPS capsule TAKE 1 CAPSULE EVERY DAY   vitamin B-12 (CYANOCOBALAMIN) 1000 MCG tablet Take 2,000 mcg by mouth 3 (three) times a week.    No facility-administered encounter medications on file as of 03/01/2021.    Allergies (verified) Patient has no known allergies.   History: Past Medical History:  Diagnosis Date   Arrhythmia    Arthritis    Benign positional vertigo    BPH (benign prostatic hyperplasia)    Chronic kidney disease, stage III (moderate) (HCC)    Deep vein blood clot of right lower extremity (HCC)    Dementia (HCC)    DVT (deep venous thrombosis) ([place])    Dysrhythmia    ApFib   Glaucoma    Hyperlipidemia    Hypertension    Nephrolithiasis    Neuropathy    Past Surgical History:  Procedure Laterality Date   COLONOSCOPY     HERNIA REPAIR  11/09/2017   INGUINAL HERNIA REPAIR Right 11/10/2017   Procedure:  LAPAROSCOPIC RIGHT  INGUINAL HERNIA REPAIR;  Surgeon: Ralene Ok, MD;  Location: [facility];  Service: General;  Laterality: Right;   INGUINAL HERNIA REPAIR Left 02/23/2020   Procedure: LAPAROSCOPIC LEFT  INGUINAL HERNIA WITH MESH;  Surgeon: Ralene Ok, MD;  Location: [facility];  Service: General;  Laterality: Left;   INSERTION OF MESH Right 11/10/2017   Procedure: INSERTION OF MESH;  Surgeon: Ralene Ok, MD;  Location: [facility];  Service: General;   Laterality: Right;   Family History  Problem Relation Age of Onset   Coronary artery disease Father    Stroke Son    Kidney disease Neg Hx    Prostate cancer Neg Hx    Bladder Cancer Neg Hx    Social History   Socioeconomic History   Marital status: Married    Spouse name: Risk analyst   Number of children: 3   Years of education: Not on file   Highest education level: 12th grade  Occupational History   Occupation: retired    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    They have two children that live in [place] and one son that lives in [place].   He likes to work in his garden    Social Determinants of Radio broadcast assistant Strain: Low Risk    Difficulty of Paying Living Expenses: Not very hard  Food Insecurity: No Food Insecurity   Worried About Charity fundraiser in the Last Year: Never true   Arboriculturist in the Last Year: Never true  Transportation Needs: No Transportation Needs   Lack of Transportation (Medical): No   Lack of Transportation (Non-Medical): No  Physical Activity: Insufficiently Active   Days of Exercise per Week: 5 days   Minutes of Exercise per Session: 20 min  Stress: No Stress Concern Present   Feeling of Stress : Not at all  Social Connections: Moderately Isolated   Frequency of Communication with Friends and Family: More than three times a week   Frequency of Social Gatherings with Friends and Family: More than three times a week   Attends Religious Services: Never   Marine scientist or Organizations: No   Attends Music therapist:  Never   Marital Status: Married    Tobacco Counseling Counseling given: Not Answered   Clinical Intake:  Pre-visit preparation completed: Yes  Pain : No/denies pain     Nutritional Risks: None Diabetes: No  How often do you need to have someone help you when you read instructions, pamphlets, or other written materials from your doctor or pharmacy?: 1 - Never    Interpreter Needed?: No  Information entered by :: Ronald Marker LPN   Activities of Daily Living In your present state of health, do you have any difficulty performing the following activities: 03/01/2021 02/19/2021  Hearing? Tempie Donning  Vision? N N  Difficulty concentrating or making decisions? N N  Walking or climbing stairs? N N  Dressing or bathing? N N  Doing errands, shopping? N N  Preparing Food and eating ? N -  Using the Toilet? N -  In the past six months, have you accidently leaked urine? N -  Do you have problems with loss of bowel control? N -  Managing your Medications? N -  Managing your Finances? N -  Housekeeping or managing your Housekeeping? N -  Some recent data might be hidden    Patient Care Team: Steele Sizer, MD as PCP - General (Family Medicine) Vickie Epley, MD as PCP - Electrophysiology (Cardiology) Yolonda Kida, MD as Consulting Physician (Cardiology) Vladimir Crofts, MD as Consulting Physician (Neurology) Ardis Hughs, MD as Attending Physician (Urology)  Indicate any recent Medical Services you may have received from other than [facility] providers in the past year (date may be approximate).     Assessment:   This is a routine wellness examination for [place].  Hearing/Vision screen Hearing Screening - Comments:: Pt denies hearing difficulty Vision Screening - Comments:: Annual vision screenings done at [facility]  Dietary issues and exercise activities discussed: Current Exercise Habits: Home exercise routine, Type of exercise: walking, Time (Minutes): 20,  Frequency (Times/Week): 5, Weekly Exercise (Minutes/Week): 100, Intensity: Mild, Exercise limited by: None identified   Goals Addressed   None    Depression Screen PHQ 2/9 Scores 03/01/2021 02/19/2021 10/20/2020 04/21/2020 02/29/2020 10/19/2019 05/28/2019  PHQ - 2 Score 0 0 0 0 0 0 0  PHQ- 9 Score - 0 - - - 0 0    Fall Risk Fall Risk  03/01/2021 02/19/2021 10/20/2020 04/21/2020 02/29/2020  Falls in the past year? 0 0 0 0 0  Number falls in past yr: 0 0 0 0 0  Injury with Fall? 0 0 0 0 0  Risk for fall due to : No Fall Risks No Fall Risks - - No Fall Risks  Follow up Falls prevention discussed Falls prevention discussed - - Falls prevention discussed    FALL RISK PREVENTION PERTAINING TO THE HOME:  Any stairs in or around the home? Yes  If so, are there any without handrails? No  Home free of loose throw rugs in walkways, pet beds, electrical cords, etc? Yes  Adequate lighting in your home to reduce risk of falls? Yes   ASSISTIVE DEVICES UTILIZED TO PREVENT FALLS:  Life alert? No  Use of a cane, walker or w/c? No  Grab bars in the bathroom? Yes  Shower chair or bench in shower? Yes  Elevated toilet seat or a handicapped toilet? No  TIMED UP AND GO:  Was the test performed? No . telephonic  Cognitive Function: Normal cognitive status assessed by direct observation by this Nurse Health Advisor. No abnormalities found.       6CIT Screen 02/29/2020 02/23/2019 02/20/2018  What Year? 0 points 0 points 0 points  What month? 0 points 0 points 0 points  What time? 0 points 0 points 0 points  Count back from 20 0 points 0 points 0 points  Months in reverse 0 points 0 points 0 points  Repeat phrase 0 points 6 points 0 points  Total Score 0 6 0    Immunizations Immunization History  Administered Date(s) Administered   Fluad Quad(high Dose 65+) 12/07/2018, 12/01/2019, 01/08/2021   Influenza Split 03/25/2008, 12/19/2008, 11/29/2009   Influenza, High Dose Seasonal PF 04/17/2015,  12/29/2015, 12/09/2016, 12/31/2017   Influenza, Seasonal, Injecte, Preservative Fre 12/18/2010, 12/06/2011   Moderna Sars-Covid-2 Vaccination 05/14/2019, 06/15/2019   Pneumococcal Conjugate-13 05/25/2013   Pneumococcal Polysaccharide-23 11/29/2009   Tdap  03/28/2010   Zoster, Live 11/09/2010    TDAP status: Due, Education has been provided regarding the importance of this vaccine. Advised may receive this vaccine at local pharmacy or Health Dept. Aware to provide a copy of the vaccination record if obtained from local pharmacy or Health Dept. Verbalized acceptance and understanding.  Flu Vaccine status: Up to date  Pneumococcal vaccine status: Up to date  Covid-19 vaccine status: Completed vaccines  Qualifies for Shingles Vaccine? Yes   Zostavax completed Yes   Shingrix Completed?: No.    Education has been provided regarding the importance of this vaccine. Patient has been advised to call insurance company to determine out of pocket expense if they have not yet received this vaccine. Advised may also receive vaccine at local pharmacy or Health Dept. Verbalized acceptance and understanding.  Screening Tests Health Maintenance  Topic Date Due   Zoster Vaccines- Shingrix (1 of 2) Never done   COVID-19 Vaccine (3 - Booster for Moderna series) 08/10/2019   TETANUS/TDAP  04/21/2021 (Originally 03/28/2020)   Pneumonia Vaccine 33+ Years old  Completed   INFLUENZA VACCINE  Completed   HPV VACCINES  Aged Out    Health Maintenance  Health Maintenance Due  Topic Date Due   Zoster Vaccines- Shingrix (1 of 2) Never done   COVID-19 Vaccine (3 - Booster for Moderna series) 08/10/2019    Colorectal cancer screening: No longer required.   Lung Cancer Screening: (Low Dose CT Chest recommended if Age 60-80 years, 30 pack-year currently smoking OR have quit w/in 15years.) does not qualify.    Additional Screening:  Hepatitis C Screening: does not qualify  Vision Screening: Recommended  annual ophthalmology exams for early detection of glaucoma and other disorders of the eye. Is the patient up to date with their annual eye exam?  Yes  Who is the provider or what is the name of the office in which the patient attends annual eye exams? [facility].   Dental Screening: Recommended annual dental exams for proper oral hygiene  Community Resource Referral / Chronic Care Management: CRR required this visit?  No   CCM required this visit?  No      Plan:     I have personally reviewed and noted the following in the patients chart:   Medical and social history Use of alcohol, tobacco or illicit drugs  Current medications and supplements including opioid prescriptions. Patient is not currently taking opioid prescriptions. Functional ability and status Nutritional status Physical activity Advanced directives List of other physicians Hospitalizations, surgeries, and ER visits in previous 12 months Vitals Screenings to include cognitive, depression, and falls Referrals and appointments  In addition, I have reviewed and discussed with patient certain preventive protocols, quality metrics, and best practice recommendations. A written personalized care plan for preventive services as well as general preventive health recommendations were provided to patient.     Ronald Marker, LPN   [id number]   Nurse Notes: none

## 2021-04-04 DIAGNOSIS — R079 Chest pain, unspecified: Secondary | ICD-10-CM | POA: Diagnosis not present

## 2021-04-04 DIAGNOSIS — Z86718 Personal history of other venous thrombosis and embolism: Secondary | ICD-10-CM | POA: Diagnosis not present

## 2021-04-04 DIAGNOSIS — I495 Sick sinus syndrome: Secondary | ICD-10-CM | POA: Diagnosis not present

## 2021-04-04 DIAGNOSIS — E785 Hyperlipidemia, unspecified: Secondary | ICD-10-CM | POA: Diagnosis not present

## 2021-04-04 DIAGNOSIS — R0602 Shortness of breath: Secondary | ICD-10-CM | POA: Diagnosis not present

## 2021-04-04 DIAGNOSIS — R011 Cardiac murmur, unspecified: Secondary | ICD-10-CM | POA: Diagnosis not present

## 2021-04-04 DIAGNOSIS — I10 Essential (primary) hypertension: Secondary | ICD-10-CM | POA: Diagnosis not present

## 2021-04-04 DIAGNOSIS — I480 Paroxysmal atrial fibrillation: Secondary | ICD-10-CM | POA: Diagnosis not present

## 2021-04-04 DIAGNOSIS — R001 Bradycardia, unspecified: Secondary | ICD-10-CM | POA: Diagnosis not present

## 2021-04-12 DIAGNOSIS — R079 Chest pain, unspecified: Secondary | ICD-10-CM | POA: Diagnosis not present

## 2021-04-16 DIAGNOSIS — R0602 Shortness of breath: Secondary | ICD-10-CM | POA: Diagnosis not present

## 2021-04-16 DIAGNOSIS — R001 Bradycardia, unspecified: Secondary | ICD-10-CM | POA: Diagnosis not present

## 2021-05-02 DIAGNOSIS — I420 Dilated cardiomyopathy: Secondary | ICD-10-CM | POA: Diagnosis not present

## 2021-05-02 DIAGNOSIS — I82501 Chronic embolism and thrombosis of unspecified deep veins of right lower extremity: Secondary | ICD-10-CM | POA: Diagnosis not present

## 2021-05-02 DIAGNOSIS — I10 Essential (primary) hypertension: Secondary | ICD-10-CM | POA: Diagnosis not present

## 2021-05-02 DIAGNOSIS — E7800 Pure hypercholesterolemia, unspecified: Secondary | ICD-10-CM | POA: Diagnosis not present

## 2021-05-02 DIAGNOSIS — N1831 Chronic kidney disease, stage 3a: Secondary | ICD-10-CM | POA: Diagnosis not present

## 2021-05-02 DIAGNOSIS — R001 Bradycardia, unspecified: Secondary | ICD-10-CM | POA: Diagnosis not present

## 2021-05-02 DIAGNOSIS — I480 Paroxysmal atrial fibrillation: Secondary | ICD-10-CM | POA: Diagnosis not present

## 2021-05-19 ENCOUNTER — Other Ambulatory Visit: Payer: Self-pay | Admitting: Family Medicine

## 2021-05-19 DIAGNOSIS — I10 Essential (primary) hypertension: Secondary | ICD-10-CM

## 2021-05-30 DIAGNOSIS — E7800 Pure hypercholesterolemia, unspecified: Secondary | ICD-10-CM | POA: Diagnosis not present

## 2021-05-30 DIAGNOSIS — N1831 Chronic kidney disease, stage 3a: Secondary | ICD-10-CM | POA: Diagnosis not present

## 2021-05-30 DIAGNOSIS — I82501 Chronic embolism and thrombosis of unspecified deep veins of right lower extremity: Secondary | ICD-10-CM | POA: Diagnosis not present

## 2021-05-30 DIAGNOSIS — I480 Paroxysmal atrial fibrillation: Secondary | ICD-10-CM | POA: Diagnosis not present

## 2021-05-30 DIAGNOSIS — I208 Other forms of angina pectoris: Secondary | ICD-10-CM | POA: Diagnosis not present

## 2021-05-30 DIAGNOSIS — I420 Dilated cardiomyopathy: Secondary | ICD-10-CM | POA: Diagnosis not present

## 2021-05-30 DIAGNOSIS — R0602 Shortness of breath: Secondary | ICD-10-CM | POA: Diagnosis not present

## 2021-05-30 DIAGNOSIS — I10 Essential (primary) hypertension: Secondary | ICD-10-CM | POA: Diagnosis not present

## 2021-05-30 DIAGNOSIS — R001 Bradycardia, unspecified: Secondary | ICD-10-CM | POA: Diagnosis not present

## 2021-06-08 ENCOUNTER — Ambulatory Visit: Payer: Self-pay

## 2021-06-08 NOTE — Telephone Encounter (Signed)
? ? ?Chief Complaint: COVID + ?Symptoms: Cough ?Frequency: Since 06/06/2021 ?Pertinent Negatives: Patient denies fever SOB ?Disposition: '[]'$ ED /'[x]'$ Urgent Care (no appt availability in office) / '[]'$ Appointment(In office/virtual)/ '[]'$  [facility]/ '[]'$ Home Care/ '[]'$ Refused Recommended Disposition /'[]'$ [place] [facility]/ '[]'$  Follow-up with PCP ?Additional Notes: Pt COVID + . No availability in office sent teams message. Recommended UC by Karene Fry. Pt will go to UC. ? ?Pt also had a fall last nigh - but no complaints regarding fall. ? ? ? ? ? Reason for Disposition ? [1] HIGH RISK for severe COVID complications (e.g., weak immune system, age > 83 years, obesity with BMI > 25, pregnant, chronic lung disease or other chronic medical condition) AND [2] COVID symptoms (e.g., cough, fever)  (Exceptions: Already seen by PCP and no new or worsening symptoms.) ? ?Answer Assessment - Initial Assessment Questions ?1. MECHANISM: "How did the fall happen?" ?    In the bathroom - slipped on rug ?2. DOMESTIC VIOLENCE AND ELDER ABUSE SCREENING: "Did you fall because someone pushed you or tried to hurt you?" If Yes, ask: "Are you safe now?" ?    na ?3. ONSET: "When did the fall happen?" (e.g., minutes, hours, or days ago) ?    unknown ?4. LOCATION: "What part of the body hit the ground?" (e.g., back, buttocks, head, hips, knees, hands, head, stomach) ?    side ?5. INJURY: "Did you hurt (injure) yourself when you fell?" If Yes, ask: "What did you injure? Tell me more about this?" (e.g., body area; type of injury; pain severity)" ?    no ?6. PAIN: "Is there any pain?" If Yes, ask: "How bad is the pain?" (e.g., Scale 1-10; or mild,  ?moderate, severe) ?  - NONE (0): No pain ?  - MILD (1-3): Doesn't interfere with normal activities  ?  - MODERATE (4-7): Interferes with normal activities or awakens from sleep  ?  - SEVERE (8-10): Excruciating pain, unable to do any normal activities  ?    5/10 ?7. SIZE: For cuts, bruises,  or swelling, ask: "How large is it?" (e.g., inches or centimeters)  ?    no ?8. PREGNANCY: "Is there any chance you are pregnant?" "When was your last menstrual period?" ?    na ?9. OTHER SYMPTOMS: "Do you have any other symptoms?" (e.g., dizziness, fever, weakness; new onset or worsening).  ?    no ?10. CAUSE: "What do you think caused the fall (or falling)?" (e.g., tripped, dizzy spell) ?      Slipped on rug ? ?Answer Assessment - Initial Assessment Questions ?1. COVID-19 DIAGNOSIS: "Who made your COVID-19 diagnosis?" "Was it confirmed by a positive lab test or self-test?" If not diagnosed by a doctor (or NP/PA), ask "Are there lots of cases (community spread) where you live?" Note: See public health department website, if unsure. ?    son ?2. COVID-19 EXPOSURE: "Was there any known exposure to COVID before the symptoms began?" CDC Definition of close contact: within 6 feet (2 meters) for a total of 15 minutes or more over a 24-hour period.  ?    na ?3. ONSET: "When did the COVID-19 symptoms start?"  ?    06/06/2021 ?4. WORST SYMPTOM: "What is your worst symptom?" (e.g., cough, fever, shortness of breath, muscle aches) ?    Runny nose and cough ?5. COUGH: "Do you have a cough?" If Yes, ask: "How bad is the cough?"   ?    Yes - not too bad ?  6. FEVER: "Do you have a fever?" If Yes, ask: "What is your temperature, how was it measured, and when did it start?" ?    no ?7. RESPIRATORY STATUS: "Describe your breathing?" (e.g., shortness of breath, wheezing, unable to speak)  ?    breathing ?8. BETTER-SAME-WORSE: "Are you getting better, staying the same or getting worse compared to yesterday?"  If getting worse, ask, "In what way?" ?    worse ?9. HIGH RISK DISEASE: "Do you have any chronic medical problems?" (e.g., asthma, heart or lung disease, weak immune system, obesity, etc.) ?    heart ?10. VACCINE: "Have you had the COVID-19 vaccine?" If Yes, ask: "Which one, how many shots, when did you get it?" ?       ?11.  BOOSTER: "Have you received your COVID-19 booster?" If Yes, ask: "Which one and when did you get it?" ?       ?12. PREGNANCY: "Is there any chance you are pregnant?" "When was your last menstrual period?" ?      na ?13. OTHER SYMPTOMS: "Do you have any other symptoms?"  (e.g., chills, fatigue, headache, loss of smell or taste, muscle pain, sore throat) ?      no ?14. O2 SATURATION MONITOR:  "Do you use an oxygen saturation monitor (pulse oximeter) at home?" If Yes, ask "What is your reading (oxygen level) today?" "What is your usual oxygen saturation reading?" (e.g., 95%) ?      na ? ?Protocols used: Falls and Falling-A-AH, Coronavirus (COVID-19) Diagnosed or Suspected-A-AH ? ?

## 2021-06-10 DIAGNOSIS — R0781 Pleurodynia: Secondary | ICD-10-CM | POA: Diagnosis not present

## 2021-06-11 NOTE — Telephone Encounter (Signed)
Lvm to call back

## 2021-06-19 NOTE — Progress Notes (Signed)
Name: Ronald Fleming   MRN: [id number]    DOB: August 27, 1935   Date:06/20/2021 ? ?     Progress Note ? ?Subjective ? ?Chief Complaint ? ?Follow Up ? ?HPI ? ?HTN: No chest pain he has occasional palpation. He denies dizziness, we went down on losartan dose from 25 mg to 50 mg due to low bp but he was seen by Dr. Clayborn Bigness and dose increased again. He denies orthostatic changes, advised to monitor it  ? ?Cardiomyopathy: under the care of Dr. Clayborn Bigness, I had decreased dose of losartan but he is back on '50mg'$  daily, and also on lasix due to cardiomyopathy with EF down to 40 %. He has SOB with activity, denies chest pain, he denies orthopnea.  ? ?INTERPRETATION 04/16/2021 ?MILD LV SYSTOLIC DYSFUNCTION (See above)   WITH MODERATE LVH  ?NORMAL RIGHT VENTRICULAR SYSTOLIC FUNCTION  ?MODERATE VALVULAR REGURGITATION (See above)  ?NO VALVULAR STENOSIS  ?IRREGULAR HEART RHYTHM CAPTURED THROUGHOUT EXAM  ?ESTIMATED LVEF 40%  ?Aortic: MODERATE AI  ?Mitral: MODERATE - SEVERE MR; MAX VEL. 4.67ms  ?Tricuspid: MODERATE TR (3.880m)  ?Pulmonic: MILD PI  ?MODERATE LAE  ?MILD RAE  ?MILDLY DILATED ASCENDING AORTA MEASURING 3.8cm  ?MILDLY DILATED AORTIC ROOT MEASURING 3.8cm  ? ?CKI stage III: stable, urine micro negative, denies pruritis, on ARB advised to stay off NSAID's. Last lab was stable - GFR at 52  ? ?Hyperlipidemia: taking statins, denies side effects, last LDL was down to 66  He also has atherosclerosis of aorta, is on statin therapy , off aspirin, but takes pradaxa daily. Continue medication  ?  ?Alzheimer's late onset: wife noticed he had memory loss, seeing Dr ShManuella Ghazi Neurologist ) and is now on Razadyne '12mg'$  twice daily e has been on medication since Fall 2017, he chose to stop medication. Since wife had brain surgery 2021 , both sons are now living in [place], [place] and [place] daily, cooking and helping around the house He is still able to perform ADL on his own, still pays his bills. Son's have access and  they monitor what he is doing financially. Unchanged ? ?Pre-diabetes: last hgbA1C was 5.7%  he denies polyphagia, polydipsia, he has BPH and takes lasix now.  ?  ?Afib: he states doing well, occasional palpitation and SOB with activity but denies chest pain,  he is on Pradaxa and denies easy bleeding or bruising. Sees Dr CaClayborn Bignesshe has been off aspirin because of risk of bleeding. He denies side effects of medications  ?  ?BPH: he has urinary frequency during the day,  he also has nocturia two to three times daily, but able to fall back asleep, he has night lights now,  he is on Flomax and sees Dr. StBernardo HeaterHe has a history of hematuria secondary to kidney stones but no recent episodes. He has noticed increase in urinary frequency since taking diuretic  ?  ?Pancreas cyst: found on CT for stone search, had repeat MRI 10/2018 and repeat was done 10/2019 . He has good appetite, no nausea, vomiting or abdominal pain. He is symptom free and does not want any further evaluation Unchanged  ? ?IMPRESSION: ?2.4 cm unilocular cystic lesion in the pancreatic body, showing ?progressive increase in size from 1.3 cm on 2019 exam. EUS/FNA ?should be considered for further evaluation. This recommendation ?follows ACR consensus guidelines: Management of Incidental ?Pancreatic Cysts: A White Paper of the ACR Incidental Findings ?Committee. J [id number]?  ?Chronic DVT  right leg, also has varicose veins, no pain or discomfort at this time, he has intermittent right lower extremity edema that resolves with compression stocking hoses . He is doing fine at this time, still on Pradaxa due to Afib ? ?Recurrent Falls: he tripped on the bathroom rug during the night and also had an episode of syncope recently, fell forward while standing. He did not seek medical assistance. His bp during recent urgent care was down to 106/57 and heart rate 54. I will send home health to the house for PT, and also discuss dose of  losartan with Dr. Clayborn Bigness  ? ?Recent COVID-19: before they left the room wife told me they had COVID-19 last week, he has lost 7 lbs in one week, appetite is finally getting better. They did a home test, he is still feeling weak, wondering if the fall was due to COVID-19 and possible dehydration but they didn't give me the exact dates of the events. Advised fluids and rest. Orthostatic vitals were normal  ? ?Patient Active Problem List  ? Diagnosis Date Noted  ? DDD (degenerative disc disease), lumbosacral 09/11/2017  ? Nephrolithiasis 09/11/2017  ? Vitamin D deficiency 09/11/2017  ? Lesion of pancreas 07/09/2017  ? Atherosclerosis of aorta ([place]) 07/01/2017  ? Osteopenia 05/21/2017  ? Atrioventricular block, second degree 05/29/2016  ? Vitamin B12 deficiency 05/21/2016  ? Late onset Alzheimer's disease without behavioral disturbance ([place]) 05/21/2016  ? Vitiligo 02/12/2016  ? BPH (benign prostatic hyperplasia) 08/03/2015  ? Atrial fibrillation ([place]) 02/01/2015  ? Edema leg 08/23/2014  ? Neuropathy 08/23/2014  ? Benign essential HTN 08/27/2006  ? Hypercholesterolemia without hypertriglyceridemia 08/27/2006  ? ? ?Past Surgical History:  ?Procedure Laterality Date  ? COLONOSCOPY    ? HERNIA REPAIR  11/09/2017  ? INGUINAL HERNIA REPAIR Right 11/10/2017  ? Procedure: LAPAROSCOPIC RIGHT  INGUINAL HERNIA REPAIR;  Surgeon: Ralene Ok, MD;  Location: [facility];  Service: General;  Laterality: Right;  ? INGUINAL HERNIA REPAIR Left 02/23/2020  ? Procedure: LAPAROSCOPIC LEFT  INGUINAL HERNIA WITH MESH;  Surgeon: Ralene Ok, MD;  Location: [facility];  Service: General;  Laterality: Left;  ? INSERTION OF MESH Right 11/10/2017  ? Procedure: INSERTION OF MESH;  Surgeon: Ralene Ok, MD;  Location: [facility];  Service: General;  Laterality: Right;  ? ? ?Family History  ?Problem Relation Age of Onset  ? Coronary artery disease Father   ? Stroke Son   ? Kidney disease Neg Hx   ? Prostate cancer Neg Hx   ? Bladder Cancer Neg Hx    ? ? ?Social History  ? ?Tobacco Use  ? Smoking status: Never  ? Smokeless tobacco: Never  ?Substance Use Topics  ? Alcohol use: Never  ?  Alcohol/week: 0.0 standard drinks  ? ? ? ?Current Outpatient Medications:  ?  atorvastatin (LIPITOR) 20 MG tablet, TAKE 1 TABLET EVERY DAY, Disp: 90 tablet, Rfl: 1 ?  Cholecalciferol (VITAMIN D) 50 MCG (2000 UT) CAPS, Take 1 capsule (2,000 Units total) by mouth daily., Disp: 100 capsule, Rfl: 3 ?  dabigatran (PRADAXA) 150 MG CAPS capsule, Take 1 capsule (150 mg total) by mouth 2 (two) times daily., Disp: 180 capsule, Rfl: 1 ?  losartan (COZAAR) 25 MG tablet, TAKE 1 TABLET EVERY DAY, Disp: 90 tablet, Rfl: 0 ?  tamsulosin (FLOMAX) 0.4 MG CAPS capsule, TAKE 1 CAPSULE EVERY DAY, Disp: 90 capsule, Rfl: 1 ?  vitamin B-12 (CYANOCOBALAMIN) 1000 MCG tablet, Take 2,000 mcg by mouth 3 (three) times a  week. , Disp: , Rfl:  ? ?No Known Allergies ? ?I personally reviewed active problem list, medication list, allergies, family history, social history, health maintenance with the patient/caregiver today. ? ? ?ROS ? ?Ten systems reviewed and is negative except as mentioned in HPI  ? ?Objective ? ?Vitals:  ? 06/20/21 1025  ?BP: 112/72  ?Pulse: 82  ?Resp: 16  ?SpO2: 100%  ?Weight: 171 lb (77.6 kg)  ?Height: '5\' 11"'$  (1.803 m)  ? ? ?Body mass index is 23.85 kg/m?. ? ?Physical Exam ? ?Constitutional: Patient appears well-developed and well-nourished.  No distress.  ?HEENT: head atraumatic, normocephalic, pupils equal and reactive to light, neck supple ?Cardiovascular: Normal rate, regular rhythm and normal heart sounds.  No murmur heard. No BLE edema. ?Pulmonary/Chest: Effort normal and breath sounds normal. No respiratory distress. ?Abdominal: Soft.  There is no tenderness. ?Psychiatric: Patient has a normal mood and affect. behavior is normal. Judgment and thought content normal.  ? ?PHQ2/9: ? ?  06/20/2021  ? 10:24 AM 03/01/2021  ?  9:34 AM 02/19/2021  ? 10:01 AM 10/20/2020  ? 11:03 AM 04/21/2020  ? 11:09  AM  ?Depression screen PHQ 2/9  ?Decreased Interest 0 0 0 0 0  ?Down, Depressed, Hopeless 0 0 0 0 0  ?PHQ - 2 Score 0 0 0 0 0  ?Altered sleeping 0  0    ?Tired, decreased energy 0  0    ?Change in appetite 2  0

## 2021-06-20 ENCOUNTER — Encounter: Payer: Self-pay | Admitting: Family Medicine

## 2021-06-20 ENCOUNTER — Ambulatory Visit (INDEPENDENT_AMBULATORY_CARE_PROVIDER_SITE_OTHER): Payer: Medicare PPO | Admitting: Family Medicine

## 2021-06-20 VITALS — BP 112/72 | HR 82 | Resp 16 | Ht 71.0 in | Wt 171.0 lb

## 2021-06-20 DIAGNOSIS — D696 Thrombocytopenia, unspecified: Secondary | ICD-10-CM | POA: Diagnosis not present

## 2021-06-20 DIAGNOSIS — I480 Paroxysmal atrial fibrillation: Secondary | ICD-10-CM

## 2021-06-20 DIAGNOSIS — I420 Dilated cardiomyopathy: Secondary | ICD-10-CM | POA: Diagnosis not present

## 2021-06-20 DIAGNOSIS — G301 Alzheimer's disease with late onset: Secondary | ICD-10-CM

## 2021-06-20 DIAGNOSIS — E538 Deficiency of other specified B group vitamins: Secondary | ICD-10-CM

## 2021-06-20 DIAGNOSIS — R3915 Urgency of urination: Secondary | ICD-10-CM

## 2021-06-20 DIAGNOSIS — E441 Mild protein-calorie malnutrition: Secondary | ICD-10-CM

## 2021-06-20 DIAGNOSIS — R296 Repeated falls: Secondary | ICD-10-CM

## 2021-06-20 DIAGNOSIS — N1831 Chronic kidney disease, stage 3a: Secondary | ICD-10-CM

## 2021-06-20 DIAGNOSIS — I700 Atherosclerosis of aorta: Secondary | ICD-10-CM

## 2021-06-20 DIAGNOSIS — E559 Vitamin D deficiency, unspecified: Secondary | ICD-10-CM | POA: Diagnosis not present

## 2021-06-20 DIAGNOSIS — I82511 Chronic embolism and thrombosis of right femoral vein: Secondary | ICD-10-CM

## 2021-06-20 DIAGNOSIS — N401 Enlarged prostate with lower urinary tract symptoms: Secondary | ICD-10-CM

## 2021-06-20 DIAGNOSIS — F0280 Dementia in other diseases classified elsewhere without behavioral disturbance: Secondary | ICD-10-CM

## 2021-06-20 DIAGNOSIS — Z23 Encounter for immunization: Secondary | ICD-10-CM

## 2021-06-20 DIAGNOSIS — E7800 Pure hypercholesterolemia, unspecified: Secondary | ICD-10-CM

## 2021-06-20 DIAGNOSIS — I10 Essential (primary) hypertension: Secondary | ICD-10-CM

## 2021-06-20 MED ORDER — TETANUS-DIPHTH-ACELL PERTUSSIS 5-2-15.5 LF-MCG/0.5 IM SUSP: 0.5000 mL | Freq: Once | INTRAMUSCULAR | 0 refills | Status: AC

## 2021-06-20 MED ORDER — SHINGRIX 50 MCG/0.5ML IM SUSR: 0.5000 mL | Freq: Once | INTRAMUSCULAR | 1 refills | Status: AC

## 2021-06-20 MED ORDER — LOSARTAN POTASSIUM 25 MG PO TABS: 25.0000 mg | ORAL_TABLET | Freq: Every day | ORAL | 0 refills | Status: DC

## 2021-06-20 MED ORDER — TAMSULOSIN HCL 0.4 MG PO CAPS: 0.4000 mg | ORAL_CAPSULE | Freq: Every day | ORAL | 1 refills | Status: DC

## 2021-06-20 MED ORDER — ATORVASTATIN CALCIUM 20 MG PO TABS: 20.0000 mg | ORAL_TABLET | Freq: Every day | ORAL | 1 refills | Status: DC

## 2021-06-29 ENCOUNTER — Telehealth: Payer: Self-pay | Admitting: Family Medicine

## 2021-06-29 NOTE — Telephone Encounter (Signed)
Home Health Verbal Orders - Caller/Agency: Willis Modena from [facility]  ?Callback Number: [phone] ?Requesting OT/PT/Skilled Nursing/Social Work/Speech Therapy: Requesting further orders for more visits  ?Frequency:  ? ?1w1 ?2w2 ?1w1 ?39month  ? ?For cardiac pulmonary assessments which include essential hypertension and AFIB.  ? ?Reporting a low heart rate of 50 and blood pressure 110/60  ? ? ? ?

## 2021-07-04 ENCOUNTER — Telehealth: Payer: Self-pay

## 2021-07-04 NOTE — Telephone Encounter (Signed)
Copied from [place] [id number]. Topic: Quick Communication - Home Health Verbal Orders ?>> Jul 03, 2021  3:49 PM McGill, Nelva Bush wrote: ?Caller/Agency: [facility] ?Callback Number:  [phone] ?Requesting OT/PT/Skilled Nursing/Social Work/Speech Therapy: PT ?Frequency: 1w2, ?

## 2021-07-04 NOTE — Telephone Encounter (Signed)
Verbal order given per Dr. Sowles.  ?

## 2021-07-13 DIAGNOSIS — R001 Bradycardia, unspecified: Secondary | ICD-10-CM | POA: Diagnosis not present

## 2021-07-18 DIAGNOSIS — G4733 Obstructive sleep apnea (adult) (pediatric): Secondary | ICD-10-CM | POA: Diagnosis not present

## 2021-07-18 DIAGNOSIS — R001 Bradycardia, unspecified: Secondary | ICD-10-CM | POA: Diagnosis not present

## 2021-07-19 ENCOUNTER — Telehealth: Payer: Self-pay

## 2021-07-19 NOTE — Telephone Encounter (Signed)
Copied from [place] [id number]. Topic: General - Other ?>> Jul 19, 2021  3:21 PM Oneta Rack wrote: ?Caller reporting HR 40 APICAL at lunch time asymptomatic, this morning HR was 33 APICAL. Patient was seen at the cardiologist, EKG was normal yesterday and cut losartan. Caller would like a follow up if PCP would like to make changes ?

## 2021-07-20 ENCOUNTER — Ambulatory Visit (INDEPENDENT_AMBULATORY_CARE_PROVIDER_SITE_OTHER): Payer: Medicare PPO | Admitting: Family Medicine

## 2021-07-20 ENCOUNTER — Encounter: Payer: Self-pay | Admitting: Family Medicine

## 2021-07-20 VITALS — BP 126/74 | HR 52 | Resp 16 | Ht 71.0 in | Wt 176.0 lb

## 2021-07-20 DIAGNOSIS — R001 Bradycardia, unspecified: Secondary | ICD-10-CM

## 2021-07-20 DIAGNOSIS — I480 Paroxysmal atrial fibrillation: Secondary | ICD-10-CM | POA: Diagnosis not present

## 2021-07-20 NOTE — Progress Notes (Signed)
Name: Ronald Fleming   MRN: [id number]    DOB: 26-Oct-1935   Date:07/20/2021 ? ?     Progress Note ? ?Subjective ? ?Chief Complaint ? ?Bradycardia ? ?HPI ? ?Bradycardia: patient has home health and noticed his heart rate was low, he followed up with Dr. Clayborn Bigness two days ago and heart rate on arrival was 38 but on EKG - per wife - heart rate was in the 70's. Patient has been asymptomatic. He denies chest pain, palpitation or syncope. Tabitha from [facility] called [place] yesterday to ask for a follow up so patient is here today ? ?Patient Active Problem List  ? Diagnosis Date Noted  ? DDD (degenerative disc disease), lumbosacral 09/11/2017  ? Nephrolithiasis 09/11/2017  ? Vitamin D deficiency 09/11/2017  ? Lesion of pancreas 07/09/2017  ? Atherosclerosis of aorta ([place]) 07/01/2017  ? Osteopenia 05/21/2017  ? Atrioventricular block, second degree 05/29/2016  ? Vitamin B12 deficiency 05/21/2016  ? Late onset Alzheimer's disease without behavioral disturbance ([place]) 05/21/2016  ? Vitiligo 02/12/2016  ? BPH (benign prostatic hyperplasia) 08/03/2015  ? Atrial fibrillation ([place]) 02/01/2015  ? Edema leg 08/23/2014  ? Neuropathy 08/23/2014  ? Benign essential HTN 08/27/2006  ? Hypercholesterolemia without hypertriglyceridemia 08/27/2006  ? ? ?Past Surgical History:  ?Procedure Laterality Date  ? COLONOSCOPY    ? HERNIA REPAIR  11/09/2017  ? INGUINAL HERNIA REPAIR Right 11/10/2017  ? Procedure: LAPAROSCOPIC RIGHT  INGUINAL HERNIA REPAIR;  Surgeon: Ralene Ok, MD;  Location: [facility];  Service: General;  Laterality: Right;  ? INGUINAL HERNIA REPAIR Left 02/23/2020  ? Procedure: LAPAROSCOPIC LEFT  INGUINAL HERNIA WITH MESH;  Surgeon: Ralene Ok, MD;  Location: [facility];  Service: General;  Laterality: Left;  ? INSERTION OF MESH Right 11/10/2017  ? Procedure: INSERTION OF MESH;  Surgeon: Ralene Ok, MD;  Location: [facility];  Service: General;  Laterality: Right;  ? ? ?Family History  ?Problem Relation Age of Onset  ? Coronary artery  disease Father   ? Stroke Son   ? Kidney disease Neg Hx   ? Prostate cancer Neg Hx   ? Bladder Cancer Neg Hx   ? ? ?Social History  ? ?Tobacco Use  ? Smoking status: Never  ? Smokeless tobacco: Never  ?Substance Use Topics  ? Alcohol use: Never  ?  Alcohol/week: 0.0 standard drinks  ? ? ? ?Current Outpatient Medications:  ?  atorvastatin (LIPITOR) 20 MG tablet, Take 1 tablet (20 mg total) by mouth daily., Disp: 90 tablet, Rfl: 1 ?  Cholecalciferol (VITAMIN D) 50 MCG (2000 UT) CAPS, Take 1 capsule (2,000 Units total) by mouth daily., Disp: 100 capsule, Rfl: 3 ?  dabigatran (PRADAXA) 150 MG CAPS capsule, Take 1 capsule (150 mg total) by mouth 2 (two) times daily., Disp: 180 capsule, Rfl: 1 ?  furosemide (LASIX) 20 MG tablet, Take 1 tablet by mouth in the morning., Disp: , Rfl:  ?  losartan (COZAAR) 25 MG tablet, Take 1 tablet (25 mg total) by mouth daily., Disp: 90 tablet, Rfl: 0 ?  tamsulosin (FLOMAX) 0.4 MG CAPS capsule, Take 1 capsule (0.4 mg total) by mouth daily., Disp: 90 capsule, Rfl: 1 ?  vitamin B-12 (CYANOCOBALAMIN) 1000 MCG tablet, Take 2,000 mcg by mouth 3 (three) times a week. , Disp: , Rfl:  ? ?No Known Allergies ? ?I personally reviewed active problem list, medication list, allergies, family history, social history, health maintenance with the patient/caregiver today. ? ? ?ROS ? ?Ten systems reviewed and is negative except  as mentioned in HPI ? ?Objective ? ?Vitals:  ? 07/20/21 1438  ?BP: 126/74  ?Pulse: (!) 52  ?Resp: 16  ?SpO2: 98%  ?Weight: 176 lb (79.8 kg)  ?Height: '5\' 11"'$  (1.803 m)  ? ? ?Body mass index is 24.55 kg/m?. ? ?Physical Exam ? ?Constitutional: Patient appears well-developed and thin  No distress.  ?HEENT: head atraumatic, normocephalic, pupils equal and reactive to light, neck supple ?Cardiovascular: Normal rate, irregular rhythm and normal heart sounds.  No murmur heard. No BLE edema. ?Pulmonary/Chest: Effort normal and breath sounds normal. No respiratory distress. ?Abdominal: Soft.   There is no tenderness. ?Psychiatric: Patient has a normal mood and affect. behavior is normal. Judgment and thought content normal.  ? ?PHQ2/9: ? ?  07/20/2021  ?  2:38 PM 06/20/2021  ? 10:24 AM 03/01/2021  ?  9:34 AM 02/19/2021  ? 10:01 AM 10/20/2020  ? 11:03 AM  ?Depression screen PHQ 2/9  ?Decreased Interest 0 0 0 0 0  ?Down, Depressed, Hopeless 0 0 0 0 0  ?PHQ - 2 Score 0 0 0 0 0  ?Altered sleeping 0 0  0   ?Tired, decreased energy 0 0  0   ?Change in appetite 0 2  0   ?Feeling bad or failure about yourself  0 0  0   ?Trouble concentrating 0 0  0   ?Moving slowly or fidgety/restless 0 0  0   ?Suicidal thoughts 0 0  0   ?PHQ-9 Score 0 2  0   ?  ?phq 9 is negative ? ? ?Fall Risk: ? ?  07/20/2021  ?  2:37 PM 06/20/2021  ? 10:24 AM 03/01/2021  ?  9:35 AM 02/19/2021  ? 10:01 AM 10/20/2020  ? 11:03 AM  ?Fall Risk   ?Falls in the past year? 1 1 0 0 0  ?Number falls in past yr: 1 1 0 0 0  ?Injury with Fall? 0 1 0 0 0  ?Risk for fall due to : Impaired mobility Impaired balance/gait No Fall Risks No Fall Risks   ?Follow up Falls prevention discussed Falls prevention discussed Falls prevention discussed Falls prevention discussed   ? ? ? ?Functional Status Survey: ?Is the patient deaf or have difficulty hearing?: Yes ?Does the patient have difficulty seeing, even when wearing glasses/contacts?: No ?Does the patient have difficulty concentrating, remembering, or making decisions?: Yes ?Does the patient have difficulty walking or climbing stairs?: Yes ?Does the patient have difficulty dressing or bathing?: No ?Does the patient have difficulty doing errands alone such as visiting a doctor's office or shopping?: No ? ? ? ?Assessment & Plan ? ?1. Paroxysmal atrial fibrillation (HCC) ? ?Rate controlled ? ?2. Bradycardia ?  ?He was evaluated by cardiologist 2 days ago, seems like [place] is concerned. Radial pulse is slower than her cardiac frequency and he is asymptomatic. Reassurance given  ?

## 2021-07-31 NOTE — Progress Notes (Signed)
Name: Ronald Fleming   MRN: [id number]    DOB: 1935/08/31   Date:08/01/2021 ? ?     Progress Note ? ?Subjective ? ?Chief Complaint ? ?Follow up  ? ?HPI ? ?Bradycardia: patient has home health and noticed his heart rate was low, he followed up with Dr. Clayborn Bigness  and heart rate on arrival was 38 but on EKG - per wife - heart rate was in the 70's. Patient has been asymptomatic. He denies chest pain, palpitation or syncope. Tabitha from [place] contacted [place] last month to make sure he was okay, during visit heart rate was good. He is still frail, but getting PT at home and using a cane to assist with balance. BP at home has been 126-140's/65-70's - heart rate has also 36-111, usually in the 70's range.  ? ?Malnutrition: His weight has improved, baseline of 180 lbs, Lost down to 171 lbs during COVID episode April 2023  but is up to 177 lbs now , appetite is back to normal  ? ?OA both knees with gait instability: getting PT and doing leg strengthening exercise , no recent falls, using a cane  ? ?Excoriation leg: he works in his yard/rose bushes and has excoriation on his leg, due for Tdap ? ?Patient Active Problem List  ? Diagnosis Date Noted  ? DDD (degenerative disc disease), lumbosacral 09/11/2017  ? Nephrolithiasis 09/11/2017  ? Vitamin D deficiency 09/11/2017  ? Lesion of pancreas 07/09/2017  ? Atherosclerosis of aorta ([place]) 07/01/2017  ? Osteopenia 05/21/2017  ? Atrioventricular block, second degree 05/29/2016  ? Vitamin B12 deficiency 05/21/2016  ? Late onset Alzheimer's disease without behavioral disturbance ([place]) 05/21/2016  ? Vitiligo 02/12/2016  ? BPH (benign prostatic hyperplasia) 08/03/2015  ? Atrial fibrillation ([place]) 02/01/2015  ? Edema leg 08/23/2014  ? Neuropathy 08/23/2014  ? Benign essential HTN 08/27/2006  ? Hypercholesterolemia without hypertriglyceridemia 08/27/2006  ? ? ?Past Surgical History:  ?Procedure Laterality Date  ? COLONOSCOPY    ? HERNIA REPAIR  11/09/2017  ? INGUINAL HERNIA REPAIR Right  11/10/2017  ? Procedure: LAPAROSCOPIC RIGHT  INGUINAL HERNIA REPAIR;  Surgeon: Ralene Ok, MD;  Location: [facility];  Service: General;  Laterality: Right;  ? INGUINAL HERNIA REPAIR Left 02/23/2020  ? Procedure: LAPAROSCOPIC LEFT  INGUINAL HERNIA WITH MESH;  Surgeon: Ralene Ok, MD;  Location: [facility];  Service: General;  Laterality: Left;  ? INSERTION OF MESH Right 11/10/2017  ? Procedure: INSERTION OF MESH;  Surgeon: Ralene Ok, MD;  Location: [facility];  Service: General;  Laterality: Right;  ? ? ?Family History  ?Problem Relation Age of Onset  ? Coronary artery disease Father   ? Stroke Son   ? Kidney disease Neg Hx   ? Prostate cancer Neg Hx   ? Bladder Cancer Neg Hx   ? ? ?Social History  ? ?Tobacco Use  ? Smoking status: Never  ? Smokeless tobacco: Never  ?Substance Use Topics  ? Alcohol use: Never  ?  Alcohol/week: 0.0 standard drinks  ? ? ? ?Current Outpatient Medications:  ?  atorvastatin (LIPITOR) 20 MG tablet, Take 1 tablet (20 mg total) by mouth daily., Disp: 90 tablet, Rfl: 1 ?  Cholecalciferol (VITAMIN D) 50 MCG (2000 UT) CAPS, Take 1 capsule (2,000 Units total) by mouth daily., Disp: 100 capsule, Rfl: 3 ?  dabigatran (PRADAXA) 150 MG CAPS capsule, Take 1 capsule (150 mg total) by mouth 2 (two) times daily., Disp: 180 capsule, Rfl: 1 ?  furosemide (LASIX) 20 MG tablet, Take  1 tablet by mouth in the morning., Disp: , Rfl:  ?  losartan (COZAAR) 25 MG tablet, Take 1 tablet (25 mg total) by mouth daily., Disp: 90 tablet, Rfl: 0 ?  tamsulosin (FLOMAX) 0.4 MG CAPS capsule, Take 1 capsule (0.4 mg total) by mouth daily., Disp: 90 capsule, Rfl: 1 ?  vitamin B-12 (CYANOCOBALAMIN) 1000 MCG tablet, Take 2,000 mcg by mouth 3 (three) times a week. , Disp: , Rfl:  ? ?No Known Allergies ? ?I personally reviewed active problem list, medication list, allergies, family history with the patient/caregiver today. ? ? ?ROS ? ?Ten systems reviewed and is negative except as mentioned in HPI  ? ?Objective ? ?Vitals:  ?  08/01/21 1028  ?BP: 128/78  ?Pulse: 65  ?Resp: 16  ?Temp: 97.9 ?F (36.6 ?C)  ?TempSrc: Oral  ?SpO2: 96%  ?Weight: 177 lb 14.4 oz (80.7 kg)  ?Height: '5\' 11"'$  (1.803 m)  ? ? ?Body mass index is 24.81 kg/m?. ? ?Physical Exam ? ?Constitutional: Patient appears well-developed and well-nourished. Obese  No distress.  ?HEENT: head atraumatic, normocephalic, pupils equal and reactive to light, neck supple ?Cardiovascular: Normal rate, regular rhythm and normal heart sounds.  No murmur heard. No BLE edema. ?Pulmonary/Chest: Effort normal and breath sounds normal. No respiratory distress. ?Abdominal: Soft.  There is no tenderness. ?Psychiatric: Patient has a normal mood and affect. behavior is normal. Judgment and thought content normal.  ? ? ?PHQ2/9: ? ?  08/01/2021  ? 10:30 AM 07/20/2021  ?  2:38 PM 06/20/2021  ? 10:24 AM 03/01/2021  ?  9:34 AM 02/19/2021  ? 10:01 AM  ?Depression screen PHQ 2/9  ?Decreased Interest 0 0 0 0 0  ?Down, Depressed, Hopeless 0 0 0 0 0  ?PHQ - 2 Score 0 0 0 0 0  ?Altered sleeping 0 0 0  0  ?Tired, decreased energy 0 0 0  0  ?Change in appetite 0 0 2  0  ?Feeling bad or failure about yourself  0 0 0  0  ?Trouble concentrating 0 0 0  0  ?Moving slowly or fidgety/restless 0 0 0  0  ?Suicidal thoughts 0 0 0  0  ?PHQ-9 Score 0 0 2  0  ?  ?phq 9 is negative ? ? ?Fall Risk: ? ?  08/01/2021  ? 10:30 AM 07/20/2021  ?  2:37 PM 06/20/2021  ? 10:24 AM 03/01/2021  ?  9:35 AM 02/19/2021  ? 10:01 AM  ?Fall Risk   ?Falls in the past year? '1 1 1 '$ 0 0  ?Number falls in past yr: '1 1 1 '$ 0 0  ?Injury with Fall? 0 0 1 0 0  ?Risk for fall due to : History of fall(s);Impaired balance/gait Impaired mobility Impaired balance/gait No Fall Risks No Fall Risks  ?Follow up Falls prevention discussed;Education provided;Falls evaluation completed Falls prevention discussed Falls prevention discussed Falls prevention discussed Falls prevention discussed  ? ? ? ? ?Functional Status Survey: ?Is the patient deaf or have difficulty hearing?:  Yes ?Does the patient have difficulty seeing, even when wearing glasses/contacts?: No ?Does the patient have difficulty concentrating, remembering, or making decisions?: No ?Does the patient have difficulty walking or climbing stairs?: Yes ?Does the patient have difficulty dressing or bathing?: No ?Does the patient have difficulty doing errands alone such as visiting a doctor's office or shopping?: No ? ? ? ?Assessment & Plan ? ?Problem List Items Addressed This Visit   ? ? Atrial fibrillation ([place]) - Primary  ?  Rate controlled and asymptomatic bradycardia ? ?  ?  ? Mild protein-calorie malnutrition ([place])  ?  Due to covid 04/22 , weight is trending up  ? ?  ?  ? Gait instability  ?  Continue home PT  ? ?  ?  ? Primary osteoarthritis of both knees  ? ?Other Visit Diagnoses   ? ? Skin excoriation      ? Relevant Orders  ? Tdap vaccine greater than or equal to 7yo IM (Completed)  ? Need for Tdap vaccination      ? Relevant Orders  ? Tdap vaccine greater than or equal to 7yo IM (Completed)  ? ?  ? ? ? ?

## 2021-08-01 ENCOUNTER — Ambulatory Visit: Payer: Medicare PPO | Admitting: Family Medicine

## 2021-08-01 ENCOUNTER — Encounter: Payer: Self-pay | Admitting: Family Medicine

## 2021-08-01 VITALS — BP 128/78 | HR 65 | Temp 97.9°F | Resp 16 | Ht 71.0 in | Wt 177.9 lb

## 2021-08-01 DIAGNOSIS — Z23 Encounter for immunization: Secondary | ICD-10-CM

## 2021-08-01 DIAGNOSIS — M170 Bilateral primary osteoarthritis of knee: Secondary | ICD-10-CM | POA: Diagnosis not present

## 2021-08-01 DIAGNOSIS — E441 Mild protein-calorie malnutrition: Secondary | ICD-10-CM | POA: Insufficient documentation

## 2021-08-01 DIAGNOSIS — T148XXA Other injury of unspecified body region, initial encounter: Secondary | ICD-10-CM

## 2021-08-01 DIAGNOSIS — I480 Paroxysmal atrial fibrillation: Secondary | ICD-10-CM | POA: Diagnosis not present

## 2021-08-01 DIAGNOSIS — R2681 Unsteadiness on feet: Secondary | ICD-10-CM | POA: Insufficient documentation

## 2021-08-01 NOTE — Assessment & Plan Note (Signed)
Due to covid 04/22 , weight is trending up  ?

## 2021-08-01 NOTE — Assessment & Plan Note (Signed)
Rate controlled and asymptomatic bradycardia ?

## 2021-08-01 NOTE — Assessment & Plan Note (Signed)
Continue home PT

## 2021-08-10 ENCOUNTER — Other Ambulatory Visit: Payer: Self-pay | Admitting: Family Medicine

## 2021-08-10 DIAGNOSIS — E7800 Pure hypercholesterolemia, unspecified: Secondary | ICD-10-CM

## 2021-09-19 ENCOUNTER — Encounter: Payer: Self-pay | Admitting: Nurse Practitioner

## 2021-09-19 ENCOUNTER — Ambulatory Visit (INDEPENDENT_AMBULATORY_CARE_PROVIDER_SITE_OTHER): Payer: Medicare PPO | Admitting: Nurse Practitioner

## 2021-09-19 VITALS — BP 128/72 | HR 63 | Temp 97.8°F | Resp 16 | Ht 71.0 in | Wt 174.3 lb

## 2021-09-19 DIAGNOSIS — T560X1A Toxic effect of lead and its compounds, accidental (unintentional), initial encounter: Secondary | ICD-10-CM | POA: Diagnosis not present

## 2021-09-19 DIAGNOSIS — M10172 Lead-induced gout, left ankle and foot: Secondary | ICD-10-CM | POA: Diagnosis not present

## 2021-09-19 MED ORDER — INDOMETHACIN 25 MG PO CAPS: 25.0000 mg | ORAL_CAPSULE | Freq: Three times a day (TID) | ORAL | 0 refills | Status: AC | PRN

## 2021-09-19 NOTE — Progress Notes (Signed)
BP 128/72   Pulse 63   Temp 97.8 F (36.6 C)   Resp 16   Ht _0  (1.803 m)   Wt 174 lb 4.8 oz (79.1 kg)   SpO2 97%   BMI 24.31 kg/m    Subjective:    Patient ID: Ronald Fleming, male    DOB: [birth year], 86 y.o.   MRN: [id number]  HPI: Ronald Fleming is a 86 y.o. male  Chief Complaint  Patient presents with   Gout    Left foot   Gout: Left great toe swelling and redness noted on Saturday or Sunday.  He denies any injury. Will get uric acid and treat with NSAID.  Discussed dietary changes to prevent flares.    Relevant past medical, surgical, family and social history reviewed and updated as indicated. Interim medical history since our last visit reviewed. Allergies and medications reviewed and updated.  Review of Systems  Constitutional: Negative for fever or weight change.  Respiratory: Negative for cough and shortness of breath.   Cardiovascular: Negative for chest pain or palpitations.  Gastrointestinal: Negative for abdominal pain, no bowel changes.  Musculoskeletal: Negative for gait problem or joint swelling.  Positive for left great toe pain Skin: Negative for rash.  Neurological: Negative for dizziness or headache.  No other specific complaints in a complete review of systems (except as listed in HPI above).      Objective:    BP 128/72   Pulse 63   Temp 97.8 F (36.6 C)   Resp 16   Ht _1  (1.803 m)   Wt 174 lb 4.8 oz (79.1 kg)   SpO2 97%   BMI 24.31 kg/m   Wt Readings from Last 3 Encounters:  09/19/21 174 lb 4.8 oz (79.1 kg)  08/01/21 177 lb 14.4 oz (80.7 kg)  07/20/21 176 lb (79.8 kg)    Physical Exam  Constitutional: Patient appears well-developed and well-nourished.  No distress.  HEENT: head atraumatic, normocephalic, pupils equal and reactive to light,  neck supple Cardiovascular: Normal rate, regular rhythm and normal heart sounds.  No murmur heard. No BLE edema. Pulmonary/Chest: Effort normal and breath sounds normal. No respiratory  distress. Abdominal: Soft.  There is no tenderness. MSK: left great toe redness and swelling Psychiatric: Patient has a normal mood and affect. behavior is normal. Judgment and thought content normal.  Results for orders placed or performed in visit on 10/20/20  Lipid panel  Result Value Ref Range   Cholesterol 142 <200 mg/dL   HDL 62 > OR = 40 mg/dL   Triglycerides 59 <150 mg/dL   LDL Cholesterol (Calc) 66 mg/dL (calc)   Total CHOL/HDL Ratio 2.3 <5.0 (calc)   Non-HDL Cholesterol (Calc) 80 <130 mg/dL (calc)  Microalbumin / creatinine urine ratio  Result Value Ref Range   Creatinine, Urine 180 20 - 320 mg/dL   Microalb, Ur 0.9 mg/dL   Microalb Creat Ratio 5 <30 mcg/mg creat  CBC with Differential/Platelet  Result Value Ref Range   WBC 9.7 3.8 - 10.8 Thousand/uL   RBC 4.57 4.20 - 5.80 Million/uL   Hemoglobin 15.0 13.2 - 17.1 g/dL   HCT 44.4 38.5 - 50.0 %   MCV 97.2 80.0 - 100.0 fL   MCH 32.8 27.0 - 33.0 pg   MCHC 33.8 32.0 - 36.0 g/dL   RDW 11.7 11.0 - 15.0 %   Platelets 141 140 - 400 Thousand/uL   MPV 11.9 7.5 - 12.5 fL   Neutro Abs 7,886 (  H) 1,500 - 7,800 cells/uL   Lymphs Abs 1,019 850 - 3,900 cells/uL   Absolute Monocytes 737 200 - 950 cells/uL   Eosinophils Absolute 39 15 - 500 cells/uL   Basophils Absolute 19 0 - 200 cells/uL   Neutrophils Relative % 81.3 %   Total Lymphocyte 10.5 %   Monocytes Relative 7.6 %   Eosinophils Relative 0.4 %   Basophils Relative 0.2 %  COMPLETE METABOLIC PANEL WITH GFR  Result Value Ref Range   Glucose, Bld 91 65 - 99 mg/dL   BUN 16 7 - 25 mg/dL   Creat 1.37 (H) 0.70 - 1.22 mg/dL   eGFR 52 (L) > OR = 60 mL/min/1.47m   BUN/Creatinine Ratio 12 6 - 22 (calc)   Sodium 141 135 - 146 mmol/L   Potassium 3.8 3.5 - 5.3 mmol/L   Chloride 106 98 - 110 mmol/L   CO2 25 20 - 32 mmol/L   Calcium 8.7 8.6 - 10.3 mg/dL   Total Protein 6.7 6.1 - 8.1 g/dL   Albumin 4.0 3.6 - 5.1 g/dL   Globulin 2.7 1.9 - 3.7 g/dL (calc)   AG Ratio 1.5 1.0 - 2.5  (calc)   Total Bilirubin 1.2 0.2 - 1.2 mg/dL   Alkaline phosphatase (APISO) 75 35 - 144 U/L   AST 14 10 - 35 U/L   ALT 19 9 - 46 U/L  Hemoglobin A1c  Result Value Ref Range   Hgb A1c MFr Bld 5.7 (H) <5.7 % of total Hgb   Mean Plasma Glucose 117 mg/dL   eAG (mmol/L) 6.5 mmol/L  VITAMIN D 25 Hydroxy (Vit-D Deficiency, Fractures)  Result Value Ref Range   Vit D, 25-Hydroxy 25 (L) 30 - 100 ng/mL  Vitamin B12  Result Value Ref Range   Vitamin B-12 1,230 (H) 200 - 1,100 pg/mL      Assessment & Plan:   Problem List Items Addressed This Visit   None Visit Diagnoses     Acute lead-induced gout involving toe of left foot, initial encounter    -  Primary   We will treat with NSAID.  We will also obtain uric acid.  Discussed dietary adjustments to prevent future flares.  Also discussed using cold packs   Relevant Medications   indomethacin (INDOCIN) 25 MG capsule   Other Relevant Orders   Uric acid        Follow up plan: Return if symptoms worsen or fail to improve.

## 2021-09-20 LAB — URIC ACID: Uric Acid, Serum: 6.8 mg/dL (ref 4.0–8.0)

## 2021-09-29 DIAGNOSIS — M109 Gout, unspecified: Secondary | ICD-10-CM | POA: Diagnosis not present

## 2021-10-01 ENCOUNTER — Other Ambulatory Visit: Payer: Self-pay | Admitting: Family Medicine

## 2021-10-01 DIAGNOSIS — N1831 Chronic kidney disease, stage 3a: Secondary | ICD-10-CM

## 2021-10-01 DIAGNOSIS — N401 Enlarged prostate with lower urinary tract symptoms: Secondary | ICD-10-CM

## 2021-10-01 DIAGNOSIS — I420 Dilated cardiomyopathy: Secondary | ICD-10-CM

## 2021-10-01 NOTE — Telephone Encounter (Signed)
Medication Refill - Medication: losartan (COZAAR) 25 MG tablet Caller said they are waiting on a mail order. Needs a short supply until it arrives.   Has the patient contacted their pharmacy? Yes.     Preferred Pharmacy (with phone number or street name):   [facility] ([facility] [place] - [address]  [place], [place] ([place]) [place] [address]  Phone: [phone] Fax: [phone]   Has the patient been seen for an appointment in the last year OR does the patient have an upcoming appointment? Yes.

## 2021-10-02 MED ORDER — LOSARTAN POTASSIUM 25 MG PO TABS: 25.0000 mg | ORAL_TABLET | Freq: Every day | ORAL | 0 refills | Status: DC

## 2021-10-02 NOTE — Telephone Encounter (Signed)
Requested Prescriptions  Pending Prescriptions Disp Refills  . losartan (COZAAR) 25 MG tablet 90 tablet 0    Sig: Take 1 tablet (25 mg total) by mouth daily.     Cardiovascular:  Angiotensin Receptor Blockers Failed - 10/01/2021 11:38 AM      Failed - Cr in normal range and within 180 days    Creat  Date Value Ref Range Status  10/20/2020 1.37 (H) 0.70 - 1.22 mg/dL Final   Creatinine, Urine  Date Value Ref Range Status  10/20/2020 180 20 - 320 mg/dL Final         Failed - K in normal range and within 180 days    Potassium  Date Value Ref Range Status  10/20/2020 3.8 3.5 - 5.3 mmol/L Final         Passed - Patient is not pregnant      Passed - Last BP in normal range    BP Readings from Last 1 Encounters:  09/19/21 128/72         Passed - Valid encounter within last 6 months    Recent Outpatient Visits          1 week ago Acute lead-induced gout involving toe of left foot, initial encounter   [facility] Bo Merino, FNP   2 months ago Paroxysmal atrial fibrillation [facility])   [facility] Steele Sizer, MD   2 months ago Paroxysmal atrial fibrillation [facility])   [facility] Steele Sizer, MD   3 months ago Atherosclerosis of aorta [facility])   [facility] Steele Sizer, MD   7 months ago Stage 3a chronic kidney disease [facility])   [facility] Steele Sizer, MD      Future Appointments            In 2 weeks Ancil Boozer, Drue Stager, MD [facility], [place]   In 5 months  [place]

## 2021-10-18 NOTE — Progress Notes (Addendum)
Name: Ronald Fleming   MRN: [id number]    DOB: 1935/07/12   Date:10/19/2021       Progress Note  Subjective  Chief Complaint  Follow Up  HPI  Bradycardia: patient has home health and noticed his heart rate was low, he followed up with Ronald. Clayborn Fleming  and heart rate on arrival was 38 but on EKG - per wife - heart rate was in the 70's. Patient has been asymptomatic seen by Ronald. Clayborn Fleming, heart rate here has been in the mid 50's-60's , at home from 35-78, usually in the high 50's   OA both knees with gait instability: he is doing better,  completed  PT , he now is using a cane after PT stopped going to his house   HTN: No chest pain he has occasional palpation. He denies dizziness, he is on lower dose of Losartan 25 mg and is doing well, no chest pain or SOB  Cardiomyopathy: under the care of Ronald. Clayborn Fleming, I had decreased dose of losartan but he is back on '50mg'$  daily, and also on lasix due to cardiomyopathy with EF down to 40 %. He has SOB with activity, denies chest pain, he denies orthopnea. He saw Ronald. Clayborn Fleming in May and had repeat EKG  INTERPRETATION 51/76/1607 MILD LV SYSTOLIC DYSFUNCTION (See above)   WITH MODERATE LVH  NORMAL RIGHT VENTRICULAR SYSTOLIC FUNCTION  MODERATE VALVULAR REGURGITATION (See above)  NO VALVULAR STENOSIS  IRREGULAR HEART RHYTHM CAPTURED THROUGHOUT EXAM  ESTIMATED LVEF 40%  Aortic: MODERATE AI  Mitral: MODERATE - SEVERE MR; MAX VEL. 4.77ms  Tricuspid: MODERATE TR (3.828m)  Pulmonic: MILD PI  MODERATE LAE  MILD RAE  MILDLY DILATED ASCENDING AORTA MEASURING 3.8cm  MILDLY DILATED AORTIC ROOT MEASURING 3.8cm   CKI stage III: stable, urine micro negative, denies pruritis, on ARB advised to stay off NSAID's. Last lab was stable - GFR at 52 , we will recheck labs work   Hyperlipidemia: taking statins, denies side effects, last LDL was down to 66  He also has atherosclerosis of aorta, is on statin therapy , off aspirin, but takes pradaxa daily. Continue medication     Alzheimer's late onset: wife noticed he had memory loss, seeing [facility] Neurologist ) and is now on Razadyne '12mg'$  twice daily e has been on medication since Fall 2017, he chose to stop medication. Since wife had brain surgery 2021 , both sons are now living in town, and [name] and checks on them daily, cooking and helping around the house He is still able to perform ADL on his own, still pays his bills. Son's have access and they monitor what he is doing financially. Unchanged   Pre-diabetes: last hgbA1C was 5.7%  he denies polyphagia, polydipsia, he has BPH and takes lasix daily and that also increases urinary frequency     Afib: he states doing well, occasional palpitation and SOB with activity but denies chest pain,  he is on Pradaxa and denies easy bleeding or bruising. Sees Ronald Ronald Bignesshe has been off aspirin because of risk of bleeding. No recent falls.    BPH: he has urinary frequency during the day,  he also has nocturia two to three times daily, but able to fall back asleep, he has night lights now,  he is on Flomax and sees Ronald. StBernardo HeaterHe has a history of hematuria secondary to kidney stones but no recent episodes. Continue current regiment    Pancreas cyst: found on CT for  stone search, had repeat MRI 10/2018 and repeat was done 10/2019 . He has good appetite, no nausea, vomiting or abdominal pain. He continues to be symptoms free  and does not want any further evaluation   IMPRESSION: 2.4 cm unilocular cystic lesion in the pancreatic body, showing progressive increase in size from 1.3 cm on 2019 exam. EUS/FNA should be considered for further evaluation. This recommendation follows ACR consensus guidelines: Management of Incidental Pancreatic Cysts: A White Paper of the ACR Incidental Findings Committee. [place] [id number];[id number].   Chronic DVT right leg, also has varicose veins, no pain or discomfort at this time, he has intermittent right lower extremity  edema that resolves with compression stocking hoses . He is doing fine at this time, still on Pradaxa due to Afib/. Unchanged   Podagra: symptoms started suddenly a few weeks ago, went to Urgent care first and was given colchicine, it was very swollen on left first mcp and tender to touch, red, it is now dark , no oozing, symptoms have improved but not resolved. He has CKI stage III, son asked for referral to podiatrist , we will add allopurinol discussed possible side effects  ( specially a rash) , we will recheck uric acid today   Patient Active Problem List   Diagnosis Date Noted   Mild protein-calorie malnutrition (Ronald Fleming) 08/01/2021   Gait instability 08/01/2021   Primary osteoarthritis of both knees 08/01/2021   DDD (degenerative disc disease), lumbosacral 09/11/2017   Nephrolithiasis 09/11/2017   Vitamin D deficiency 09/11/2017   Lesion of pancreas 07/09/2017   Atherosclerosis of aorta (Ronald Fleming) 07/01/2017   Osteopenia 05/21/2017   Atrioventricular block, second degree 05/29/2016   Vitamin B12 deficiency 05/21/2016   Late onset Alzheimer's disease without behavioral disturbance (Ronald Fleming) 05/21/2016   Vitiligo 02/12/2016   BPH (benign prostatic hyperplasia) 08/03/2015   Atrial fibrillation (Ronald Fleming) 02/01/2015   Edema leg 08/23/2014   Neuropathy 08/23/2014   Benign essential HTN 08/27/2006   Hypercholesterolemia without hypertriglyceridemia 08/27/2006    Past Surgical History:  Procedure Laterality Date   COLONOSCOPY     HERNIA REPAIR  11/09/2017   INGUINAL HERNIA REPAIR Right 11/10/2017   Procedure: LAPAROSCOPIC RIGHT  INGUINAL HERNIA REPAIR;  Surgeon: Ralene Ok, MD;  Location: [facility];  Service: General;  Laterality: Right;   INGUINAL HERNIA REPAIR Left 02/23/2020   Procedure: LAPAROSCOPIC LEFT  INGUINAL HERNIA WITH MESH;  Surgeon: Ralene Ok, MD;  Location: [facility];  Service: General;  Laterality: Left;   INSERTION OF MESH Right 11/10/2017   Procedure: INSERTION OF MESH;  Surgeon:  Ralene Ok, MD;  Location: [facility];  Service: General;  Laterality: Right;    Family History  Problem Relation Age of Onset   Coronary artery disease Father    Stroke Son    Kidney disease Neg Hx    Prostate cancer Neg Hx    Bladder Cancer Neg Hx     Social History   Tobacco Use   Smoking status: Never   Smokeless tobacco: Never  Substance Use Topics   Alcohol use: Never    Alcohol/week: 0.0 standard drinks of alcohol     Current Outpatient Medications:    atorvastatin (LIPITOR) 20 MG tablet, TAKE 1 TABLET EVERY DAY, Disp: 90 tablet, Rfl: 1   Cholecalciferol (VITAMIN D) 50 MCG (2000 UT) CAPS, Take 1 capsule (2,000 Units total) by mouth daily., Disp: 100 capsule, Rfl: 3   dabigatran (PRADAXA) 150 MG CAPS capsule, Take 1 capsule (150 mg total) by  mouth 2 (two) times daily., Disp: 180 capsule, Rfl: 1   furosemide (LASIX) 20 MG tablet, Take 1 tablet by mouth in the morning., Disp: , Rfl:    losartan (COZAAR) 25 MG tablet, Take 1 tablet (25 mg total) by mouth daily., Disp: 90 tablet, Rfl: 0   tamsulosin (FLOMAX) 0.4 MG CAPS capsule, TAKE 1 CAPSULE EVERY DAY, Disp: 90 capsule, Rfl: 1   vitamin B-12 (CYANOCOBALAMIN) 1000 MCG tablet, Take 2,000 mcg by mouth 3 (three) times a week. , Disp: , Rfl:   No Known Allergies  I personally reviewed active problem list, medication list, allergies, family history, social history, health maintenance with the patient/caregiver today.   ROS  Constitutional: Negative for fever or weight change.  Respiratory: Negative for cough and shortness of breath.   Cardiovascular: Negative for chest pain or palpitations.  Gastrointestinal: Negative for abdominal pain, no bowel changes.  Musculoskeletal: Negative for gait problem or joint swelling.  Skin: Negative for rash.  Neurological: Negative for dizziness or headache.  No other specific complaints in a complete review of systems (except as listed in HPI above).   Objective  Vitals:   10/19/21  1030  BP: 130/84  Pulse: (!) 57  Resp: 16  SpO2: 98%  Weight: 179 lb (81.2 kg)  Height: '5\' 11"'$  (1.803 m)    Body mass index is 24.97 kg/m.  Physical Exam  Constitutional: Patient appears well-developed and well-nourished.  No distress.  HEENT: head atraumatic, normocephalic, pupils equal and reactive to light, neck supple Cardiovascular: Normal rate, irregular rhythm and normal heart sounds.  No murmur heard. No BLE edema. Pulmonary/Chest: Effort normal and breath sounds normal. No respiratory distress. Abdominal: Soft.  There is no tenderness. Muscular skeletal: : swollen first MCP left foot, some hyperpigmentation and desquamation, very mild pain with rom Psychiatric: Patient has a normal mood and affect. behavior is normal. Judgment and thought content normal.   Recent Results (from the past 2160 hour(s))  Uric acid     Status: None   Collection Time: 09/19/21  3:16 PM  Result Value Ref Range   Uric Acid, Serum 6.8 4.0 - 8.0 mg/dL    Comment: Therapeutic target for gout patients: <6.0 mg/dL .     PHQ2/9:    10/19/2021   10:29 AM 09/19/2021    2:57 PM 08/01/2021   10:30 AM 07/20/2021    2:38 PM 06/20/2021   10:24 AM  Depression screen PHQ 2/9  Decreased Interest 0 0 0 0 0  Down, Depressed, Hopeless 0 0 0 0 0  PHQ - 2 Score 0 0 0 0 0  Altered sleeping 0 0 0 0 0  Tired, decreased energy 0 0 0 0 0  Change in appetite 0 0 0 0 2  Feeling bad or failure about yourself  0 0 0 0 0  Trouble concentrating 0 0 0 0 0  Moving slowly or fidgety/restless 0 0 0 0 0  Suicidal thoughts 0 0 0 0 0  PHQ-9 Score 0 0 0 0 2  Difficult doing work/chores  Not difficult at all       phq 9 is negative   Fall Risk:    10/19/2021   10:29 AM 09/19/2021    2:56 PM 08/01/2021   10:30 AM 07/20/2021    2:37 PM 06/20/2021   10:24 AM  Fall Risk   Falls in the past year? '1 1 1 1 1  '$ Number falls in past yr: '1 1 1 1 '$ 1  Injury with Fall? 0 1 0 0 1  Risk for fall due to : Impaired balance/gait Impaired  balance/gait;Impaired mobility History of fall(s);Impaired balance/gait Impaired mobility Impaired balance/gait  Follow up Falls prevention discussed  Falls prevention discussed;Education provided;Falls evaluation completed Falls prevention discussed Falls prevention discussed      Functional Status Survey: Is the patient deaf or have difficulty hearing?: No Does the patient have difficulty seeing, even when wearing glasses/contacts?: No Does the patient have difficulty concentrating, remembering, or making decisions?: No Does the patient have difficulty walking or climbing stairs?: Yes Does the patient have difficulty dressing or bathing?: No Does the patient have difficulty doing errands alone such as visiting a doctor's office or shopping?: No    Assessment & Plan  1. Stage 3a chronic kidney disease (HCC)  - Microalbumin / creatinine urine ratio - CBC with Differential/Platelet - COMPLETE METABOLIC PANEL WITH GFR - losartan (COZAAR) 25 MG tablet; Take 1 tablet (25 mg total) by mouth daily.  Dispense: 90 tablet; Refill: 1  2. Paroxysmal atrial fibrillation (HCC)  - dabigatran (PRADAXA) 150 MG CAPS capsule; Take 1 capsule (150 mg total) by mouth 2 (two) times daily.  Dispense: 180 capsule; Refill: 1  3. Atherosclerosis of aorta (HCC)  - Lipid panel  4. Late onset Alzheimer's disease without behavioral disturbance ([place])   5. Chronic deep vein thrombosis (DVT) of right femoral vein (HCC)   6. Thrombocytopenia ([facility])  Recheck CBC  7. Vitamin D deficiency  - VITAMIN D 25 Hydroxy (Vit-D Deficiency, Fractures)  8. Podagra  - Ambulatory referral to Podiatry - Uric acid  9. Dilated cardiomyopathy (HCC)  - losartan (COZAAR) 25 MG tablet; Take 1 tablet (25 mg total) by mouth daily.  Dispense: 90 tablet; Refill: 1  10. Primary osteoarthritis of both knees  Using cane  11. Bradycardia   12. Vitamin B12 deficiency   13. Benign essential HTN   14. Benign  prostatic hyperplasia (BPH) with urinary urgency

## 2021-10-19 ENCOUNTER — Ambulatory Visit: Payer: Medicare PPO | Admitting: Family Medicine

## 2021-10-19 ENCOUNTER — Encounter: Payer: Self-pay | Admitting: Family Medicine

## 2021-10-19 VITALS — BP 130/84 | HR 57 | Resp 16 | Ht 71.0 in | Wt 179.0 lb

## 2021-10-19 DIAGNOSIS — I700 Atherosclerosis of aorta: Secondary | ICD-10-CM

## 2021-10-19 DIAGNOSIS — G301 Alzheimer's disease with late onset: Secondary | ICD-10-CM

## 2021-10-19 DIAGNOSIS — R3915 Urgency of urination: Secondary | ICD-10-CM

## 2021-10-19 DIAGNOSIS — I82511 Chronic embolism and thrombosis of right femoral vein: Secondary | ICD-10-CM | POA: Diagnosis not present

## 2021-10-19 DIAGNOSIS — I10 Essential (primary) hypertension: Secondary | ICD-10-CM

## 2021-10-19 DIAGNOSIS — D696 Thrombocytopenia, unspecified: Secondary | ICD-10-CM

## 2021-10-19 DIAGNOSIS — N401 Enlarged prostate with lower urinary tract symptoms: Secondary | ICD-10-CM

## 2021-10-19 DIAGNOSIS — I480 Paroxysmal atrial fibrillation: Secondary | ICD-10-CM | POA: Diagnosis not present

## 2021-10-19 DIAGNOSIS — R001 Bradycardia, unspecified: Secondary | ICD-10-CM

## 2021-10-19 DIAGNOSIS — M109 Gout, unspecified: Secondary | ICD-10-CM | POA: Diagnosis not present

## 2021-10-19 DIAGNOSIS — I420 Dilated cardiomyopathy: Secondary | ICD-10-CM

## 2021-10-19 DIAGNOSIS — F0280 Dementia in other diseases classified elsewhere without behavioral disturbance: Secondary | ICD-10-CM

## 2021-10-19 DIAGNOSIS — M170 Bilateral primary osteoarthritis of knee: Secondary | ICD-10-CM

## 2021-10-19 DIAGNOSIS — N1831 Chronic kidney disease, stage 3a: Secondary | ICD-10-CM

## 2021-10-19 DIAGNOSIS — E559 Vitamin D deficiency, unspecified: Secondary | ICD-10-CM

## 2021-10-19 DIAGNOSIS — E538 Deficiency of other specified B group vitamins: Secondary | ICD-10-CM

## 2021-10-19 MED ORDER — DABIGATRAN ETEXILATE MESYLATE 150 MG PO CAPS: 150.0000 mg | ORAL_CAPSULE | Freq: Two times a day (BID) | ORAL | 1 refills | Status: DC

## 2021-10-19 MED ORDER — ALLOPURINOL 100 MG PO TABS: 100.0000 mg | ORAL_TABLET | Freq: Every day | ORAL | 0 refills | Status: DC

## 2021-10-19 MED ORDER — LOSARTAN POTASSIUM 25 MG PO TABS: 25.0000 mg | ORAL_TABLET | Freq: Every day | ORAL | 1 refills | Status: DC

## 2021-10-20 LAB — CBC WITH DIFFERENTIAL/PLATELET
Absolute Monocytes: 463 cells/uL (ref 200–950)
Basophils Absolute: 31 cells/uL (ref 0–200)
Basophils Relative: 0.6 %
Eosinophils Absolute: 99 cells/uL (ref 15–500)
Eosinophils Relative: 1.9 %
HCT: 41.1 % (ref 38.5–50.0)
Hemoglobin: 13.8 g/dL (ref 13.2–17.1)
Lymphs Abs: 946 cells/uL (ref 850–3900)
MCH: 32.9 pg (ref 27.0–33.0)
MCHC: 33.6 g/dL (ref 32.0–36.0)
MCV: 97.9 fL (ref 80.0–100.0)
MPV: 11.8 fL (ref 7.5–12.5)
Monocytes Relative: 8.9 %
Neutro Abs: 3661 cells/uL (ref 1500–7800)
Neutrophils Relative %: 70.4 %
Platelets: 157 10*3/uL (ref 140–400)
RBC: 4.2 10*6/uL (ref 4.20–5.80)
RDW: 11.9 % (ref 11.0–15.0)
Total Lymphocyte: 18.2 %
WBC: 5.2 10*3/uL (ref 3.8–10.8)

## 2021-10-20 LAB — MICROALBUMIN / CREATININE URINE RATIO
Creatinine, Urine: 152 mg/dL (ref 20–320)
Microalb, Ur: <0.2 mg/dL

## 2021-10-20 LAB — LIPID PANEL
Cholesterol: 160 mg/dL (ref <200)
HDL: 60 mg/dL (ref >=40)
LDL Cholesterol (Calc): 84 mg/dL (calc)
Non-HDL Cholesterol (Calc): 100 mg/dL (calc) (ref <130)
Total CHOL/HDL Ratio: 2.7 (calc) (ref <5.0)
Triglycerides: 73 mg/dL (ref <150)

## 2021-10-20 LAB — COMPLETE METABOLIC PANEL WITH GFR
AG Ratio: 1.3 (calc) (ref 1.0–2.5)
ALT: 8 U/L — ABNORMAL LOW (ref 9–46)
AST: 14 U/L (ref 10–35)
Albumin: 3.9 g/dL (ref 3.6–5.1)
Alkaline phosphatase (APISO): 84 U/L (ref 35–144)
BUN/Creatinine Ratio: 15 (calc) (ref 6–22)
BUN: 20 mg/dL (ref 7–25)
CO2: 23 mmol/L (ref 20–32)
Calcium: 9.1 mg/dL (ref 8.6–10.3)
Chloride: 107 mmol/L (ref 98–110)
Creat: 1.34 mg/dL — ABNORMAL HIGH (ref 0.70–1.22)
Globulin: 3.1 g/dL (calc) (ref 1.9–3.7)
Glucose, Bld: 83 mg/dL (ref 65–99)
Potassium: 3.9 mmol/L (ref 3.5–5.3)
Sodium: 140 mmol/L (ref 135–146)
Total Bilirubin: 0.7 mg/dL (ref 0.2–1.2)
Total Protein: 7 g/dL (ref 6.1–8.1)
eGFR: 53 mL/min/{1.73_m2} — ABNORMAL LOW (ref >=60)

## 2021-10-20 LAB — VITAMIN D 25 HYDROXY (VIT D DEFICIENCY, FRACTURES): Vit D, 25-Hydroxy: 23 ng/mL — ABNORMAL LOW (ref 30–100)

## 2021-10-20 LAB — URIC ACID: Uric Acid, Serum: 6.9 mg/dL (ref 4.0–8.0)

## 2021-10-29 ENCOUNTER — Ambulatory Visit (INDEPENDENT_AMBULATORY_CARE_PROVIDER_SITE_OTHER): Payer: Self-pay | Admitting: Podiatry

## 2021-10-29 ENCOUNTER — Ambulatory Visit (INDEPENDENT_AMBULATORY_CARE_PROVIDER_SITE_OTHER): Payer: Self-pay

## 2021-10-29 ENCOUNTER — Encounter: Payer: Self-pay | Admitting: Podiatry

## 2021-10-29 DIAGNOSIS — M1A0720 Idiopathic chronic gout, left ankle and foot, without tophus (tophi): Secondary | ICD-10-CM

## 2021-10-29 DIAGNOSIS — M778 Other enthesopathies, not elsewhere classified: Secondary | ICD-10-CM

## 2021-10-29 MED ORDER — TRIAMCINOLONE ACETONIDE 40 MG/ML IJ SUSP
20.0000 mg | Freq: Once | INTRAMUSCULAR | Status: AC
  Administered 2021-10-29: 20 mg

## 2021-10-29 NOTE — Progress Notes (Signed)
Subjective:  Patient ID: Ronald Fleming, male    DOB: [birth year],  MRN: [id number] HPI Chief Complaint  Patient presents with   Foot Pain    1st MPJ and forefoot left - episodes of pain, swelling, redness x months, "today its not too bad", takes allopurinol daily   New Patient (Initial Visit)    86 y.o. male presents with the above complaint.   ROS: Denies fever chills nausea vomiting muscle aches pains calf pain back pain chest pain shortness of breath.  Past Medical History:  Diagnosis Date   Arrhythmia    Arthritis    Benign positional vertigo    BPH (benign prostatic hyperplasia)    Chronic kidney disease, stage III (moderate) (HCC)    Deep vein blood clot of right lower extremity (HCC)    Dementia ([place])    DVT (deep venous thrombosis) ([place])    Dysrhythmia    ApFib   Glaucoma    Hyperlipidemia    Hypertension    Nephrolithiasis    Neuropathy    Past Surgical History:  Procedure Laterality Date   COLONOSCOPY     HERNIA REPAIR  11/09/2017   INGUINAL HERNIA REPAIR Right 11/10/2017   Procedure: LAPAROSCOPIC RIGHT  INGUINAL HERNIA REPAIR;  Surgeon: Ralene Ok, MD;  Location: [facility];  Service: General;  Laterality: Right;   INGUINAL HERNIA REPAIR Left 02/23/2020   Procedure: LAPAROSCOPIC LEFT  INGUINAL HERNIA WITH MESH;  Surgeon: Ralene Ok, MD;  Location: [facility];  Service: General;  Laterality: Left;   INSERTION OF MESH Right 11/10/2017   Procedure: INSERTION OF MESH;  Surgeon: Ralene Ok, MD;  Location: [facility];  Service: General;  Laterality: Right;    Current Outpatient Medications:    allopurinol (ZYLOPRIM) 100 MG tablet, Take 1 tablet (100 mg total) by mouth daily., Disp: 90 tablet, Rfl: 0   atorvastatin (LIPITOR) 20 MG tablet, TAKE 1 TABLET EVERY DAY, Disp: 90 tablet, Rfl: 1   Cholecalciferol (VITAMIN D) 50 MCG (2000 UT) CAPS, Take 1 capsule (2,000 Units total) by mouth daily., Disp: 100 capsule, Rfl: 3   dabigatran (PRADAXA) 150 MG CAPS capsule, Take 1  capsule (150 mg total) by mouth 2 (two) times daily., Disp: 180 capsule, Rfl: 1   furosemide (LASIX) 20 MG tablet, Take 1 tablet by mouth in the morning., Disp: , Rfl:    losartan (COZAAR) 25 MG tablet, Take 1 tablet (25 mg total) by mouth daily., Disp: 90 tablet, Rfl: 1   tamsulosin (FLOMAX) 0.4 MG CAPS capsule, TAKE 1 CAPSULE EVERY DAY, Disp: 90 capsule, Rfl: 1   vitamin B-12 (CYANOCOBALAMIN) 1000 MCG tablet, Take 2,000 mcg by mouth 3 (three) times a week. , Disp: , Rfl:   No Known Allergies Review of Systems Objective:  There were no vitals filed for this visit.  General: Well developed, nourished, in no acute distress, alert and oriented x3   Dermatological: Skin is warm, dry and supple bilateral. Nails x 10 are well maintained; remaining integument appears unremarkable at this time. There are no open sores, no preulcerative lesions, no rash or signs of infection present.  Vascular: Dorsalis Pedis artery and Posterior Tibial artery pedal pulses are 2/4 bilateral with immedate capillary fill time. Pedal hair growth present. No varicosities and no lower extremity edema present bilateral.   Neruologic: Grossly intact via light touch bilateral. Vibratory intact via tuning fork bilateral. Protective threshold with Semmes Wienstein monofilament intact to all pedal sites bilateral. Patellar and Achilles deep tendon reflexes 2+ bilateral.  No Babinski or clonus noted bilateral.   Musculoskeletal: No gross boney pedal deformities bilateral. No pain, crepitus, or limitation noted with foot and ankle range of motion bilateral. Muscular strength 5/5 in all groups tested bilateral.  Postinflammatory hyperpigmentation along the dorsal and dorsal medial aspect of the metatarsal heads and the first metatarsal.  He has no pain on palpation of the dorsal aspect of the lesser metatarsals however he does have pain on palpation medial aspect of the first metatarsal phalangeal joint.  He has some tenderness on range  of motion of the first metatarsophalangeal joint but minimally so.  Gait: Unassisted, Nonantalgic.    Radiographs:  Radiographs taken today demonstrate an increase in the first intermetatarsal angle greater than normal value mild osteoarthritic changes at the level of the first metatarsophalangeal joint however there is significant soft tissue swelling to the medial aspect of the hypertrophic medial condyle of the head of the first metatarsal.  Lateral view does demonstrate some swelling across the dorsum of the foot.  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  Plan: Discussed etiology pathology conservative therapies at this point I recommend he continue to take the allopurinol.  Should this happen again he is to notify us immediately and we will do another set of blood work.  Current blood work uric acid was 6.9.  I will go ahead and inject around his joint today first metatarsophalangeal joint with Kenalog and local anesthetic.  He is to continue the allopurinol.     [name], [place]

## 2021-11-07 DIAGNOSIS — N1831 Chronic kidney disease, stage 3a: Secondary | ICD-10-CM | POA: Diagnosis not present

## 2021-11-07 DIAGNOSIS — R0602 Shortness of breath: Secondary | ICD-10-CM | POA: Diagnosis not present

## 2021-11-07 DIAGNOSIS — I208 Other forms of angina pectoris: Secondary | ICD-10-CM | POA: Diagnosis not present

## 2021-11-07 DIAGNOSIS — I82501 Chronic embolism and thrombosis of unspecified deep veins of right lower extremity: Secondary | ICD-10-CM | POA: Diagnosis not present

## 2021-11-07 DIAGNOSIS — I480 Paroxysmal atrial fibrillation: Secondary | ICD-10-CM | POA: Diagnosis not present

## 2021-11-07 DIAGNOSIS — G4733 Obstructive sleep apnea (adult) (pediatric): Secondary | ICD-10-CM | POA: Diagnosis not present

## 2021-11-07 DIAGNOSIS — I420 Dilated cardiomyopathy: Secondary | ICD-10-CM | POA: Diagnosis not present

## 2021-11-07 DIAGNOSIS — R001 Bradycardia, unspecified: Secondary | ICD-10-CM | POA: Diagnosis not present

## 2021-12-12 ENCOUNTER — Ambulatory Visit (INDEPENDENT_AMBULATORY_CARE_PROVIDER_SITE_OTHER): Payer: Medicare PPO

## 2021-12-12 DIAGNOSIS — Z23 Encounter for immunization: Secondary | ICD-10-CM

## 2021-12-12 NOTE — Progress Notes (Signed)
Patient tolerated vaccine well with no immediate adverse reaction noted.

## 2021-12-31 ENCOUNTER — Telehealth: Payer: Self-pay | Admitting: Family Medicine

## 2021-12-31 DIAGNOSIS — I480 Paroxysmal atrial fibrillation: Secondary | ICD-10-CM

## 2022-01-03 ENCOUNTER — Other Ambulatory Visit: Payer: Self-pay | Admitting: Family Medicine

## 2022-01-03 NOTE — Telephone Encounter (Signed)
Spoke with wife and she will check with pharmacy

## 2022-01-03 NOTE — Telephone Encounter (Signed)
Wife called saying he is completely out of this medictation  CB@  [phone]

## 2022-01-04 ENCOUNTER — Other Ambulatory Visit: Payer: Self-pay | Admitting: Family Medicine

## 2022-01-04 MED ORDER — DABIGATRAN ETEXILATE MESYLATE 150 MG PO CAPS
150.0000 mg | ORAL_CAPSULE | Freq: Two times a day (BID) | ORAL | 0 refills | Status: DC
Start: 2022-01-04 — End: 2022-02-18

## 2022-01-04 NOTE — Telephone Encounter (Signed)
Spoke to wife and let her know RX was sent to [facility].

## 2022-01-04 NOTE — Telephone Encounter (Signed)
Patient states he took his last pill on 01/02/22 and is requesting a few pills sent to [facility] to last him until his prescription comes from [facility].

## 2022-01-17 ENCOUNTER — Other Ambulatory Visit: Payer: Self-pay | Admitting: Family Medicine

## 2022-02-06 ENCOUNTER — Ambulatory Visit (LOCAL_COMMUNITY_HEALTH_CENTER): Payer: Medicare PPO

## 2022-02-06 DIAGNOSIS — Z23 Encounter for immunization: Secondary | ICD-10-CM

## 2022-02-06 DIAGNOSIS — Z719 Counseling, unspecified: Secondary | ICD-10-CM

## 2022-02-06 NOTE — Progress Notes (Signed)
Vis provided  Vaccine screening  Are you feeling sick today? No   Have you ever received a dose of COVID-19 Vaccine? [employer], [place], [place], [place], Other) Yes  If yes, which vaccine and how many doses?   [place] 5   Did you bring the vaccination record card or other documentation?  Yes   Do you have a health condition or are undergoing treatment that makes you moderately or severely immunocompromised? This would include, but not be limited to: cancer, HIV, organ transplant, immunosuppressive therapy/high-dose corticosteroids, or moderate/severe primary immunodeficiency.  No  Have you received COVID-19 vaccine before or during hematopoietic cell transplant (HCT) or CAR-T-cell therapies? No  Have you ever had an allergic reaction to: (This would include a severe allergic reaction or a reaction that caused hives, swelling, or respiratory distress, including wheezing.) A component of a COVID-19 vaccine or a previous dose of COVID-19 vaccine? No   Have you ever had an allergic reaction to another vaccine (other thanCOVID-19 vaccine) or an injectable medication? (This would include a severe allergic reaction or a reaction that caused hives, swelling, or respiratory distress, including wheezing.)   No    Do you have a history of any of the following:  Myocarditis or Pericarditis No  Dermal fillers:  No  Multisystem Inflammatory Syndrome (MIS-C or MIS-A)? No  COVID-19 disease within the past 3 months? No  Vaccinated with monkeypox vaccine in the last 4 weeks? No   Vaccine administered  After vaccine care reviewed.  Copy of [facility] provided

## 2022-02-15 NOTE — Progress Notes (Signed)
Name: Ronald Fleming   MRN: [id number]    DOB: 29-Oct-1935   Date:02/18/2022       Progress Note  Subjective  Chief Complaint  Follow Up  HPI  Bradycardia: patient has home health and noticed his heart rate was low, he followed up with Dr. Juliann Pares  and heart rate on arrival was 38 but on EKG - per wife - heart rate was in the 70's. Patient has been asymptomatic seen by Dr. Juliann Pares, heart rate today was 72   OA both knees with gait instability: he is doing better,  completed  PT , he now is using a cane to help with balance. He takes Tylenol prn   HTN: No chest pain he has occasional palpation. He denies dizziness, he is on lower dose of Losartan 25 mg and is doing well, no chest pain and only has SOB with moderate activity   Cardiomyopathy: under the care of Dr. Frederich Balding lower dose of losartan - currently at 25 mg daily - also on lasix due to cardiomyopathy with EF down to 40 %. He has SOB with activity, denies chest pain, he denies orthopnea. He saw Dr. Juliann Pares in May and had repeat EKG  INTERPRETATION 04/16/2021 MILD LV SYSTOLIC DYSFUNCTION (See above)   WITH MODERATE LVH  NORMAL RIGHT VENTRICULAR SYSTOLIC FUNCTION  MODERATE VALVULAR REGURGITATION (See above)  NO VALVULAR STENOSIS  IRREGULAR HEART RHYTHM CAPTURED THROUGHOUT EXAM  ESTIMATED LVEF 40%  Aortic: MODERATE AI  Mitral: MODERATE - SEVERE MR; MAX VEL. 4.38m/s  Tricuspid: MODERATE TR (3.62m/s)  Pulmonic: MILD PI  MODERATE LAE  MILD RAE  MILDLY DILATED ASCENDING AORTA MEASURING 3.8cm  MILDLY DILATED AORTIC ROOT MEASURING 3.8cm   CKI stage III: stable, urine micro negative, denies pruritis, on ARB advised to stay off NSAID's. Last lab was stable - GFR at 53  urine micro negative   Hyperlipidemia: taking statins, denies side effects, last LDL went up from 66 to 84, he states compliant with medication. He also has atherosclerosis of aorta, is on statin therapy , off aspirin, but takes pradaxa daily. We will recheck it next  time, since it was at goal previously   Alzheimer's late onset: wife noticed he had memory loss, seeing Dr Sherryll Burger ( Neurologist ) and is now on Razadyne 12mg  twice daily e has been on medication since Fall 2017, he chose to stop medication. Since wife had brain surgery 2021 , both sons are now living in town, and Peterman lives next door and checks on them daily, cooking and helping around the house He is still able to perform ADL on his own, still pays his bills. And drives   Pre-diabetes:  he denies polyphagia, polydipsia, he has BPH and takes lasix daily and that also increases urinary frequency     Afib: he states doing well, occasional palpitation and SOB with activity but denies chest pain,  he is on Pradaxa and denies easy bleeding or bruising. Sees Dr Juliann Pares, he has been off aspirin because of risk of bleeding but is on pradaxa    BPH: he has urinary frequency during the day,  he also has nocturia two to three times daily, but able to fall back asleep, he has night lights now,  he is on Flomax and sees Dr. Lonna Cobb. He has a history of hematuria secondary to kidney stones but no recent episodes. Continue current regiment    Pancreas cyst: found on CT for stone search, had repeat MRI 10/2018 and repeat was done  10/2019 . He has good appetite, no nausea, vomiting or abdominal pain. He continues to be symptoms free  and does not want any further evaluation Unchanged   IMPRESSION: 2.4 cm unilocular cystic lesion in the pancreatic body, showing progressive increase in size from 1.3 cm on 2019 exam. EUS/FNA should be considered for further evaluation. This recommendation follows ACR consensus guidelines: Management of Incidental Pancreatic Cysts: A White Paper of the ACR Incidental Findings Committee. J Am Coll Radiol 2017;14:911-923.   Chronic DVT right leg, also has varicose veins, no pain or discomfort at this time, he has intermittent right lower extremity edema that resolves with compression  stocking hoses . He is doing fine at this time, still on Pradaxa due to Afib/. Unchanged   Gout: no recent episodes, but last uric acid was not at goal , we will adjust dose to BID  Patient Active Problem List   Diagnosis Date Noted   Mild protein-calorie malnutrition (HCC) 08/01/2021   Gait instability 08/01/2021   Primary osteoarthritis of both knees 08/01/2021   DDD (degenerative disc disease), lumbosacral 09/11/2017   Nephrolithiasis 09/11/2017   Vitamin D deficiency 09/11/2017   Lesion of pancreas 07/09/2017   Atherosclerosis of aorta (HCC) 07/01/2017   Osteopenia 05/21/2017   Atrioventricular block, second degree 05/29/2016   Vitamin B12 deficiency 05/21/2016   Late onset Alzheimer's disease without behavioral disturbance (HCC) 05/21/2016   Vitiligo 02/12/2016   BPH (benign prostatic hyperplasia) 08/03/2015   Atrial fibrillation (HCC) 02/01/2015   Edema leg 08/23/2014   Neuropathy 08/23/2014   Benign essential HTN 08/27/2006   Hypercholesterolemia without hypertriglyceridemia 08/27/2006    Past Surgical History:  Procedure Laterality Date   COLONOSCOPY     HERNIA REPAIR  11/09/2017   INGUINAL HERNIA REPAIR Right 11/10/2017   Procedure: LAPAROSCOPIC RIGHT  INGUINAL HERNIA REPAIR;  Surgeon: Axel Filler, MD;  Location: [facility] OR;  Service: General;  Laterality: Right;   INGUINAL HERNIA REPAIR Left 02/23/2020   Procedure: LAPAROSCOPIC LEFT  INGUINAL HERNIA WITH MESH;  Surgeon: Axel Filler, MD;  Location: [facility] OR;  Service: General;  Laterality: Left;   INSERTION OF MESH Right 11/10/2017   Procedure: INSERTION OF MESH;  Surgeon: Axel Filler, MD;  Location: [facility] OR;  Service: General;  Laterality: Right;    Family History  Problem Relation Age of Onset   Coronary artery disease Father    Stroke Son    Kidney disease Neg Hx    Prostate cancer Neg Hx    Bladder Cancer Neg Hx     Social History   Tobacco Use   Smoking status: Never   Smokeless tobacco: Never   Substance Use Topics   Alcohol use: Never    Alcohol/week: 0.0 standard drinks of alcohol     Current Outpatient Medications:    allopurinol (ZYLOPRIM) 100 MG tablet, TAKE 1 TABLET (100 MG TOTAL) BY MOUTH DAILY., Disp: 90 tablet, Rfl: 1   atorvastatin (LIPITOR) 20 MG tablet, TAKE 1 TABLET EVERY DAY, Disp: 90 tablet, Rfl: 1   Cholecalciferol (VITAMIN D) 50 MCG (2000 UT) CAPS, Take 1 capsule (2,000 Units total) by mouth daily., Disp: 100 capsule, Rfl: 3   dabigatran (PRADAXA) 150 MG CAPS capsule, Take 1 capsule (150 mg total) by mouth 2 (two) times daily., Disp: 60 capsule, Rfl: 0   furosemide (LASIX) 20 MG tablet, Take 1 tablet by mouth in the morning., Disp: , Rfl:    losartan (COZAAR) 25 MG tablet, Take 1 tablet (25 mg total) by mouth daily.,  Disp: 90 tablet, Rfl: 1   tamsulosin (FLOMAX) 0.4 MG CAPS capsule, TAKE 1 CAPSULE EVERY DAY, Disp: 90 capsule, Rfl: 1   vitamin B-12 (CYANOCOBALAMIN) 1000 MCG tablet, Take 2,000 mcg by mouth 3 (three) times a week. , Disp: , Rfl:   No Known Allergies  I personally reviewed active problem list, medication list, allergies, family history, social history, health maintenance with the patient/caregiver today.   ROS  Constitutional: Negative for fever , positive for  weight change.  Respiratory: Negative for cough, only has shortness of breath with moderate activity .   Cardiovascular: Negative for chest pain or palpitations.  Gastrointestinal: Negative for abdominal pain, no bowel changes.  Musculoskeletal: positive  for gait problem but no  joint swelling.  Skin: Negative for rash.  Neurological: Negative for dizziness or headache.  No other specific complaints in a complete review of systems (except as listed in HPI above).   Objective  Vitals:   02/18/22 0947  BP: 134/78  Pulse: 72  Resp: 16  SpO2: 96%  Weight: 189 lb (85.7 kg)  Height: 5\' 11"  (1.803 m)    Body mass index is 26.36 kg/m.  Physical Exam  Constitutional: Patient  appears well-developed and well-nourished.  No distress.  HEENT: head atraumatic, normocephalic, pupils equal and reactive to light, neck supple Cardiovascular: Normal rate, regular rhythm and normal heart sounds.  No murmur heard. No BLE edema. Pulmonary/Chest: Effort normal and breath sounds normal. No respiratory distress. Abdominal: Soft.  There is no tenderness. Muscular skeletal: he has crepitus with extension of knee , using a cane for balance Psychiatric: Patient has a normal mood and affect. behavior is normal. Judgment and thought content normal.    PHQ2/9:    02/18/2022    9:46 AM 10/19/2021   10:29 AM 09/19/2021    2:57 PM 08/01/2021   10:30 AM 07/20/2021    2:38 PM  Depression screen PHQ 2/9  Decreased Interest 0 0 0 0 0  Down, Depressed, Hopeless 0 0 0 0 0  PHQ - 2 Score 0 0 0 0 0  Altered sleeping 0 0 0 0 0  Tired, decreased energy 0 0 0 0 0  Change in appetite 0 0 0 0 0  Feeling bad or failure about yourself  0 0 0 0 0  Trouble concentrating 0 0 0 0 0  Moving slowly or fidgety/restless 0 0 0 0 0  Suicidal thoughts 0 0 0 0 0  PHQ-9 Score 0 0 0 0 0  Difficult doing work/chores   Not difficult at all      phq 9 is negative   Fall Risk:    02/18/2022    9:46 AM 10/19/2021   10:29 AM 09/19/2021    2:56 PM 08/01/2021   10:30 AM 07/20/2021    2:37 PM  Fall Risk   Falls in the past year? 0 1 1 1 1   Number falls in past yr: 0 1 1 1 1   Injury with Fall? 0 0 1 0 0  Risk for fall due to : Impaired balance/gait Impaired balance/gait Impaired balance/gait;Impaired mobility History of fall(s);Impaired balance/gait Impaired mobility  Follow up Falls prevention discussed Falls prevention discussed  Falls prevention discussed;Education provided;Falls evaluation completed Falls prevention discussed      Functional Status Survey: Is the patient deaf or have difficulty hearing?: Yes Does the patient have difficulty seeing, even when wearing glasses/contacts?: No Does the patient  have difficulty concentrating, remembering, or making decisions?: No Does the  patient have difficulty walking or climbing stairs?: Yes Does the patient have difficulty dressing or bathing?: No Does the patient have difficulty doing errands alone such as visiting a doctor's office or shopping?: No    Assessment & Plan  1. Paroxysmal atrial fibrillation (HCC)  - dabigatran (PRADAXA) 150 MG CAPS capsule; Take 1 capsule (150 mg total) by mouth 2 (two) times daily.  Dispense: 180 capsule; Refill: 1  2. Stage 3a chronic kidney disease (HCC)  Continue monitoring it  3. Chronic deep vein thrombosis (DVT) of right femoral vein (HCC)  Stable   4. Late onset Alzheimer's disease without behavioral disturbance (HCC)  Stable   5. Atherosclerosis of aorta (HCC)  On statin therapy   6. Dilated cardiomyopathy (HCC)  stable  7. Vitamin D deficiency  Continue supplementation   8. Primary osteoarthritis of both knees   9. Vitamin B12 deficiency  Continue supplementation   10. Gait instability  Using a cane  11. Hypercholesterolemia without hypertriglyceridemia  - atorvastatin (LIPITOR) 20 MG tablet; Take 1 tablet (20 mg total) by mouth daily.  Dispense: 90 tablet; Refill: 1  12. Controlled gout  We will adjust dose of Allopurinol today  - allopurinol (ZYLOPRIM) 100 MG tablet; Take 1 tablet (100 mg total) by mouth 2 (two) times daily.  Dispense: 180 tablet; Refill: 1  13. Benign prostatic hyperplasia (BPH) with urinary urgency

## 2022-02-18 ENCOUNTER — Encounter: Payer: Self-pay | Admitting: Family Medicine

## 2022-02-18 ENCOUNTER — Ambulatory Visit (INDEPENDENT_AMBULATORY_CARE_PROVIDER_SITE_OTHER): Payer: Medicare PPO | Admitting: Family Medicine

## 2022-02-18 VITALS — BP 134/78 | HR 72 | Resp 16 | Ht 71.0 in | Wt 189.0 lb

## 2022-02-18 DIAGNOSIS — M170 Bilateral primary osteoarthritis of knee: Secondary | ICD-10-CM | POA: Diagnosis not present

## 2022-02-18 DIAGNOSIS — N1831 Chronic kidney disease, stage 3a: Secondary | ICD-10-CM | POA: Diagnosis not present

## 2022-02-18 DIAGNOSIS — E559 Vitamin D deficiency, unspecified: Secondary | ICD-10-CM | POA: Diagnosis not present

## 2022-02-18 DIAGNOSIS — E7800 Pure hypercholesterolemia, unspecified: Secondary | ICD-10-CM

## 2022-02-18 DIAGNOSIS — E538 Deficiency of other specified B group vitamins: Secondary | ICD-10-CM

## 2022-02-18 DIAGNOSIS — R3915 Urgency of urination: Secondary | ICD-10-CM

## 2022-02-18 DIAGNOSIS — F0280 Dementia in other diseases classified elsewhere without behavioral disturbance: Secondary | ICD-10-CM

## 2022-02-18 DIAGNOSIS — N401 Enlarged prostate with lower urinary tract symptoms: Secondary | ICD-10-CM

## 2022-02-18 DIAGNOSIS — G301 Alzheimer's disease with late onset: Secondary | ICD-10-CM | POA: Diagnosis not present

## 2022-02-18 DIAGNOSIS — M109 Gout, unspecified: Secondary | ICD-10-CM

## 2022-02-18 DIAGNOSIS — I420 Dilated cardiomyopathy: Secondary | ICD-10-CM | POA: Diagnosis not present

## 2022-02-18 DIAGNOSIS — R2681 Unsteadiness on feet: Secondary | ICD-10-CM

## 2022-02-18 DIAGNOSIS — I82511 Chronic embolism and thrombosis of right femoral vein: Secondary | ICD-10-CM

## 2022-02-18 DIAGNOSIS — I700 Atherosclerosis of aorta: Secondary | ICD-10-CM | POA: Diagnosis not present

## 2022-02-18 DIAGNOSIS — I480 Paroxysmal atrial fibrillation: Secondary | ICD-10-CM

## 2022-02-18 MED ORDER — ALLOPURINOL 100 MG PO TABS: 100.0000 mg | ORAL_TABLET | Freq: Two times a day (BID) | ORAL | 1 refills | Status: DC

## 2022-02-18 MED ORDER — ATORVASTATIN CALCIUM 20 MG PO TABS: 20.0000 mg | ORAL_TABLET | Freq: Every day | ORAL | 1 refills | Status: DC

## 2022-02-18 MED ORDER — DABIGATRAN ETEXILATE MESYLATE 150 MG PO CAPS: 150.0000 mg | ORAL_CAPSULE | Freq: Two times a day (BID) | ORAL | 1 refills | Status: DC

## 2022-03-05 ENCOUNTER — Ambulatory Visit: Payer: Medicare PPO | Admitting: Physician Assistant

## 2022-03-13 ENCOUNTER — Encounter: Payer: Self-pay | Admitting: Physician Assistant

## 2022-03-13 ENCOUNTER — Ambulatory Visit (INDEPENDENT_AMBULATORY_CARE_PROVIDER_SITE_OTHER): Payer: Medicare PPO | Admitting: Physician Assistant

## 2022-03-13 DIAGNOSIS — Z0000 Encounter for general adult medical examination without abnormal findings: Secondary | ICD-10-CM | POA: Diagnosis not present

## 2022-03-13 NOTE — Progress Notes (Signed)
Virtual Visit via Telephone Note  I connected with  Ronald Fleming on 03/13/22 at 10:00 AM EST by telephone and verified that I am speaking with the correct person using two identifiers.  Location: Patient: at home  Provider: [place], [place]     I discussed the limitations, risks, security and privacy concerns of performing an evaluation and management service by telephone and the availability of in person appointments. The patient expressed understanding and agreed to proceed.  Interactive audio and video telecommunications were attempted between this nurse and patient, however failed, due to patient having technical difficulties OR patient did not have access to video capability.  We continued and completed visit with audio only.  Some vital signs may be absent or patient reported.     Subjective:   Ronald Fleming is a 86 y.o. male who presents for Medicare Annual/Subsequent preventive examination.  Review of Systems:   Cardiac Risk Factors include: advanced age (>69mn, >>62women);male gender;hypertension;dyslipidemia     Objective:    Vitals: There were no vitals taken for this visit.  There is no height or weight on file to calculate BMI.     03/01/2021    9:35 AM 02/29/2020    9:47 AM 02/23/2020    9:02 AM 02/23/2019   10:31 AM 02/20/2018    8:49 AM 10/28/2017    3:31 PM 12/09/2016    1:55 PM  Advanced Directives  Does Patient Have a Medical Advance Directive? Yes Yes No Yes Yes Yes Yes  Type of AParamedicof AIoneLiving will HPrivateerLiving will  HLincolnLiving will HDeweeseLiving will HMenlo ParkLiving will HWauzekaLiving will  Does patient want to make changes to medical advance directive?      No - Patient declined   Copy of HFultonin Chart? No - copy requested No - copy requested  No -  copy requested No - copy requested No - copy requested No - copy requested  Would patient like information on creating a medical advance directive?   No - Patient declined        Tobacco Social History   Tobacco Use  Smoking Status Never  Smokeless Tobacco Never     Counseling given: Not Answered   Clinical Intake:  Pre-visit preparation completed: Yes  Pain : No/denies pain     Diabetes: No  How often do you need to have someone help you when you read instructions, pamphlets, or other written materials from your doctor or pharmacy?: 1 - Never What is the last grade level you completed in school?: 12  Interpreter Needed?: No     Past Medical History:  Diagnosis Date   Arrhythmia    Arthritis    Benign positional vertigo    BPH (benign prostatic hyperplasia)    Chronic kidney disease, stage III (moderate) (HCC)    Deep vein blood clot of right lower extremity ([place]    Dementia ([place]    DVT (deep venous thrombosis) ([place]    Dysrhythmia    ApFib   Glaucoma    Hyperlipidemia    Hypertension    Nephrolithiasis    Neuropathy    Past Surgical History:  Procedure Laterality Date   COLONOSCOPY     HERNIA REPAIR  11/09/2017   INGUINAL HERNIA REPAIR Right 11/10/2017   Procedure: LAPAROSCOPIC RIGHT  INGUINAL HERNIA REPAIR;  Surgeon: RRalene Ok  MD;  Location: [facility];  Service: General;  Laterality: Right;   INGUINAL HERNIA REPAIR Left 02/23/2020   Procedure: LAPAROSCOPIC LEFT  INGUINAL HERNIA WITH MESH;  Surgeon: Ralene Ok, MD;  Location: [facility];  Service: General;  Laterality: Left;   INSERTION OF MESH Right 11/10/2017   Procedure: INSERTION OF MESH;  Surgeon: Ralene Ok, MD;  Location: [facility];  Service: General;  Laterality: Right;   Family History  Problem Relation Age of Onset   Coronary artery disease Father    Stroke Son    Kidney disease Neg Hx    Prostate cancer Neg Hx    Bladder Cancer Neg Hx    Social History   Socioeconomic History    Marital status: Married    Spouse name: Risk analyst   Number of children: 3   Years of education: Not on file   Highest education level: 12th grade  Occupational History   Occupation: retired    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    They have two children that live in [place] and one son that lives in [place].   He likes to work in his [place] Strain: Low Risk  (03/01/2021)   Overall Financial Resource Strain (CARDIA)    Difficulty of Paying Living Expenses: Not very hard  Food Insecurity: No Food Insecurity (03/01/2021)   Hunger Vital Sign    Worried About Running Out of Food in the Last Year: Never true    Ran Out of Food in the Last Year: Never true  Transportation Needs: No Transportation Needs (03/01/2021)   PRAPARE - Hydrologist (Medical): No    Lack of Transportation (Non-Medical): No  Physical Activity: Insufficiently Active (03/01/2021)   Exercise Vital Sign    Days of Exercise per Week: 5 days    Minutes of Exercise per Session: 20 min  Stress: No Stress Concern Present (03/01/2021)   [place]    Feeling of Stress : Not at all  Social Connections: Moderately Isolated (03/01/2021)   Social Connection and Isolation Panel [NHANES]    Frequency of Communication with Friends and Family: More than three times a week    Frequency of Social Gatherings with Friends and Family: More than three times a week    Attends Religious  Services: Never    Marine scientist or Organizations: No    Attends Archivist Meetings: Never    Marital Status: Married    Outpatient Encounter Medications as of 03/13/2022  Medication Sig   allopurinol (ZYLOPRIM) 100 MG tablet Take 1 tablet (100 mg total) by mouth 2 (two) times daily.   atorvastatin (LIPITOR) 20 MG tablet Take 1 tablet (20 mg total) by mouth daily.   Cholecalciferol (VITAMIN D) 50 MCG (2000 UT) CAPS Take 1 capsule (2,000 Units total) by mouth daily.   dabigatran (PRADAXA) 150 MG CAPS capsule Take 1 capsule (150 mg total)  by mouth 2 (two) times daily.   furosemide (LASIX) 20 MG tablet Take 1 tablet by mouth in the morning.   losartan (COZAAR) 25 MG tablet Take 1 tablet (25 mg total) by mouth daily.   tamsulosin (FLOMAX) 0.4 MG CAPS capsule TAKE 1 CAPSULE EVERY DAY   vitamin B-12 (CYANOCOBALAMIN) 1000 MCG tablet Take 2,000 mcg by mouth 3 (three) times a week.    No facility-administered encounter medications on file as of 03/13/2022.    Activities of Daily Living    03/13/2022    9:45 AM 03/13/2022    9:32 AM  In your present state of health, do you have any difficulty performing the following activities:  Hearing? 1 1  Vision? 0 0  Difficulty concentrating or making decisions? 0 0  Walking or climbing stairs? 1 1  Dressing or bathing? 0 0  Doing errands, shopping? 0 0  Preparing Food and eating ? N   Using the Toilet? N   In the past six months, have you accidently leaked urine? N   Do you have problems with loss of bowel control? N   Managing your Medications? N   Managing your Finances? N   Housekeeping or managing your Housekeeping? N     Patient Care Team: Steele Sizer, MD as PCP - General (Family Medicine) Vickie Epley, MD as PCP - Electrophysiology (Cardiology) Yolonda Kida, MD as Consulting Physician (Cardiology) Vladimir Crofts, MD as Consulting Physician (Neurology) Ardis Hughs, MD as Attending Physician  (Urology)   Assessment:   This is a routine wellness examination for [place].  Exercise Activities and Dietary recommendations Current Exercise Habits: The patient does not participate in regular exercise at present   Goals Addressed   None     Fall Risk:    03/13/2022    9:32 AM 02/18/2022    9:46 AM 10/19/2021   10:29 AM 09/19/2021    2:56 PM 08/01/2021   10:30 AM  Fall Risk   Falls in the past year? 0 0 '1 1 1  '$ Number falls in past yr: 0 0 '1 1 1  '$ Injury with Fall? 0 0 0 1 0  Risk for fall due to :  Impaired balance/gait Impaired balance/gait Impaired balance/gait;Impaired mobility History of fall(s);Impaired balance/gait  Follow up  Falls prevention discussed Falls prevention discussed  Falls prevention discussed;Education provided;Falls evaluation completed    FALL RISK PREVENTION PERTAINING TO THE HOME:  Any stairs in or around the home? Yes  If so, are there any without handrails? Yes   Home free of loose throw rugs in walkways, pet beds, electrical cords, etc? Yes  Adequate lighting in your home to reduce risk of falls? Yes   ASSISTIVE DEVICES UTILIZED TO PREVENT FALLS:  Life alert? No  Use of a cane, walker or w/c? Yes  cane Grab bars in the bathroom? Yes  Shower chair or bench in shower? No  Elevated toilet seat or a handicapped toilet? No   TIMED UP AND GO:  Was the test performed? No .  Not in office telephone visit   GAIT:  Appearance of gait: not in office telephone visit  Depression Screen    03/13/2022    9:32 AM 02/18/2022    9:46 AM 10/19/2021   10:29 AM 09/19/2021    2:57 PM  PHQ 2/9 Scores  PHQ - 2 Score 0 0 0 0  PHQ- 9 Score 0 0 0 0    Cognitive Function  03/13/2022    9:47 AM 02/29/2020    9:49 AM 02/23/2019   10:34 AM 02/20/2018    8:53 AM  6CIT Screen  What Year? 0 points 0 points 0 points 0 points  What month? 0 points 0 points 0 points 0 points  What time? 0 points 0 points 0 points 0 points  Count back from 20 0 points 0  points 0 points 0 points  Months in reverse 2 points 0 points 0 points 0 points  Repeat phrase 0 points 0 points 6 points 0 points  Total Score 2 points 0 points 6 points 0 points    Immunization History  Administered Date(s) Administered   Fluad Quad(high Dose 65+) 12/07/2018, 12/01/2019, 01/08/2021, 12/12/2021   Influenza Split 03/25/2008, 12/19/2008, 11/29/2009   Influenza, High Dose Seasonal PF 04/17/2015, 12/29/2015, 12/09/2016, 12/31/2017   Influenza, Seasonal, Injecte, Preservative Fre 12/18/2010, 12/06/2011   Moderna Covid-19 Vaccine Bivalent Booster 64yr & up 12/18/2020   Moderna Sars-Covid-2 Vaccination 05/14/2019, 06/15/2019, 03/06/2020, 07/24/2020, 02/06/2022   Pneumococcal Conjugate-13 05/25/2013   Pneumococcal Polysaccharide-23 11/29/2009   Tdap 03/28/2010, 08/01/2021   Zoster, Live 11/09/2010    Qualifies for Shingles Vaccine? Yes  Zostavax completed 11/09/10. Due for Shingrix.   Tdap:  Up to date 08/01/21  Flu Vaccine:  Up to date 12/12/21  Pneumococcal Vaccine: Due for Pneumococcal vaccine. Has had 23:11/29/09 and 13:05/25/13  Covid-19 Vaccine: Up to date   Screening Tests Health Maintenance  Topic Date Due   Medicare Annual Wellness (AWV)  03/01/2022   Zoster Vaccines- Shingrix (1 of 2) 05/17/2022 (Originally 11/[birth year]/1987)   COVID-19 Vaccine (7 - 2023-24 season) 04/03/2022   DTaP/Tdap/Td (3 - Td or Tdap) 08/02/2031   Pneumonia Vaccine 86 Years old  Completed   INFLUENZA VACCINE  Completed   HPV VACCINES  Aged Out   Cancer Screenings:  Colorectal Screening: No longer required. Lung Cancer Screening: (Low Dose CT Chest recommended if Age 746-80years, 30 pack-year currently smoking OR have quit w/in 15years.) does not qualify.    Additional Screening:  Hepatitis C Screening: does not qualify  Vision Screening: Recommended annual ophthalmology exams for early detection of glaucoma and other disorders of the eye. Is the patient up to date with their annual  eye exam?  Yes  Who is the provider or what is the name of the office in which the pt attends annual eye exams?  [facility] Dental Screening: Recommended annual dental exams for proper oral hygiene. Dentures  Community Resource Referral:  CRR required this visit?  No        Plan:  I have personally reviewed and addressed the Medicare Annual Wellness questionnaire and have noted the following in the patient's chart:  A. Medical and social history B. Use of alcohol, tobacco or illicit drugs  C. Current medications and supplements D. Functional ability and status E.  Nutritional status F.  Physical activity G. Advance directives H. List of other physicians I.  Hospitalizations, surgeries, and ER visits in previous 12 months J.  VBruslysuch as hearing and vision if needed, cognitive and depression L. Referrals and appointments   In addition, I have reviewed and discussed with patient certain preventive protocols, quality metrics, and best practice recommendations. A written personalized care plan for preventive services as well as general preventive health recommendations were provided to patient.   Signed,   ETalitha Givens MHS, PA-C [facility]

## 2022-05-07 DIAGNOSIS — I82501 Chronic embolism and thrombosis of unspecified deep veins of right lower extremity: Secondary | ICD-10-CM | POA: Diagnosis not present

## 2022-05-07 DIAGNOSIS — I498 Other specified cardiac arrhythmias: Secondary | ICD-10-CM | POA: Diagnosis not present

## 2022-05-07 DIAGNOSIS — E7800 Pure hypercholesterolemia, unspecified: Secondary | ICD-10-CM | POA: Diagnosis not present

## 2022-05-07 DIAGNOSIS — I10 Essential (primary) hypertension: Secondary | ICD-10-CM | POA: Diagnosis not present

## 2022-05-07 DIAGNOSIS — N1831 Chronic kidney disease, stage 3a: Secondary | ICD-10-CM | POA: Diagnosis not present

## 2022-05-07 DIAGNOSIS — G4733 Obstructive sleep apnea (adult) (pediatric): Secondary | ICD-10-CM | POA: Diagnosis not present

## 2022-05-07 DIAGNOSIS — I420 Dilated cardiomyopathy: Secondary | ICD-10-CM | POA: Diagnosis not present

## 2022-05-07 DIAGNOSIS — I480 Paroxysmal atrial fibrillation: Secondary | ICD-10-CM | POA: Diagnosis not present

## 2022-06-18 NOTE — Progress Notes (Unsigned)
Name: Ronald Fleming   MRN: [id number]    DOB: [birth year]   Date:06/19/2022       Progress Note  Subjective  Chief Complaint  Follow Up  HPI  Bradycardia: patient has home health and noticed his heart rate was low, he followed up with Dr. Clayborn Bigness  and heart rate on arrival was 38 but on EKG - per wife - heart rate was in the 70's. Patient has been asymptomatic seen by Dr. Clayborn Bigness, heart rate today  is 82   OA both knees with gait instability: he is doing better,  completed  PT , he now is using a cane to help with balance. He takes Tylenol prn Pain level today is 1/10   HTN: No chest pain he has occasional palpation. He denies dizziness, he is on lower dose of Losartan 25 mg and bp today is 118/68, advised to monitor bp at home and if remains low and also low on next visit we will stop losartan due to risk of falls.   Cardiomyopathy: under the care of Dr. Placido Sou lower dose of losartan - currently at 25 mg daily - also on lasix due to cardiomyopathy with EF down to 40 %. He has SOB with activity, denies chest pain, he denies orthopnea. Stable   INTERPRETATION 0000000 MILD LV SYSTOLIC DYSFUNCTION (See above)   WITH MODERATE LVH  NORMAL RIGHT VENTRICULAR SYSTOLIC FUNCTION  MODERATE VALVULAR REGURGITATION (See above)  NO VALVULAR STENOSIS  IRREGULAR HEART RHYTHM CAPTURED THROUGHOUT EXAM  ESTIMATED LVEF 40%  Aortic: MODERATE AI  Mitral: MODERATE - SEVERE MR; MAX VEL. 4.44m/s  Tricuspid: MODERATE TR (3.56m/s)  Pulmonic: MILD PI  MODERATE LAE  MILD RAE  MILDLY DILATED ASCENDING AORTA MEASURING 3.8cm  MILDLY DILATED AORTIC ROOT MEASURING 3.8cm   CKI stage III: stable, urine micro negative, denies pruritis, on ARB advised to stay off NSAID's. Last lab was stable - GFR at 53  urine micro negative Unchanged   Hyperlipidemia: taking statins, denies side effects, last LDL went up from 66 to 84, he states compliant with medication, he is not sure why last level was high He also has  atherosclerosis of aorta, is on statin therapy , off aspirin, but takes pradaxa daily. We will recheck level next visit   Alzheimer's late onset: wife noticed he had memory loss, seeing Dr Manuella Ghazi ( Neurologist ) and is now on Razadyne 12mg  twice daily e has been on medication since Fall 2017, he chose to stop medication. Since wife had brain surgery 2021 , both sons are now living in town, and Tula lives next door and checks on them daily, cooking and helping around the house He is still able to perform ADL on his own, still pays his bills , he also still drives and has not gotten lost - he never leaves his house by himself  Pre-diabetes:  he denies polyphagia, polydipsia, he has BPH and takes lasix daily and that also increases urinary frequency     Afib: he states doing well, occasional palpitation and SOB with activity but denies chest pain,  he is on Pradaxa and denies easy bleeding or bruising. Sees Dr Clayborn Bigness, he has been off aspirin because of risk of bleeding but is on pradaxa Unchanged    BPH: he has urinary frequency during the day,  he also has nocturia two to three times daily, but able to fall back asleep, he has night lights now,  he is on Flomax and sees Dr. Bernardo Heater. He has  a history of hematuria secondary to kidney stones but no recent episodes. Unchanged    Pancreas cyst: found on CT for stone search, had repeat MRI 10/2018 and repeat was done 10/2019 . He has good appetite, no nausea, vomiting or abdominal pain. He continues to be symptoms free, and not willing to have more scans or follow ups for that   IMPRESSION: 2.4 cm unilocular cystic lesion in the pancreatic body, showing progressive increase in size from 1.3 cm on 2019 exam. EUS/FNA should be considered for further evaluation. This recommendation follows ACR consensus guidelines: Management of Incidental Pancreatic Cysts: A White Paper of the ACR Incidental Findings Committee. [place] [id number].    Chronic DVT right leg, also has varicose veins, no pain or discomfort at this time, he has intermittent right lower extremity edema that resolves with compression stocking hoses . He is doing fine at this time, still on Pradaxa due to Afib  Gout: no recent episodes, but last uric acid was not at goal , he is now taking allopurinol bid and we will recheck labs next visit   Patient Active Problem List   Diagnosis Date Noted   Gait instability 08/01/2021   Primary osteoarthritis of both knees 08/01/2021   DDD (degenerative disc disease), lumbosacral 09/11/2017   Nephrolithiasis 09/11/2017   Vitamin D deficiency 09/11/2017   Lesion of pancreas 07/09/2017   Atherosclerosis of aorta 07/01/2017   Osteopenia 05/21/2017   Atrioventricular block, second degree 05/29/2016   Vitamin B12 deficiency 05/21/2016   Late onset Alzheimer's disease without behavioral disturbance 05/21/2016   Vitiligo 02/12/2016   BPH (benign prostatic hyperplasia) 08/03/2015   Atrial fibrillation 02/01/2015   Neuropathy 08/23/2014   Benign essential HTN 08/27/2006   Hypercholesterolemia without hypertriglyceridemia 08/27/2006    Past Surgical History:  Procedure Laterality Date   COLONOSCOPY     HERNIA REPAIR  11/09/2017   INGUINAL HERNIA REPAIR Right 11/10/2017   Procedure: LAPAROSCOPIC RIGHT  INGUINAL HERNIA REPAIR;  Surgeon: Ralene Ok, MD;  Location: [facility];  Service: General;  Laterality: Right;   INGUINAL HERNIA REPAIR Left 02/23/2020   Procedure: LAPAROSCOPIC LEFT  INGUINAL HERNIA WITH MESH;  Surgeon: Ralene Ok, MD;  Location: [facility];  Service: General;  Laterality: Left;   INSERTION OF MESH Right 11/10/2017   Procedure: INSERTION OF MESH;  Surgeon: Ralene Ok, MD;  [facility];  Service: General;  Laterality: Right;    Family History  Problem Relation Age of Onset   Coronary artery disease Father    Stroke Son    Kidney disease Neg Hx    Prostate cancer Neg Hx    Bladder Cancer Neg  Hx     Social History   Tobacco Use   Smoking status: Never   Smokeless tobacco: Never  Substance Use Topics   Alcohol use: Never    Alcohol/week: 0.0 standard drinks of alcohol     Current Outpatient Medications:    allopurinol (ZYLOPRIM) 100 MG tablet, Take 1 tablet (100 mg total) by mouth 2 (two) times daily., Disp: 180 tablet, Rfl: 1   atorvastatin (LIPITOR) 20 MG tablet, Take 1 tablet (20 mg total) by mouth daily., Disp: 90 tablet, Rfl: 1   Cholecalciferol (VITAMIN D) 50 MCG (2000 UT) CAPS, Take 1 capsule (2,000 Units total) by mouth daily., Disp: 100 capsule, Rfl: 3   dabigatran (PRADAXA) 150 MG CAPS capsule, Take 1 capsule (150 mg total) by mouth 2 (two) times daily., Disp: 180 capsule, Rfl: 1  furosemide (LASIX) 20 MG tablet, Take 1 tablet by mouth in the morning., Disp: , Rfl:    losartan (COZAAR) 25 MG tablet, Take 1 tablet (25 mg total) by mouth daily., Disp: 90 tablet, Rfl: 1   tamsulosin (FLOMAX) 0.4 MG CAPS capsule, TAKE 1 CAPSULE EVERY DAY, Disp: 90 capsule, Rfl: 1   vitamin B-12 (CYANOCOBALAMIN) 1000 MCG tablet, Take 2,000 mcg by mouth 3 (three) times a week. , Disp: , Rfl:   No Known Allergies  I personally reviewed active problem list, medication list, allergies, family history, social history, health maintenance with the patient/caregiver today.   ROS  Ten systems reviewed and is negative except as mentioned in HPI   Objective  Vitals:   06/19/22 0945  BP: 118/68  Pulse: 82  Resp: 16  SpO2: 96%  Weight: 191 lb (86.6 kg)  Height: 5\' 11"  (1.803 m)    Body mass index is 26.64 kg/m.  Physical Exam  Constitutional: Patient appears well-developed and well-nourished.  No distress.  HEENT: head atraumatic, normocephalic, pupils equal and reactive to light, neck supple Cardiovascular: Normal rate, regular rhythm and normal heart sounds.  No murmur heard. No BLE edema. Pulmonary/Chest: Effort normal and breath sounds normal. No respiratory  distress. Abdominal: Soft.  There is no tenderness. Muscular Skeletal: using a cane Psychiatric: Patient has a normal mood and affect. behavior is normal. Judgment and thought content normal.     PHQ2/9:    06/19/2022    9:45 AM 03/13/2022    9:32 AM 02/18/2022    9:46 AM 10/19/2021   10:29 AM 09/19/2021    2:57 PM  Depression screen PHQ 2/9  Decreased Interest 0 0 0 0 0  Down, Depressed, Hopeless 0 0 0 0 0  PHQ - 2 Score 0 0 0 0 0  Altered sleeping 0 0 0 0 0  Tired, decreased energy 0 0 0 0 0  Change in appetite 0 0 0 0 0  Feeling bad or failure about yourself  0 0 0 0 0  Trouble concentrating 0 0 0 0 0  Moving slowly or fidgety/restless 0 0 0 0 0  Suicidal thoughts 0 0 0 0 0  PHQ-9 Score 0 0 0 0 0  Difficult doing work/chores  Not difficult at all   Not difficult at all    phq 9 is negative   Fall Risk:    06/19/2022    9:45 AM 03/13/2022    9:32 AM 02/18/2022    9:46 AM 10/19/2021   10:29 AM 09/19/2021    2:56 PM  Fall Risk   Falls in the past year? 0 0 0 1 1  Number falls in past yr: 0 0 0 1 1  Injury with Fall? 0 0 0 0 1  Risk for fall due to : No Fall Risks  Impaired balance/gait Impaired balance/gait Impaired balance/gait;Impaired mobility  Follow up Falls prevention discussed  Falls prevention discussed Falls prevention discussed       Functional Status Survey: Is the patient deaf or have difficulty hearing?: No Does the patient have difficulty seeing, even when wearing glasses/contacts?: No Does the patient have difficulty concentrating, remembering, or making decisions?: No Does the patient have difficulty walking or climbing stairs?: Yes Does the patient have difficulty dressing or bathing?: No Does the patient have difficulty doing errands alone such as visiting a doctor's office or shopping?: No    Assessment & Plan  1. Dilated cardiomyopathy  Under the care of cardiologist  2. Vitamin B12 deficiency  Taking supplements   3. Vitamin D  deficiency  Continue supplements   4. Late onset Alzheimer's disease without behavioral disturbance  Off medications and stable   5. Paroxysmal atrial fibrillation  Rate controlled   6. Stage 3a chronic kidney disease  We are checking labs yearly   7. Atherosclerosis of aorta  On statin therapy   8. Primary osteoarthritis of both knees  Doing better, uses a cane, has handicap forms  9. Gait instability  Unchanged   10. Chronic deep vein thrombosis (DVT) of right femoral vein  On pradaxa and no side effects   11. Benign prostatic hyperplasia (BPH) with urinary urgency  Under the care of urologist   12. Controlled gout  Taking higher dose of allopurinol and no flares   13. Bradycardia  Evaluated by [facility]   14. Pancreatic cyst

## 2022-06-19 ENCOUNTER — Ambulatory Visit (INDEPENDENT_AMBULATORY_CARE_PROVIDER_SITE_OTHER): Payer: Medicare PPO | Admitting: Family Medicine

## 2022-06-19 ENCOUNTER — Encounter: Payer: Self-pay | Admitting: Family Medicine

## 2022-06-19 VITALS — BP 118/68 | HR 82 | Resp 16 | Ht 71.0 in | Wt 191.0 lb

## 2022-06-19 DIAGNOSIS — R3915 Urgency of urination: Secondary | ICD-10-CM

## 2022-06-19 DIAGNOSIS — K862 Cyst of pancreas: Secondary | ICD-10-CM

## 2022-06-19 DIAGNOSIS — E538 Deficiency of other specified B group vitamins: Secondary | ICD-10-CM

## 2022-06-19 DIAGNOSIS — G301 Alzheimer's disease with late onset: Secondary | ICD-10-CM | POA: Diagnosis not present

## 2022-06-19 DIAGNOSIS — M170 Bilateral primary osteoarthritis of knee: Secondary | ICD-10-CM

## 2022-06-19 DIAGNOSIS — R001 Bradycardia, unspecified: Secondary | ICD-10-CM

## 2022-06-19 DIAGNOSIS — I480 Paroxysmal atrial fibrillation: Secondary | ICD-10-CM | POA: Diagnosis not present

## 2022-06-19 DIAGNOSIS — I420 Dilated cardiomyopathy: Secondary | ICD-10-CM | POA: Diagnosis not present

## 2022-06-19 DIAGNOSIS — E559 Vitamin D deficiency, unspecified: Secondary | ICD-10-CM | POA: Diagnosis not present

## 2022-06-19 DIAGNOSIS — I82511 Chronic embolism and thrombosis of right femoral vein: Secondary | ICD-10-CM

## 2022-06-19 DIAGNOSIS — M109 Gout, unspecified: Secondary | ICD-10-CM

## 2022-06-19 DIAGNOSIS — R2681 Unsteadiness on feet: Secondary | ICD-10-CM | POA: Diagnosis not present

## 2022-06-19 DIAGNOSIS — F0280 Dementia in other diseases classified elsewhere without behavioral disturbance: Secondary | ICD-10-CM

## 2022-06-19 DIAGNOSIS — I700 Atherosclerosis of aorta: Secondary | ICD-10-CM

## 2022-06-19 DIAGNOSIS — N1831 Chronic kidney disease, stage 3a: Secondary | ICD-10-CM

## 2022-06-19 DIAGNOSIS — N401 Enlarged prostate with lower urinary tract symptoms: Secondary | ICD-10-CM

## 2022-06-25 ENCOUNTER — Ambulatory Visit: Payer: Medicare PPO

## 2022-06-25 VITALS — BP 128/74 | HR 64

## 2022-06-25 DIAGNOSIS — I10 Essential (primary) hypertension: Secondary | ICD-10-CM

## 2022-06-25 NOTE — Progress Notes (Signed)
Patient here for blood pressure check at last visit was low.  Per Dr. Carlynn Purl if contoniues to be low will stop Losartan.  Blood pressure today is 128/74 and pulse 64 Please advise.  Readings vary, these are his at home readings: 3/28-126/64 4/1-131/74 4/3-134-73 4/5-140/78 4/7-138/88 4/9-116/69

## 2022-07-23 ENCOUNTER — Other Ambulatory Visit: Payer: Self-pay | Admitting: Family Medicine

## 2022-07-23 DIAGNOSIS — M109 Gout, unspecified: Secondary | ICD-10-CM

## 2022-08-08 ENCOUNTER — Other Ambulatory Visit: Payer: Self-pay | Admitting: Family Medicine

## 2022-08-08 DIAGNOSIS — N401 Enlarged prostate with lower urinary tract symptoms: Secondary | ICD-10-CM

## 2022-08-19 ENCOUNTER — Other Ambulatory Visit: Payer: Self-pay | Admitting: Family Medicine

## 2022-08-19 DIAGNOSIS — I420 Dilated cardiomyopathy: Secondary | ICD-10-CM

## 2022-08-19 DIAGNOSIS — N1831 Chronic kidney disease, stage 3a: Secondary | ICD-10-CM

## 2022-08-26 ENCOUNTER — Other Ambulatory Visit: Payer: Self-pay | Admitting: Family Medicine

## 2022-08-26 DIAGNOSIS — N1831 Chronic kidney disease, stage 3a: Secondary | ICD-10-CM

## 2022-08-26 DIAGNOSIS — I480 Paroxysmal atrial fibrillation: Secondary | ICD-10-CM

## 2022-08-26 DIAGNOSIS — I420 Dilated cardiomyopathy: Secondary | ICD-10-CM

## 2022-08-26 NOTE — Telephone Encounter (Unsigned)
Copied from CRM [id number]. Topic: General - Other >> Aug 26, 2022  5:23 PM Everette C wrote: Reason for CRM: Medication Refill - Medication: dabigatran (PRADAXA) 150 MG CAPS capsule [[id number]]  losartan (COZAAR) 25 MG tablet [[id number]]  Has the patient contacted their pharmacy? Yes.  [place] with [facility] has called on behalf of the patient to request a short supply of the medication to be sent to their local pharmacy  (Agent: If no, request that the patient contact the pharmacy for the refill. If patient does not wish to contact the pharmacy document the reason why and proceed with request.) (Agent: If yes, when and what did the pharmacy advise?)  Preferred Pharmacy (with phone number or street name): [facility] [address]), [place] - [address]) [place] [address] Phone: [phone] Fax: [phone] Hours: Not open 24 hours   Has the patient been seen for an appointment in the last year OR does the patient have an upcoming appointment? Yes.    Agent: Please be advised that RX refills may take up to 3 business days. We ask that you follow-up with your pharmacy.

## 2022-08-27 ENCOUNTER — Other Ambulatory Visit: Payer: Self-pay | Admitting: Family Medicine

## 2022-08-27 DIAGNOSIS — I420 Dilated cardiomyopathy: Secondary | ICD-10-CM

## 2022-08-27 DIAGNOSIS — I480 Paroxysmal atrial fibrillation: Secondary | ICD-10-CM

## 2022-08-27 DIAGNOSIS — N1831 Chronic kidney disease, stage 3a: Secondary | ICD-10-CM

## 2022-08-27 MED ORDER — LOSARTAN POTASSIUM 25 MG PO TABS: 25.0000 mg | ORAL_TABLET | Freq: Every day | ORAL | 0 refills | Status: DC

## 2022-08-27 MED ORDER — DABIGATRAN ETEXILATE MESYLATE 150 MG PO CAPS: 150.0000 mg | ORAL_CAPSULE | Freq: Two times a day (BID) | ORAL | 0 refills | Status: DC

## 2022-08-27 NOTE — Telephone Encounter (Signed)
Requested Prescriptions  Pending Prescriptions Disp Refills   dabigatran (PRADAXA) 150 MG CAPS capsule 60 capsule 0    Sig: Take 1 capsule (150 mg total) by mouth 2 (two) times daily.     Hematology:  Anticoagulants 2 Failed - 08/26/2022  5:46 PM      Failed - Cr in normal range and within 360 days    Creat  Date Value Ref Range Status  10/19/2021 1.34 (H) 0.70 - 1.22 mg/dL Final   Creatinine, Urine  Date Value Ref Range Status  10/19/2021 152 20 - 320 mg/dL Final         Failed - ALT in normal range and within 360 days    ALT  Date Value Ref Range Status  10/19/2021 8 (L) 9 - 46 U/L Final         Passed - PLT in normal range and within 360 days    Platelets  Date Value Ref Range Status  10/19/2021 157 140 - 400 Thousand/uL Final         Passed - HGB in normal range and within 360 days    Hemoglobin  Date Value Ref Range Status  10/19/2021 13.8 13.2 - 17.1 g/dL Final         Passed - HCT in normal range and within 360 days    HCT  Date Value Ref Range Status  10/19/2021 41.1 38.5 - 50.0 % Final         Passed - AST in normal range and within 360 days    AST  Date Value Ref Range Status  10/19/2021 14 10 - 35 U/L Final         Passed - eGFR is 10 or above and within 360 days    GFR, Est African American  Date Value Ref Range Status  04/21/2020 58 (L) > OR = 60 mL/min/1.22m2 Final   GFR, Est Non African American  Date Value Ref Range Status  04/21/2020 50 (L) > OR = 60 mL/min/1.40m2 Final   eGFR  Date Value Ref Range Status  10/19/2021 53 (L) > OR = 60 mL/min/1.89m2 Final         Passed - Patient is not pregnant      Passed - Valid encounter within last 12 months    Recent Outpatient Visits           2 months ago Dilated cardiomyopathy [facility])   [place] Alba Cory, MD   5 months ago Encounter for [employer] annual wellness exam   [facility] Mecum, Erin E, PA-C   6 months ago Paroxysmal  atrial fibrillation [facility])   [facility] Alba Cory, MD   10 months ago Stage 3a chronic kidney disease [facility])   [place] [facility] Alba Cory, MD   11 months ago 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     Cardiovascular:  Angiotensin Receptor Blockers Failed - 08/26/2022  5:46 PM      Failed -  Cr in normal range and within 180 days    Creat  Date Value Ref Range Status  10/19/2021 1.34 (H) 0.70 - 1.22 mg/dL Final   Creatinine, Urine  Date Value Ref Range Status  10/19/2021 152 20 - 320 mg/dL Final         Failed - K in normal range and within 180 days    Potassium  Date Value Ref Range Status  10/19/2021 3.9 3.5 - 5.3 mmol/L Final         Passed - Patient is not pregnant      Passed - Last BP in normal range    BP Readings from Last 1 Encounters:  06/25/22 128/74         Passed - Valid encounter within last 6 months    Recent Outpatient Visits           2 months ago Dilated cardiomyopathy [facility])   [place] [facility] Alba Cory, MD   5 months ago Encounter for [employer] annual wellness exam   [facility] Mecum, Erin E, PA-C   6 months ago Paroxysmal atrial fibrillation [facility])   [place] [facility] Alba Cory, MD   10 months ago Stage 3a chronic kidney disease [facility])   [place] [facility] Alba Cory, MD   11 months ago 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

## 2022-08-27 NOTE — Telephone Encounter (Unsigned)
Copied from CRM [id number]. Topic: General - Other >> Aug 27, 2022  3:00 PM Everette C wrote: Reason for CRM: The patient's wife has called to follow up dabigatran (PRADAXA) 150 MG CAPS capsule [[id number]] and losartan (COZAAR) 25 MG tablet [[id number]]  Please contact further when possible

## 2022-08-31 ENCOUNTER — Other Ambulatory Visit: Payer: Self-pay | Admitting: Family Medicine

## 2022-08-31 DIAGNOSIS — E7800 Pure hypercholesterolemia, unspecified: Secondary | ICD-10-CM

## 2022-10-17 ENCOUNTER — Ambulatory Visit (INDEPENDENT_AMBULATORY_CARE_PROVIDER_SITE_OTHER): Payer: Medicare PPO

## 2022-10-17 VITALS — Ht 71.0 in | Wt 191.0 lb

## 2022-10-17 DIAGNOSIS — Z0000 Encounter for general adult medical examination without abnormal findings: Secondary | ICD-10-CM | POA: Diagnosis not present

## 2022-10-17 NOTE — Patient Instructions (Signed)
Ronald Fleming , Thank you for taking time to come for your Medicare Wellness Visit. I appreciate your ongoing commitment to your health goals. Please review the following plan we discussed and let me know if I can assist you in the future.   Referrals/Orders/Follow-Ups/Clinician Recommendations: none  This is a list of the screening recommended for you and due dates:  Health Maintenance  Topic Date Due   Zoster (Shingles) Vaccine (1 of 2) 11/09/1987   COVID-19 Vaccine (7 - 2023-24 season) 04/03/2022   Flu Shot  10/17/2022   Medicare Annual Wellness Visit  10/17/2023   DTaP/Tdap/Td vaccine (3 - Td or Tdap) 08/02/2031   Pneumonia Vaccine  Completed   HPV Vaccine  Aged Out    Advanced directives: (Copy Requested) Please bring a copy of your health care power of attorney and living will to the office to be added to your chart at your convenience.  Next Medicare Annual Wellness Visit scheduled for next year: Yes 10/23/23 @ 11:45am telephone  Preventive Care 65 Years and Older, Male  Preventive care refers to lifestyle choices and visits with your health care provider that can promote health and wellness. What does preventive care include? A yearly physical exam. This is also called an annual well check. Dental exams once or twice a year. Routine eye exams. Ask your health care provider how often you should have your eyes checked. Personal lifestyle choices, including: Daily care of your teeth and gums. Regular physical activity. Eating a healthy diet. Avoiding tobacco and drug use. Limiting alcohol use. Practicing safe sex. Taking low doses of aspirin every day. Taking vitamin and mineral supplements as recommended by your health care provider. What happens during an annual well check? The services and screenings done by your health care provider during your annual well check will depend on your age, overall health, lifestyle risk factors, and family history of disease. Counseling  Your  health care provider may ask you questions about your: Alcohol use. Tobacco use. Drug use. Emotional well-being. Home and relationship well-being. Sexual activity. Eating habits. History of falls. Memory and ability to understand (cognition). Work and work Astronomer. Screening  You may have the following tests or measurements: Height, weight, and BMI. Blood pressure. Lipid and cholesterol levels. These may be checked every 5 years, or more frequently if you are over 56 years old. Skin check. Lung cancer screening. You may have this screening every year starting at age 76 if you have a 30-pack-year history of smoking and currently smoke or have quit within the past 15 years. Fecal occult blood test (FOBT) of the stool. You may have this test every year starting at age 31. Flexible sigmoidoscopy or colonoscopy. You may have a sigmoidoscopy every 5 years or a colonoscopy every 10 years starting at age 85. Prostate cancer screening. Recommendations will vary depending on your family history and other risks. Hepatitis C blood test. Hepatitis B blood test. Sexually transmitted disease (STD) testing. Diabetes screening. This is done by checking your blood sugar (glucose) after you have not eaten for a while (fasting). You may have this done every 1-3 years. Abdominal aortic aneurysm (AAA) screening. You may need this if you are a current or former smoker. Osteoporosis. You may be screened starting at age 51 if you are at high risk. Talk with your health care provider about your test results, treatment options, and if necessary, the need for more tests. Vaccines  Your health care provider may recommend certain vaccines, such as: Influenza  vaccine. This is recommended every year. Tetanus, diphtheria, and acellular pertussis (Tdap, Td) vaccine. You may need a Td booster every 10 years. Zoster vaccine. You may need this after age 36. Pneumococcal 13-valent conjugate (PCV13) vaccine. One dose  is recommended after age 32. Pneumococcal polysaccharide (PPSV23) vaccine. One dose is recommended after age 28. Talk to your health care provider about which screenings and vaccines you need and how often you need them. This information is not intended to replace advice given to you by your health care provider. Make sure you discuss any questions you have with your health care provider. Document Released: 03/31/2015 Document Revised: 11/22/2015 Document Reviewed: 01/03/2015 Elsevier Interactive Patient Education  2017 [employer].  Fall Prevention in the Home Falls can cause injuries. They can happen to people of all ages. There are many things you can do to make your home safe and to help prevent falls. What can I do on the outside of my home? Regularly fix the edges of walkways and driveways and fix any cracks. Remove anything that might make you trip as you walk through a door, such as a raised step or threshold. Trim any bushes or trees on the path to your home. Use bright outdoor lighting. Clear any walking paths of anything that might make someone trip, such as rocks or tools. Regularly check to see if handrails are loose or broken. Make sure that both sides of any steps have handrails. Any raised decks and porches should have guardrails on the edges. Have any leaves, snow, or ice cleared regularly. Use sand or salt on walking paths during winter. Clean up any spills in your garage right away. This includes oil or grease spills. What can I do in the bathroom? Use night lights. Install grab bars by the toilet and in the tub and shower. Do not use towel bars as grab bars. Use non-skid mats or decals in the tub or shower. If you need to sit down in the shower, use a plastic, non-slip stool. Keep the floor dry. Clean up any water that spills on the floor as soon as it happens. Remove soap buildup in the tub or shower regularly. Attach bath mats securely with double-sided non-slip rug  tape. Do not have throw rugs and other things on the floor that can make you trip. What can I do in the bedroom? Use night lights. Make sure that you have a light by your bed that is easy to reach. Do not use any sheets or blankets that are too big for your bed. They should not hang down onto the floor. Have a firm chair that has side arms. You can use this for support while you get dressed. Do not have throw rugs and other things on the floor that can make you trip. What can I do in the kitchen? Clean up any spills right away. Avoid walking on wet floors. Keep items that you use a lot in easy-to-reach places. If you need to reach something above you, use a strong step stool that has a grab bar. Keep electrical cords out of the way. Do not use floor polish or wax that makes floors slippery. If you must use wax, use non-skid floor wax. Do not have throw rugs and other things on the floor that can make you trip. What can I do with my stairs? Do not leave any items on the stairs. Make sure that there are handrails on both sides of the stairs and use them. Fix  handrails that are broken or loose. Make sure that handrails are as long as the stairways. Check any carpeting to make sure that it is firmly attached to the stairs. Fix any carpet that is loose or worn. Avoid having throw rugs at the top or bottom of the stairs. If you do have throw rugs, attach them to the floor with carpet tape. Make sure that you have a light switch at the top of the stairs and the bottom of the stairs. If you do not have them, ask someone to add them for you. What else can I do to help prevent falls? Wear shoes that: Do not have high heels. Have rubber bottoms. Are comfortable and fit you well. Are closed at the toe. Do not wear sandals. If you use a stepladder: Make sure that it is fully opened. Do not climb a closed stepladder. Make sure that both sides of the stepladder are locked into place. Ask someone to  hold it for you, if possible. Clearly mark and make sure that you can see: Any grab bars or handrails. First and last steps. Where the edge of each step is. Use tools that help you move around (mobility aids) if they are needed. These include: Canes. Walkers. Scooters. Crutches. Turn on the lights when you go into a dark area. Replace any light bulbs as soon as they burn out. Set up your furniture so you have a clear path. Avoid moving your furniture around. If any of your floors are uneven, fix them. If there are any pets around you, be aware of where they are. Review your medicines with your doctor. Some medicines can make you feel dizzy. This can increase your chance of falling. Ask your doctor what other things that you can do to help prevent falls. This information is not intended to replace advice given to you by your health care provider. Make sure you discuss any questions you have with your health care provider. Document Released: 12/29/2008 Document Revised: 08/10/2015 Document Reviewed: 04/08/2014 Elsevier Interactive Patient Education  2017 [employer].

## 2022-10-17 NOTE — Progress Notes (Signed)
Subjective:   Ronald Fleming is a 87 y.o. male who presents for Medicare Annual/Subsequent preventive examination.  Visit Complete: Virtual  I connected with  Ronald Fleming on 10/17/22 by a audio enabled telemedicine application and verified that I am speaking with the correct person using two identifiers.  Patient Location: Home  Provider Location: Office/Clinic  I discussed the limitations of evaluation and management by telemedicine. The patient expressed understanding and agreed to proceed.  Vital Signs: Unable to obtain new vitals due to this being a telehealth visit.  Patient Medicare AWV questionnaire was completed by the patient on (not done); I have confirmed that all information answered by patient is correct and no changes since this date.  Review of Systems    Cardiac Risk Factors include: advanced age (>65men, >83 women);dyslipidemia;male gender;hypertension     Objective:    Today's Vitals   10/17/22 1135  Weight: 191 lb (86.6 kg)  Height: 5\' 11"  (1.803 m)   Body mass index is 26.64 kg/m.     10/17/2022   11:44 AM 03/01/2021    9:35 AM 02/29/2020    9:47 AM 02/23/2020    9:02 AM 02/23/2019   10:31 AM 02/20/2018    8:49 AM 10/28/2017    3:31 PM  Advanced Directives  Does Patient Have a Medical Advance Directive? Yes Yes Yes No Yes Yes Yes  Type of Estate agent of [place];Living will Healthcare Power of [place];Living will Healthcare Power of [place];Living will  Healthcare Power of [place];Living will Healthcare Power of [place];Living will Healthcare Power of [place];Living will  Does patient want to make changes to medical advance directive?       No - Patient declined  Copy of Healthcare Power of Attorney in Chart?  No - copy requested No - copy requested  No - copy requested No - copy requested No - copy requested  Would patient like information on creating a medical advance directive?    No - Patient declined       Current  Medications (verified) Outpatient Encounter Medications as of 10/17/2022  Medication Sig   allopurinol (ZYLOPRIM) 100 MG tablet TAKE 1 TABLET TWICE DAILY   atorvastatin (LIPITOR) 20 MG tablet TAKE 1 TABLET EVERY DAY   Cholecalciferol (VITAMIN D) 50 MCG (2000 UT) CAPS Take 1 capsule (2,000 Units total) by mouth daily.   dabigatran (PRADAXA) 150 MG CAPS capsule Take 1 capsule (150 mg total) by mouth 2 (two) times daily.   furosemide (LASIX) 20 MG tablet Take 1 tablet by mouth in the morning.   losartan (COZAAR) 25 MG tablet Take 1 tablet (25 mg total) by mouth daily.   tamsulosin (FLOMAX) 0.4 MG CAPS capsule TAKE 1 CAPSULE EVERY DAY   vitamin B-12 (CYANOCOBALAMIN) 1000 MCG tablet Take 2,000 mcg by mouth 3 (three) times a week.    No facility-administered encounter medications on file as of 10/17/2022.    Allergies (verified) Patient has no known allergies.   History: Past Medical History:  Diagnosis Date   Arrhythmia    Arthritis    Benign positional vertigo    BPH (benign prostatic hyperplasia)    Chronic kidney disease, stage III (moderate) (HCC)    Deep vein blood clot of right lower extremity (HCC)    Dementia (HCC)    DVT (deep venous thrombosis) (HCC)    Dysrhythmia    ApFib   Glaucoma    Hyperlipidemia    Hypertension    Nephrolithiasis    Neuropathy  Past Surgical History:  Procedure Laterality Date   COLONOSCOPY     HERNIA REPAIR  11/09/2017   INGUINAL HERNIA REPAIR Right 11/10/2017   Procedure: LAPAROSCOPIC RIGHT  INGUINAL HERNIA REPAIR;  Surgeon: Axel Filler, MD;  Location: [facility] OR;  Service: General;  Laterality: Right;   INGUINAL HERNIA REPAIR Left 02/23/2020   Procedure: LAPAROSCOPIC LEFT  INGUINAL HERNIA WITH MESH;  Surgeon: Axel Filler, MD;  Location: [facility] OR;  Service: General;  Laterality: Left;   INSERTION OF MESH Right 11/10/2017   Procedure: INSERTION OF MESH;  Surgeon: Axel Filler, MD;  Location: [facility] OR;  Service: General;  Laterality: Right;    Family History  Problem Relation Age of Onset   Coronary artery disease Father    Stroke Son    Kidney disease Neg Hx    Prostate cancer Neg Hx    Bladder Cancer Neg Hx    Social History   Socioeconomic History   Marital status: Married    Spouse name: Radiographer, therapeutic   Number of children: 3   Years of education: Not on file   Highest education level: 12th grade  Occupational History   Occupation: retired    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    They have two children that live in [place] and one son that lives in [place].   He likes to work in his garden    Social Determinants of Corporate investment banker Strain: Low Risk  (10/17/2022)   Overall Financial Resource Strain (CARDIA)    Difficulty of Paying Living Expenses: Not hard at all  Food Insecurity: No Food Insecurity (10/17/2022)   Hunger Vital Sign    Worried About Running Out of Food in the Last Year: Never true    Ran Out of Food in the Last Year: Never true  Transportation Needs: No Transportation Needs (10/17/2022)   PRAPARE - Administrator, Civil Service (Medical): No    Lack of Transportation (Non-Medical): No  Physical Activity: Insufficiently Active (10/17/2022)   Exercise Vital Sign    Days of Exercise per Week: 5 days    Minutes of Exercise per Session: 20 min  Stress: No Stress Concern Present (10/17/2022)   [employer] of Occupational Health - Occupational Stress Questionnaire    Feeling of Stress : Not at all  Social Connections: Moderately Isolated (10/17/2022)   Social Connection and  Isolation Panel [NHANES]    Frequency of Communication with Friends and Family: More than three times a week    Frequency of Social Gatherings with Friends and Family: More than three times a week    Attends Religious Services: Never    Database administrator or Organizations: No    Attends Engineer, structural: Never    Marital Status: Married    Tobacco Counseling Counseling given: Not Answered   Clinical Intake:  Pre-visit preparation completed: Yes  Pain : No/denies pain     BMI - recorded: 26.64 Nutritional Status: BMI 25 -29 Overweight Nutritional Risks: None Diabetes:  No  How often do you need to have someone help you when you read instructions, pamphlets, or other written materials from your doctor or pharmacy?: 1 - Never  Interpreter Needed?: No  Comments: lives with wife Information entered by :: B.Saylah Ketner,LPN   Activities of Daily Living    10/17/2022   11:45 AM 06/19/2022    9:45 AM  In your present state of health, do you have any difficulty performing the following activities:  Hearing? 1 0  Vision? 0 0  Difficulty concentrating or making decisions? 0 0  Walking or climbing stairs? 0 1  Dressing or bathing? 0 0  Doing errands, shopping? 0 0  Preparing Food and eating ? N   Using the Toilet? N   In the past six months, have you accidently leaked urine? N   Do you have problems with loss of bowel control? N   Managing your Medications? N   Managing your Finances? N   Housekeeping or managing your Housekeeping? N     Patient Care Team: Alba Cory, MD as PCP - General (Family Medicine) Lanier Prude, MD as PCP - Electrophysiology (Cardiology) Alwyn Pea, MD as Consulting Physician (Cardiology) Lonell Face, MD as Consulting Physician (Neurology) Crist Fat, MD as Attending Physician (Urology)  Indicate any recent Medical Services you may have received from other than [facility] providers in the past year (date may be  approximate).     Assessment:   This is a routine wellness examination for Koppel.  Hearing/Vision screen Hearing Screening - Comments:: Adequate hearing:little loss Vision Screening - Comments:: Adequate vision; has glasses but does not wear only reading sometimes  Dietary issues and exercise activities discussed:     Goals Addressed             This Visit's Progress    Increase physical activity   On track    Increase physical activity to 150 minutes per week       Depression Screen    10/17/2022   11:42 AM 06/19/2022    9:45 AM 03/13/2022    9:32 AM 02/18/2022    9:46 AM 10/19/2021   10:29 AM 09/19/2021    2:57 PM 08/01/2021   10:30 AM  PHQ 2/9 Scores  PHQ - 2 Score 0 0 0 0 0 0 0  PHQ- 9 Score  0 0 0 0 0 0    Fall Risk    10/17/2022   11:38 AM 06/19/2022    9:45 AM 03/13/2022    9:32 AM 02/18/2022    9:46 AM 10/19/2021   10:29 AM  Fall Risk   Falls in the past year? 0 0 0 0 1  Number falls in past yr: 0 0 0 0 1  Injury with Fall? 0 0 0 0 0  Risk for fall due to : No Fall Risks No Fall Risks  Impaired balance/gait Impaired balance/gait  Follow up Education provided;Falls prevention discussed Falls prevention discussed  Falls prevention discussed Falls prevention discussed    MEDICARE RISK AT HOME:  Medicare Risk at Home - 10/17/22 1139     Any stairs in or around the home? Yes    If so, are there any without handrails? Yes    Home free of loose throw rugs in walkways, pet beds, electrical cords, etc? Yes    Adequate lighting in your home to reduce risk of falls? Yes    Life alert? No    Use of a cane, walker  or w/c? Yes   cane   Grab bars in the bathroom? Yes    Shower chair or bench in shower? Yes    Elevated toilet seat or a handicapped toilet? Yes             TIMED UP AND GO:  Was the test performed?  No    Cognitive Function:        10/17/2022   11:46 AM 03/13/2022    9:47 AM 02/29/2020    9:49 AM 02/23/2019   10:34 AM 02/20/2018    8:53 AM   6CIT Screen  What Year? 0 points 0 points 0 points 0 points 0 points  What month? 0 points 0 points 0 points 0 points 0 points  What time? 0 points 0 points 0 points 0 points 0 points  Count back from 20 0 points 0 points 0 points 0 points 0 points  Months in reverse 0 points 2 points 0 points 0 points 0 points  Repeat phrase 0 points 0 points 0 points 6 points 0 points  Total Score 0 points 2 points 0 points 6 points 0 points    Immunizations Immunization History  Administered Date(s) Administered   Fluad Quad(high Dose 65+) 12/07/2018, 12/01/2019, 01/08/2021, 12/12/2021   Influenza Split 03/25/2008, 12/19/2008, 11/29/2009   Influenza, High Dose Seasonal PF 04/17/2015, 12/29/2015, 12/09/2016, 12/31/2017   Influenza, Seasonal, Injecte, Preservative Fre 12/18/2010, 12/06/2011   Moderna Covid-19 Vaccine Bivalent Booster 30yrs & up 12/18/2020   Moderna Sars-Covid-2 Vaccination 05/14/2019, 06/15/2019, 03/06/2020, 07/24/2020, 02/06/2022   Pneumococcal Conjugate-13 05/25/2013   Pneumococcal Polysaccharide-23 11/29/2009   Tdap 03/28/2010, 08/01/2021   Zoster, Live 11/09/2010    TDAP status: Up to date  Flu Vaccine status: Up to date  Pneumococcal vaccine status: Up to date  Covid-19 vaccine status: Completed vaccines  Qualifies for Shingles Vaccine? Yes   Zostavax completed No   Shingrix Completed?: No.    Education has been provided regarding the importance of this vaccine. Patient has been advised to call insurance company to determine out of pocket expense if they have not yet received this vaccine. Advised may also receive vaccine at local pharmacy or Health Dept. Verbalized acceptance and understanding.  Screening Tests Health Maintenance  Topic Date Due   Zoster Vaccines- Shingrix (1 of 2) 11/09/1987   COVID-19 Vaccine (7 - 2023-24 season) 04/03/2022   INFLUENZA VACCINE  10/17/2022   Medicare Annual Wellness (AWV)  10/17/2023   DTaP/Tdap/Td (3 - Td or Tdap) 08/02/2031    Pneumonia Vaccine 52+ Years old  Completed   HPV VACCINES  Aged Out    Health Maintenance  Health Maintenance Due  Topic Date Due   Zoster Vaccines- Shingrix (1 of 2) 11/09/1987   COVID-19 Vaccine (7 - 2023-24 season) 04/03/2022   INFLUENZA VACCINE  10/17/2022    Colorectal cancer screening: No longer required.   Lung Cancer Screening: (Low Dose CT Chest recommended if Age 53-80 years, 20 pack-year currently smoking OR have quit w/in 15years.) does not qualify.   Lung Cancer Screening Referral: no  Additional Screening:  Hepatitis C Screening: does not qualify; Completed yes  Vision Screening: Recommended annual ophthalmology exams for early detection of glaucoma and other disorders of the eye. Is the patient up to date with their annual eye exam?  Yes  Who is the provider or what is the name of the office in which the patient attends annual eye exams? [facility] If pt is not established with a provider, would they  like to be referred to a provider to establish care? No .   Dental Screening: Recommended annual dental exams for proper oral hygiene  Diabetic Foot Exam: n/a  Community Resource Referral / Chronic Care Management: CRR required this visit?  No   CCM required this visit?  No     Plan:     I have personally reviewed and noted the following in the patient's chart:   Medical and social history Use of alcohol, tobacco or illicit drugs  Current medications and supplements including opioid prescriptions. Patient is not currently taking opioid prescriptions. Functional ability and status Nutritional status Physical activity Advanced directives List of other physicians Hospitalizations, surgeries, and ER visits in previous 12 months Vitals Screenings to include cognitive, depression, and falls Referrals and appointments  In addition, I have reviewed and discussed with patient certain preventive protocols, quality metrics, and best practice recommendations.  A written personalized care plan for preventive services as well as general preventive health recommendations were provided to patient.     Sue Lush, LPN   03/23/1094   After Visit Summary: (MyChart) Due to this being a telephonic visit, the after visit summary with patients personalized plan was offered to patient via MyChart   Nurse Notes: The patient states he is doing well and has no concerns or questions at this time.

## 2022-10-22 NOTE — Progress Notes (Unsigned)
Name: Ronald Fleming   MRN: [id number]    DOB: [birth year]   Date:10/23/2022       Progress Note  Subjective  Chief Complaint  Follow up  HPI  Bradycardia: patient has home health and noticed his heart rate was low, he followed up with Dr. Juliann Pares  and heart rate on arrival was 38 but on EKG - per wife - heart rate was in the 70's. Patient has been asymptomatic seen by Dr. Juliann Pares, heart rate today is low but no symptoms   OA both knees with gait instability: he is doing better,  completed  PT , he now is using a cane to help with balance. He takes Tylenol prn Pain level today is zero at rest, but has pain when he first gets up , pain can go up to 4/10  HTN: No chest pain he has occasional palpation. He denies dizziness, he is on lower dose of Losartan 25 mg and bp has been controlled.  BP at home dropped one day but no symptoms, we will continue current dose and reminded him to stay hydrated   Cardiomyopathy: under the care of Dr. Frederich Balding lower dose of losartan - currently at 25 mg daily - also on lasix due to cardiomyopathy with EF down to 40 %. He states SOB a little worse with activity,  denies chest pain, he denies orthopnea. He has an appointment coming up with Dr. Juliann Pares   INTERPRETATION 04/16/2021 MILD LV SYSTOLIC DYSFUNCTION (See above)   WITH MODERATE LVH  NORMAL RIGHT VENTRICULAR SYSTOLIC FUNCTION  MODERATE VALVULAR REGURGITATION (See above)  NO VALVULAR STENOSIS  IRREGULAR HEART RHYTHM CAPTURED THROUGHOUT EXAM  ESTIMATED LVEF 40%  Aortic: MODERATE AI  Mitral: MODERATE - SEVERE MR; MAX VEL. 4.36m/s  Tricuspid: MODERATE TR (3.15m/s)  Pulmonic: MILD PI  MODERATE LAE  MILD RAE  MILDLY DILATED ASCENDING AORTA MEASURING 3.8cm  MILDLY DILATED AORTIC ROOT MEASURING 3.8cm   CKI stage III: stable, urine micro negative, denies pruritis, on ARB advised to stay off NSAID's. We will recheck labs today   Hyperlipidemia: taking statins, denies side effects, last LDL went up from 66  to 84, he states compliant with medication, we will recheck labs today. He takes Pradaxa instead of aspirin   Alzheimer's late onset: wife noticed he had memory loss, seeing Dr Sherryll Burger ( Neurologist ) he used to take Razadyne 12mg  twice daily e has been on medication since Fall 2017, he chose to stop medication years ago and is stable. . Since wife had brain surgery 2021 , both sons are now living in town, and [place] lives next door and checks on them daily, cooking and helping around the house He is still able to perform ADL on his own, still pays his bills , he also still drives and has not gotten lost - he never leaves his house by himself  Pre-diabetes:  he denies polyphagia, polydipsia, he has BPH and takes lasix daily and that also increases urinary frequency  Unchanged    Afib: he states doing well, occasional palpitation and SOB with activity but no chest pain,  he is on Pradaxa and denies easy bleeding or bruising. Sees Dr Juliann Pares, he has been off aspirin because of risk of bleeding but is on pradaxa    BPH: he has urinary frequency during the day,  he also has nocturia two to three times daily, but able to fall back asleep, he is on Flomax and sees Dr. Lonna Cobb. He has a history of  hematuria secondary to kidney stones but no recent episodes.    Pancreas cyst: found on CT for stone search, had repeat MRI 10/2018 and repeat was done 10/2019 . He has good appetite, no nausea, vomiting or abdominal pain. He continues to be symptoms free, and not willing to have more scans or follow ups for that Unchanged   IMPRESSION: 2.4 cm unilocular cystic lesion in the pancreatic body, showing progressive increase in size from 1.3 cm on 2019 exam. EUS/FNA should be considered for further evaluation. This recommendation follows ACR consensus guidelines: Management of Incidental Pancreatic Cysts: A White Paper of the ACR Incidental Findings Committee. J Am Coll Radiol 2017;14:911-923.   Chronic DVT right  leg, also has varicose veins, no pain or discomfort at this time, he has intermittent right lower extremity edema that resolves with compression stocking hoses . He takes Pradaxa   Gout: no recent episodes, but last uric acid was not at goal , he is now taking allopurinol bid and we will recheck labs  today   Patient Active Problem List   Diagnosis Date Noted   Gait instability 08/01/2021   Primary osteoarthritis of both knees 08/01/2021   DDD (degenerative disc disease), lumbosacral 09/11/2017   Nephrolithiasis 09/11/2017   Vitamin D deficiency 09/11/2017   Lesion of pancreas 07/09/2017   Atherosclerosis of aorta (HCC) 07/01/2017   Osteopenia 05/21/2017   Atrioventricular block, second degree 05/29/2016   Vitamin B12 deficiency 05/21/2016   Late onset Alzheimer's disease without behavioral disturbance (HCC) 05/21/2016   Vitiligo 02/12/2016   BPH (benign prostatic hyperplasia) 08/03/2015   Atrial fibrillation (HCC) 02/01/2015   Neuropathy 08/23/2014   Benign essential HTN 08/27/2006   Hypercholesterolemia without hypertriglyceridemia 08/27/2006    Past Surgical History:  Procedure Laterality Date   COLONOSCOPY     HERNIA REPAIR  11/09/2017   INGUINAL HERNIA REPAIR Right 11/10/2017   Procedure: LAPAROSCOPIC RIGHT  INGUINAL HERNIA REPAIR;  Surgeon: Axel Filler, MD;  Location: [facility] OR;  Service: General;  Laterality: Right;   INGUINAL HERNIA REPAIR Left 02/23/2020   Procedure: LAPAROSCOPIC LEFT  INGUINAL HERNIA WITH MESH;  Surgeon: Axel Filler, MD;  Location: [facility] OR;  Service: General;  Laterality: Left;   INSERTION OF MESH Right 11/10/2017   Procedure: INSERTION OF MESH;  Surgeon: Axel Filler, MD;  Location: [facility] OR;  Service: General;  Laterality: Right;    Family History  Problem Relation Age of Onset   Coronary artery disease Father    Stroke Son    Kidney disease Neg Hx    Prostate cancer Neg Hx    Bladder Cancer Neg Hx     Social History   Tobacco Use    Smoking status: Never   Smokeless tobacco: Never  Substance Use Topics   Alcohol use: Never    Alcohol/week: 0.0 standard drinks of alcohol     Current Outpatient Medications:    allopurinol (ZYLOPRIM) 100 MG tablet, TAKE 1 TABLET TWICE DAILY, Disp: 180 tablet, Rfl: 3   atorvastatin (LIPITOR) 20 MG tablet, TAKE 1 TABLET EVERY DAY, Disp: 90 tablet, Rfl: 3   Cholecalciferol (VITAMIN D) 50 MCG (2000 UT) CAPS, Take 1 capsule (2,000 Units total) by mouth daily., Disp: 100 capsule, Rfl: 3   dabigatran (PRADAXA) 150 MG CAPS capsule, Take 1 capsule (150 mg total) by mouth 2 (two) times daily., Disp: 60 capsule, Rfl: 0   furosemide (LASIX) 20 MG tablet, Take 1 tablet by mouth in the morning., Disp: , Rfl:    losartan (  COZAAR) 25 MG tablet, Take 1 tablet (25 mg total) by mouth daily., Disp: 30 tablet, Rfl: 0   tamsulosin (FLOMAX) 0.4 MG CAPS capsule, TAKE 1 CAPSULE EVERY DAY, Disp: 90 capsule, Rfl: 3   vitamin B-12 (CYANOCOBALAMIN) 1000 MCG tablet, Take 2,000 mcg by mouth 3 (three) times a week. , Disp: , Rfl:   No Known Allergies  I personally reviewed active problem list, medication list, allergies, family history, social history with the patient/caregiver today.   ROS  Constitutional: Negative for fever or weight change.  Respiratory: Negative for cough and shortness of breath.   Cardiovascular: Negative for chest pain or palpitations.  Gastrointestinal: Negative for abdominal pain, no bowel changes.  Musculoskeletal: Negative for gait problem or joint swelling.  Skin: Negative for rash.  Neurological: Negative for dizziness or headache.  No other specific complaints in a complete review of systems (except as listed in HPI above).   Objective  Vitals:   10/23/22 1036  BP: 122/68  Pulse: (!) 52  Resp: 14  Temp: 97.7 F (36.5 C)  TempSrc: Oral  SpO2: 95%  Weight: 190 lb 14.4 oz (86.6 kg)  Height: 5\' 8"  (1.727 m)    Body mass index is 29.03 kg/m.  Physical  Exam  Constitutional: Patient appears well-developed and well-nourished.  No distress.  HEENT: head atraumatic, normocephalic, pupils equal and reactive to light, neck supple Cardiovascular: Normal rate, regular rhythm and normal heart sounds.  No murmur heard. No BLE edema. Pulmonary/Chest: Effort normal and breath sounds normal. No respiratory distress. Abdominal: Soft.  There is no tenderness. Psychiatric: Patient has a normal mood and affect. behavior is normal. Judgment and thought content normal.   PHQ2/9:    10/23/2022   10:37 AM 10/17/2022   11:42 AM 06/19/2022    9:45 AM 03/13/2022    9:32 AM 02/18/2022    9:46 AM  Depression screen PHQ 2/9  Decreased Interest 0 0 0 0 0  Down, Depressed, Hopeless 0 0 0 0 0  PHQ - 2 Score 0 0 0 0 0  Altered sleeping 0  0 0 0  Tired, decreased energy 0  0 0 0  Change in appetite 0  0 0 0  Feeling bad or failure about yourself  0  0 0 0  Trouble concentrating 0  0 0 0  Moving slowly or fidgety/restless 0  0 0 0  Suicidal thoughts 0  0 0 0  PHQ-9 Score 0  0 0 0  Difficult doing work/chores    Not difficult at all     phq 9 is negative   Fall Risk:    10/23/2022   10:37 AM 10/17/2022   11:38 AM 06/19/2022    9:45 AM 03/13/2022    9:32 AM 02/18/2022    9:46 AM  Fall Risk   Falls in the past year? 0 0 0 0 0  Number falls in past yr:  0 0 0 0  Injury with Fall?  0 0 0 0  Risk for fall due to : No Fall Risks No Fall Risks No Fall Risks  Impaired balance/gait  Follow up Falls prevention discussed Education provided;Falls prevention discussed Falls prevention discussed  Falls prevention discussed     Functional Status Survey: Is the patient deaf or have difficulty hearing?: Yes Does the patient have difficulty seeing, even when wearing glasses/contacts?: No Does the patient have difficulty concentrating, remembering, or making decisions?: No Does the patient have difficulty walking or climbing stairs?: Yes Does  the patient have difficulty  dressing or bathing?: No Does the patient have difficulty doing errands alone such as visiting a doctor's office or shopping?: No    Assessment & Plan  1. Dilated cardiomyopathy (HCC)  - losartan (COZAAR) 25 MG tablet; Take 1 tablet (25 mg total) by mouth daily.  Dispense: 90 tablet; Refill: 1  2. Stage 3a chronic kidney disease (HCC)  - CBC with Differential/Platelet - COMPLETE METABOLIC PANEL WITH GFR - losartan (COZAAR) 25 MG tablet; Take 1 tablet (25 mg total) by mouth daily.  Dispense: 90 tablet; Refill: 1  3. Paroxysmal atrial fibrillation (HCC)  - dabigatran (PRADAXA) 150 MG CAPS capsule; Take 1 capsule (150 mg total) by mouth 2 (two) times daily.  Dispense: 180 capsule; Refill: 1  4. Atherosclerosis of aorta (HCC)  - Lipid panel  5. Thrombocytopenia (HCC)  Recheck CBC  6. Controlled gout  - Uric acid  7. Chronic deep vein thrombosis (DVT) of right femoral vein (HCC)  - dabigatran (PRADAXA) 150 MG CAPS capsule; Take 1 capsule (150 mg total) by mouth 2 (two) times daily.  Dispense: 180 capsule; Refill: 1  8. Vitamin B12 deficiency  - B12 and Folate Panel  9. Vitamin D deficiency  - VITAMIN D 25 Hydroxy (Vit-D Deficiency, Fractures)  10. Benign essential HTN   BP normal, no dizziness, continue Losartan

## 2022-10-23 ENCOUNTER — Encounter: Payer: Self-pay | Admitting: Family Medicine

## 2022-10-23 ENCOUNTER — Ambulatory Visit: Payer: Medicare PPO | Admitting: Family Medicine

## 2022-10-23 VITALS — BP 122/68 | HR 52 | Temp 97.7°F | Resp 14 | Ht 68.0 in | Wt 190.9 lb

## 2022-10-23 DIAGNOSIS — D696 Thrombocytopenia, unspecified: Secondary | ICD-10-CM

## 2022-10-23 DIAGNOSIS — I420 Dilated cardiomyopathy: Secondary | ICD-10-CM

## 2022-10-23 DIAGNOSIS — N1831 Chronic kidney disease, stage 3a: Secondary | ICD-10-CM | POA: Diagnosis not present

## 2022-10-23 DIAGNOSIS — M109 Gout, unspecified: Secondary | ICD-10-CM

## 2022-10-23 DIAGNOSIS — E559 Vitamin D deficiency, unspecified: Secondary | ICD-10-CM | POA: Diagnosis not present

## 2022-10-23 DIAGNOSIS — E538 Deficiency of other specified B group vitamins: Secondary | ICD-10-CM

## 2022-10-23 DIAGNOSIS — I480 Paroxysmal atrial fibrillation: Secondary | ICD-10-CM | POA: Diagnosis not present

## 2022-10-23 DIAGNOSIS — I82511 Chronic embolism and thrombosis of right femoral vein: Secondary | ICD-10-CM | POA: Diagnosis not present

## 2022-10-23 DIAGNOSIS — I10 Essential (primary) hypertension: Secondary | ICD-10-CM

## 2022-10-23 DIAGNOSIS — I700 Atherosclerosis of aorta: Secondary | ICD-10-CM | POA: Diagnosis not present

## 2022-10-23 LAB — CBC WITH DIFFERENTIAL/PLATELET
Absolute Monocytes: 515 cells/uL (ref 200–950)
Basophils Absolute: 19 cells/uL (ref 0–200)
Basophils Relative: 0.3 %
Eosinophils Absolute: 81 cells/uL (ref 15–500)
Eosinophils Relative: 1.3 %
HCT: 46.3 % (ref 38.5–50.0)
Hemoglobin: 15 g/dL (ref 13.2–17.1)
Lymphs Abs: 1147 cells/uL (ref 850–3900)
MCH: 31.8 pg (ref 27.0–33.0)
MCHC: 32.4 g/dL (ref 32.0–36.0)
MCV: 98.3 fL (ref 80.0–100.0)
MPV: 12 fL (ref 7.5–12.5)
Monocytes Relative: 8.3 %
Neutro Abs: 4439 cells/uL (ref 1500–7800)
Neutrophils Relative %: 71.6 %
Platelets: 147 10*3/uL (ref 140–400)
RBC: 4.71 10*6/uL (ref 4.20–5.80)
RDW: 11.7 % (ref 11.0–15.0)
Total Lymphocyte: 18.5 %
WBC: 6.2 10*3/uL (ref 3.8–10.8)

## 2022-10-23 LAB — COMPLETE METABOLIC PANEL WITH GFR
AG Ratio: 1.4 (calc) (ref 1.0–2.5)
ALT: 13 U/L (ref 9–46)
AST: 17 U/L (ref 10–35)
Albumin: 4.2 g/dL (ref 3.6–5.1)
Alkaline phosphatase (APISO): 78 U/L (ref 35–144)
BUN/Creatinine Ratio: 16 (calc) (ref 6–22)
BUN: 22 mg/dL (ref 7–25)
CO2: 24 mmol/L (ref 20–32)
Calcium: 9.1 mg/dL (ref 8.6–10.3)
Chloride: 108 mmol/L (ref 98–110)
Creat: 1.4 mg/dL — ABNORMAL HIGH (ref 0.70–1.22)
Globulin: 3.1 g/dL (calc) (ref 1.9–3.7)
Glucose, Bld: 90 mg/dL (ref 65–99)
Potassium: 3.9 mmol/L (ref 3.5–5.3)
Sodium: 142 mmol/L (ref 135–146)
Total Bilirubin: 1 mg/dL (ref 0.2–1.2)
Total Protein: 7.3 g/dL (ref 6.1–8.1)
eGFR: 50 mL/min/{1.73_m2} — ABNORMAL LOW (ref >=60)

## 2022-10-23 LAB — LIPID PANEL
Cholesterol: 140 mg/dL (ref <200)
HDL: 56 mg/dL (ref >=40)
LDL Cholesterol (Calc): 68 mg/dL (calc)
Non-HDL Cholesterol (Calc): 84 mg/dL (calc) (ref <130)
Total CHOL/HDL Ratio: 2.5 (calc) (ref <5.0)
Triglycerides: 80 mg/dL (ref <150)

## 2022-10-23 LAB — URIC ACID: Uric Acid, Serum: 6.8 mg/dL (ref 4.0–8.0)

## 2022-10-23 MED ORDER — LOSARTAN POTASSIUM 25 MG PO TABS: 25.0000 mg | ORAL_TABLET | Freq: Every day | ORAL | 1 refills | Status: DC

## 2022-10-23 MED ORDER — DABIGATRAN ETEXILATE MESYLATE 150 MG PO CAPS: 150.0000 mg | ORAL_CAPSULE | Freq: Two times a day (BID) | ORAL | 1 refills | Status: DC

## 2022-11-07 DIAGNOSIS — R001 Bradycardia, unspecified: Secondary | ICD-10-CM | POA: Diagnosis not present

## 2022-11-07 DIAGNOSIS — I420 Dilated cardiomyopathy: Secondary | ICD-10-CM | POA: Diagnosis not present

## 2022-11-07 DIAGNOSIS — I480 Paroxysmal atrial fibrillation: Secondary | ICD-10-CM | POA: Diagnosis not present

## 2022-11-07 DIAGNOSIS — N1831 Chronic kidney disease, stage 3a: Secondary | ICD-10-CM | POA: Diagnosis not present

## 2022-11-07 DIAGNOSIS — I10 Essential (primary) hypertension: Secondary | ICD-10-CM | POA: Diagnosis not present

## 2022-11-07 DIAGNOSIS — E7800 Pure hypercholesterolemia, unspecified: Secondary | ICD-10-CM | POA: Diagnosis not present

## 2022-11-07 DIAGNOSIS — R0602 Shortness of breath: Secondary | ICD-10-CM | POA: Diagnosis not present

## 2022-11-07 DIAGNOSIS — G4733 Obstructive sleep apnea (adult) (pediatric): Secondary | ICD-10-CM | POA: Diagnosis not present

## 2022-12-25 ENCOUNTER — Ambulatory Visit (INDEPENDENT_AMBULATORY_CARE_PROVIDER_SITE_OTHER): Payer: Medicare PPO

## 2022-12-25 DIAGNOSIS — Z23 Encounter for immunization: Secondary | ICD-10-CM | POA: Diagnosis not present

## 2023-02-11 NOTE — Progress Notes (Signed)
Name: Ronald Fleming   MRN: [id number]    DOB: [birth year]   Date:02/12/2023       Progress Note  Subjective  Chief Complaint  Chief Complaint  Patient presents with   Follow-up    HPI  Discussed the use of AI scribe software for clinical note transcription with the patient, who gave verbal consent to proceed.  History of Present Illness   The patient, with a history of bradycardia, cardiomyopathy, chronic kidney disease, gout, hyperlipidemia, atrial fibrillation, and Alzheimer's disease, presents with concerns of weight loss. The patient has lost approximately six pounds since the last visit in August. The patient attributes this weight loss to stress, but denies any changes in appetite or any gastrointestinal symptoms such as nausea or abdominal pain.  The patient's bradycardia has been stable with no reported symptoms of palpitations, shortness of breath, or chest pain. The patient's blood pressure has been well-controlled on a low dose of Losartan. The patient has not been monitoring his blood pressure at home recently.  The patient's cardiomyopathy is managed with Losartan and Lasix, which the patient takes daily. The patient denies any symptoms of heart failure such as nocturnal dyspnea or leg swelling. The patient reports that he has to exert himself significantly to experience shortness of breath.  The patient's chronic kidney disease has been stable with a GFR of 50. The patient is taking Losartan for kidney protection and avoids NSAIDs. The patient denies any symptoms suggestive of worsening kidney function such as itching.  The patient's hyperlipidemia is well-controlled with Atorvastatin, with a last measured cholesterol of 68. The patient also takes Pradaxa for atrial fibrillation and denies any symptoms of palpitations or abnormal sensations in the chest. The patient denies any excessive bleeding or bruising.  The patient has a history of a pancreatic cyst discovered  incidentally on a CT scan performed for kidney stones. The patient denies any abdominal pain or nausea related to this cyst.  The patient's gout is well-controlled on Allopurinol with a last uric acid level of 6.8. The patient denies any recent gout flares.  The patient has a history of a chronic DVT in the right leg but denies any current leg pain or discomfort. The patient is not currently wearing compression stockings.  The patient also has a history of Alzheimer's disease but remains independent in activities of daily living. The patient is able to manage his own medications and can perform household tasks. The patient is still able to drive. The patient has had some difficulty with managing finances, including forgetting to pay bills and making errors when writing checks.  Lastly, the patient has a history of prostate issues and is currently taking Flomax for urinary frequency. The patient reports needing to get up at night to urinate but denies any dysuria or hematuria.         Patient Active Problem List   Diagnosis Date Noted   Gait instability 08/01/2021   Primary osteoarthritis of both knees 08/01/2021   DDD (degenerative disc disease), lumbosacral 09/11/2017   Nephrolithiasis 09/11/2017   Vitamin D deficiency 09/11/2017   Lesion of pancreas 07/09/2017   Atherosclerosis of aorta (HCC) 07/01/2017   Osteopenia 05/21/2017   Atrioventricular block, second degree 05/29/2016   Vitamin B12 deficiency 05/21/2016   Late onset Alzheimer's disease without behavioral disturbance (HCC) 05/21/2016   Vitiligo 02/12/2016   BPH (benign prostatic hyperplasia) 08/03/2015   Atrial fibrillation (HCC) 02/01/2015   Neuropathy 08/23/2014   Benign essential HTN 08/27/2006  Hypercholesterolemia without hypertriglyceridemia 08/27/2006    Past Surgical History:  Procedure Laterality Date   COLONOSCOPY     HERNIA REPAIR  11/09/2017   INGUINAL HERNIA REPAIR Right 11/10/2017   Procedure: LAPAROSCOPIC  RIGHT  INGUINAL HERNIA REPAIR;  Surgeon: Axel Filler, MD;  Location: [facility] OR;  Service: General;  Laterality: Right;   INGUINAL HERNIA REPAIR Left 02/23/2020   Procedure: LAPAROSCOPIC LEFT  INGUINAL HERNIA WITH MESH;  Surgeon: Axel Filler, MD;  Location: [facility] OR;  Service: General;  Laterality: Left;   INSERTION OF MESH Right 11/10/2017   Procedure: INSERTION OF MESH;  Surgeon: Axel Filler, MD;  Location: [facility] OR;  Service: General;  Laterality: Right;    Family History  Problem Relation Age of Onset   Coronary artery disease Father    Stroke Son    Kidney disease Neg Hx    Prostate cancer Neg Hx    Bladder Cancer Neg Hx     Social History   Tobacco Use   Smoking status: Never   Smokeless tobacco: Never  Substance Use Topics   Alcohol use: Never    Alcohol/week: 0.0 standard drinks of alcohol     Current Outpatient Medications:    allopurinol (ZYLOPRIM) 100 MG tablet, TAKE 1 TABLET TWICE DAILY, Disp: 180 tablet, Rfl: 3   atorvastatin (LIPITOR) 20 MG tablet, TAKE 1 TABLET EVERY DAY, Disp: 90 tablet, Rfl: 3   Cholecalciferol (VITAMIN D) 50 MCG (2000 UT) CAPS, Take 1 capsule (2,000 Units total) by mouth daily., Disp: 100 capsule, Rfl: 3   dabigatran (PRADAXA) 150 MG CAPS capsule, Take 1 capsule (150 mg total) by mouth 2 (two) times daily., Disp: 180 capsule, Rfl: 1   furosemide (LASIX) 20 MG tablet, Take 1 tablet by mouth in the morning., Disp: , Rfl:    losartan (COZAAR) 25 MG tablet, Take 1 tablet (25 mg total) by mouth daily., Disp: 90 tablet, Rfl: 1   tamsulosin (FLOMAX) 0.4 MG CAPS capsule, TAKE 1 CAPSULE EVERY DAY, Disp: 90 capsule, Rfl: 3   vitamin B-12 (CYANOCOBALAMIN) 1000 MCG tablet, Take 2,000 mcg by mouth 3 (three) times a week. , Disp: , Rfl:   No Known Allergies  I personally reviewed active problem list, medication list, allergies, family history, social history with the patient/caregiver today.   ROS  Ten systems reviewed and is negative except as  mentioned in HPI    Objective  Vitals:   02/12/23 0935  BP: 122/82  Pulse: 76  Resp: 16  SpO2: 98%  Weight: 184 lb (83.5 kg)  Height: 5\' 9"  (1.753 m)    Body mass index is 27.17 kg/m.  Physical Exam  Constitutional: Patient appears well-developed and well-nourished.  No distress.  HEENT: head atraumatic, normocephalic, pupils equal and reactive to light, neck supple Cardiovascular: Normal rate, regular rhythm and normal heart sounds.  No murmur heard. No BLE edema.no calf tenderness during palpation  Pulmonary/Chest: Effort normal and breath sounds normal. No respiratory distress. Abdominal: Soft.  There is no tenderness. Muscular skeletal: he uses a cane Psychiatric: Patient has a normal mood and affect. behavior is normal. Judgment and thought content normal.    PHQ2/9:    02/12/2023    9:34 AM 10/23/2022   10:37 AM 10/17/2022   11:42 AM 06/19/2022    9:45 AM 03/13/2022    9:32 AM  Depression screen PHQ 2/9  Decreased Interest 0 0 0 0 0  Down, Depressed, Hopeless 0 0 0 0 0  PHQ - 2 Score 0  0 0 0 0  Altered sleeping 0 0  0 0  Tired, decreased energy 0 0  0 0  Change in appetite 0 0  0 0  Feeling bad or failure about yourself  0 0  0 0  Trouble concentrating 0 0  0 0  Moving slowly or fidgety/restless 0 0  0 0  Suicidal thoughts 0 0  0 0  PHQ-9 Score 0 0  0 0  Difficult doing work/chores     Not difficult at all    phq 9 is negative   Fall Risk:    02/12/2023    9:34 AM 10/23/2022   10:37 AM 10/17/2022   11:38 AM 06/19/2022    9:45 AM 03/13/2022    9:32 AM  Fall Risk   Falls in the past year? 0 0 0 0 0  Number falls in past yr: 0  0 0 0  Injury with Fall? 0  0 0 0  Risk for fall due to : No Fall Risks No Fall Risks No Fall Risks No Fall Risks   Follow up Falls prevention discussed Falls prevention discussed Education provided;Falls prevention discussed Falls prevention discussed       Functional Status Survey: Is the patient deaf or have difficulty  hearing?: No Does the patient have difficulty seeing, even when wearing glasses/contacts?: No Does the patient have difficulty concentrating, remembering, or making decisions?: No Does the patient have difficulty walking or climbing stairs?: No Does the patient have difficulty dressing or bathing?: No Does the patient have difficulty doing errands alone such as visiting a doctor's office or shopping?: No    Assessment & Plan  Assessment and Plan    Unintentional Weight Loss Noted weight loss from 190 lbs to 184 lbs since August. Possible stress-related ( wife had a stroke recently )and poor diet. No symptoms of decreased appetite or abdominal pain. -Referral to a dietician for nutritional counseling. -Continue monitoring weight.  Bradycardia History of bradycardia with a heart rate as low as 38 bpm. Currently asymptomatic with a normal heart rate. Cardiologist aware of condition. -No change in management.  Hypertension On low dose Losartan 25mg . Blood pressure well controlled at current visit. Patient not regularly checking blood pressure at home. -Encourage regular home blood pressure monitoring.  Cardiomyopathy Managed by cardiologist. On Losartan and Lasix. No symptoms of heart failure such as shortness of breath or leg swelling. -Continue current management.  Chronic Kidney Disease Stable with a GFR of 50. On Losartan for kidney protection. No symptoms of kidney dysfunction. -Continue current management and avoid NSAIDs.  Hyperlipidemia Well controlled with Atorvastatin 20mg . Last LDL was 68. -Continue Atorvastatin 20mg .  Atrial Fibrillation On Pradaxa. No symptoms of palpitations or abnormal sensations in the chest. No excessive bleeding or bruising. -Continue Pradaxa.  Pancreatic Cyst Asymptomatic. No abdominal pain or nausea. -No change in management.  Gout On Allopurinol. Uric acid level controlled at 6.8. No current gout symptoms. -Continue  Allopurinol.  Chronic DVT No current pain or discomfort in right leg. Not wearing compression stockings. -No change in management.  Osteoarthritis Pain in left knee rated as 1/10. Pain increases with activity. -No change in management.  Alzheimer's Disease Mild symptoms. Independent with activities of daily living but has made some mistakes with check writing and bill paying. -Continue monitoring.  Prostate Issues On Tamsulosin for urinary frequency. No blood in urine or burning sensation. -Continue Tamsulosin.  Follow-up in 4 months.

## 2023-02-12 ENCOUNTER — Ambulatory Visit: Payer: Medicare PPO | Admitting: Family Medicine

## 2023-02-12 ENCOUNTER — Encounter: Payer: Self-pay | Admitting: Family Medicine

## 2023-02-12 VITALS — BP 122/82 | HR 76 | Resp 16 | Ht 69.0 in | Wt 184.0 lb

## 2023-02-12 DIAGNOSIS — N1831 Chronic kidney disease, stage 3a: Secondary | ICD-10-CM

## 2023-02-12 DIAGNOSIS — I82511 Chronic embolism and thrombosis of right femoral vein: Secondary | ICD-10-CM | POA: Diagnosis not present

## 2023-02-12 DIAGNOSIS — I420 Dilated cardiomyopathy: Secondary | ICD-10-CM

## 2023-02-12 DIAGNOSIS — G301 Alzheimer's disease with late onset: Secondary | ICD-10-CM

## 2023-02-12 DIAGNOSIS — I10 Essential (primary) hypertension: Secondary | ICD-10-CM | POA: Diagnosis not present

## 2023-02-12 DIAGNOSIS — I480 Paroxysmal atrial fibrillation: Secondary | ICD-10-CM | POA: Diagnosis not present

## 2023-02-12 DIAGNOSIS — I700 Atherosclerosis of aorta: Secondary | ICD-10-CM

## 2023-02-12 DIAGNOSIS — M109 Gout, unspecified: Secondary | ICD-10-CM | POA: Diagnosis not present

## 2023-02-12 DIAGNOSIS — R634 Abnormal weight loss: Secondary | ICD-10-CM

## 2023-02-12 DIAGNOSIS — F0280 Dementia in other diseases classified elsewhere without behavioral disturbance: Secondary | ICD-10-CM

## 2023-02-14 ENCOUNTER — Encounter: Payer: Self-pay | Admitting: Family Medicine

## 2023-02-24 ENCOUNTER — Ambulatory Visit: Payer: Medicare PPO | Admitting: Family Medicine

## 2023-03-15 ENCOUNTER — Other Ambulatory Visit: Payer: Self-pay | Admitting: Family Medicine

## 2023-03-15 DIAGNOSIS — I480 Paroxysmal atrial fibrillation: Secondary | ICD-10-CM

## 2023-03-15 DIAGNOSIS — N1831 Chronic kidney disease, stage 3a: Secondary | ICD-10-CM

## 2023-03-15 DIAGNOSIS — I420 Dilated cardiomyopathy: Secondary | ICD-10-CM

## 2023-03-15 DIAGNOSIS — I82511 Chronic embolism and thrombosis of right femoral vein: Secondary | ICD-10-CM

## 2023-05-06 DIAGNOSIS — I129 Hypertensive chronic kidney disease with stage 1 through stage 4 chronic kidney disease, or unspecified chronic kidney disease: Secondary | ICD-10-CM | POA: Diagnosis not present

## 2023-05-06 DIAGNOSIS — E785 Hyperlipidemia, unspecified: Secondary | ICD-10-CM | POA: Diagnosis not present

## 2023-05-06 DIAGNOSIS — D6869 Other thrombophilia: Secondary | ICD-10-CM | POA: Diagnosis not present

## 2023-05-06 DIAGNOSIS — M1990 Unspecified osteoarthritis, unspecified site: Secondary | ICD-10-CM | POA: Diagnosis not present

## 2023-05-06 DIAGNOSIS — M109 Gout, unspecified: Secondary | ICD-10-CM | POA: Diagnosis not present

## 2023-05-06 DIAGNOSIS — I4891 Unspecified atrial fibrillation: Secondary | ICD-10-CM | POA: Diagnosis not present

## 2023-05-06 DIAGNOSIS — N1831 Chronic kidney disease, stage 3a: Secondary | ICD-10-CM | POA: Diagnosis not present

## 2023-05-06 DIAGNOSIS — R32 Unspecified urinary incontinence: Secondary | ICD-10-CM | POA: Diagnosis not present

## 2023-05-06 DIAGNOSIS — R2681 Unsteadiness on feet: Secondary | ICD-10-CM | POA: Diagnosis not present

## 2023-05-23 ENCOUNTER — Other Ambulatory Visit: Payer: Self-pay | Admitting: Family Medicine

## 2023-05-23 DIAGNOSIS — M109 Gout, unspecified: Secondary | ICD-10-CM

## 2023-05-29 ENCOUNTER — Other Ambulatory Visit: Payer: Self-pay | Admitting: Family Medicine

## 2023-05-29 DIAGNOSIS — N401 Enlarged prostate with lower urinary tract symptoms: Secondary | ICD-10-CM

## 2023-06-13 ENCOUNTER — Encounter: Payer: Self-pay | Admitting: Family Medicine

## 2023-06-13 ENCOUNTER — Ambulatory Visit (INDEPENDENT_AMBULATORY_CARE_PROVIDER_SITE_OTHER): Payer: Medicare PPO | Admitting: Family Medicine

## 2023-06-13 VITALS — BP 134/76 | HR 77 | Resp 16 | Ht 69.0 in | Wt 192.1 lb

## 2023-06-13 DIAGNOSIS — I82511 Chronic embolism and thrombosis of right femoral vein: Secondary | ICD-10-CM

## 2023-06-13 DIAGNOSIS — I700 Atherosclerosis of aorta: Secondary | ICD-10-CM | POA: Diagnosis not present

## 2023-06-13 DIAGNOSIS — N1831 Chronic kidney disease, stage 3a: Secondary | ICD-10-CM

## 2023-06-13 DIAGNOSIS — G301 Alzheimer's disease with late onset: Secondary | ICD-10-CM

## 2023-06-13 DIAGNOSIS — I420 Dilated cardiomyopathy: Secondary | ICD-10-CM

## 2023-06-13 DIAGNOSIS — N401 Enlarged prostate with lower urinary tract symptoms: Secondary | ICD-10-CM

## 2023-06-13 DIAGNOSIS — K862 Cyst of pancreas: Secondary | ICD-10-CM

## 2023-06-13 DIAGNOSIS — R3915 Urgency of urination: Secondary | ICD-10-CM

## 2023-06-13 DIAGNOSIS — M109 Gout, unspecified: Secondary | ICD-10-CM

## 2023-06-13 DIAGNOSIS — I10 Essential (primary) hypertension: Secondary | ICD-10-CM

## 2023-06-13 DIAGNOSIS — I480 Paroxysmal atrial fibrillation: Secondary | ICD-10-CM

## 2023-06-13 DIAGNOSIS — F0280 Dementia in other diseases classified elsewhere without behavioral disturbance: Secondary | ICD-10-CM

## 2023-06-13 MED ORDER — DABIGATRAN ETEXILATE MESYLATE 150 MG PO CAPS
150.0000 mg | ORAL_CAPSULE | Freq: Two times a day (BID) | ORAL | 1 refills | Status: DC
Start: 2023-06-13 — End: 2023-10-17

## 2023-06-13 MED ORDER — LOSARTAN POTASSIUM 25 MG PO TABS
25.0000 mg | ORAL_TABLET | Freq: Every day | ORAL | 1 refills | Status: DC
Start: 2023-06-13 — End: 2023-10-17

## 2023-06-13 MED ORDER — TAMSULOSIN HCL 0.4 MG PO CAPS: 0.4000 mg | ORAL_CAPSULE | Freq: Every day | ORAL | 3 refills | Status: AC

## 2023-06-13 MED ORDER — ATORVASTATIN CALCIUM 20 MG PO TABS
20.0000 mg | ORAL_TABLET | Freq: Every day | ORAL | 3 refills | Status: DC
Start: 2023-06-13 — End: 2024-01-23

## 2023-06-13 NOTE — Progress Notes (Addendum)
 Name: Ronald Fleming   MRN: [id number]    DOB: 1935/04/05   Date:06/13/2023       Progress Note  Subjective  Chief Complaint  Chief Complaint  Patient presents with   Medical Management of Chronic Issues   HPI   Bradycardia: patient has home health and noticed his heart rate was low, he followed up with Dr. Juliann Pares  and heart rate on arrival was 38 but on EKG - per wife - heart rate was in the 70's, today is in the 70's . No syncope , increase in SOB or chest pain   OA both knees with gait instability: he is doing better,  completed  PT , he now is using a cane to help with balance. He takes Tylenol prn Pain level today is zero at rest, but has pain when he first gets up , pain goes up with activity. Also has pain on left thumb   HTN: No chest pain he has occasional palpation. He denies dizziness, he is on lower dose of Losartan 25 mg and bp has been controlled.  BP at home dropped one day but no symptoms, we will continue current dose and reminded him to stay hydrated    Cardiomyopathy: under the care of Dr. Frederich Balding lower dose of losartan - currently at 25 mg daily - also on lasix due to cardiomyopathy with EF down to 40 %. He saw Dr Juliann Pares in August and is supposed to be taking furosemide but not on his medication bag today. Son will check with pharmacy    INTERPRETATION 04/16/2021 MILD LV SYSTOLIC DYSFUNCTION (See above)   WITH MODERATE LVH  NORMAL RIGHT VENTRICULAR SYSTOLIC FUNCTION  MODERATE VALVULAR REGURGITATION (See above)  NO VALVULAR STENOSIS  IRREGULAR HEART RHYTHM CAPTURED THROUGHOUT EXAM  ESTIMATED LVEF 40%  Aortic: MODERATE AI  Mitral: MODERATE - SEVERE MR; MAX VEL. 4.51m/s  Tricuspid: MODERATE TR (3.57m/s)  Pulmonic: MILD PI  MODERATE LAE  MILD RAE  MILDLY DILATED ASCENDING AORTA MEASURING 3.8cm  MILDLY DILATED AORTIC ROOT MEASURING 3.8cm    CKI stage III: stable, urine micro negative, denies pruritis, on ARB advised to stay off NSAID's. GFR stable in the 50's    Hyperlipidemia/Atherosclerosis Aorta : taking statins, denies side effects, last LDL at goal   Alzheimer's late onset: wife noticed he had memory loss, seeing Dr Sherryll Burger ( Neurologist ) he used to take Razadyne 12mg  twice daily e has been on medication since Fall 2017, he chose to stop medication years ago and is stable. . Since wife had brain surgery 2021 , both sons are now living in town, and Sugden lives next door and checks on them daily, cooking and helping around the house He is still able to perform ADL on his own, son's are supervising when he pays his bills.    Pre-diabetes:  he denies polyphagia, polydipsia, he has BPH and takes lasix daily and that also increases urinary frequency  Unchanged    Afib: he states doing well, occasional palpitation and SOB with activity that has been stable. Denies chest pain.  He is on Pradaxa and denies easy bleeding or bruising. Sees Dr Juliann Pares, he has been off aspirin because of risk of bleeding but is on pradaxa .    BPH: he has urinary frequency during the day,  he also has nocturia two to three times daily, but able to fall back asleep, he is on Flomax and sees Dr. Lonna Cobb. He has a history of hematuria secondary to kidney  stones , no episodes in a while   Pancreas cyst: found on CT for stone search, had repeat MRI 10/2018 and repeat was done 10/2019 . He has good appetite, no nausea, vomiting or abdominal pain. He continues to be symptoms free, and not willing to have more scans or follow ups for that Unchanged    IMPRESSION: 2.4 cm unilocular cystic lesion in the pancreatic body, showing progressive increase in size from 1.3 cm on 2019 exam. EUS/FNA should be considered for further evaluation. This recommendation follows ACR consensus guidelines: Management of Incidental Pancreatic Cysts: A White Paper of the ACR Incidental Findings Committee. J Am Coll Radiol 2017;14:911-923.   Chronic DVT right leg, also has varicose veins, no pain or  discomfort at this time, he has intermittent right lower extremity edema that resolves with compression stocking hoses .compliant with pradaxa    Gout: no recent episodes, but last uric acid was not at goal , he is now taking allopurinol bid and last labs at goal    Patient Active Problem List   Diagnosis Date Noted   Gait instability 08/01/2021   Primary osteoarthritis of both knees 08/01/2021   DDD (degenerative disc disease), lumbosacral 09/11/2017   Nephrolithiasis 09/11/2017   Vitamin D deficiency 09/11/2017   Lesion of pancreas 07/09/2017   Atherosclerosis of aorta (HCC) 07/01/2017   Osteopenia 05/21/2017   Atrioventricular block, second degree 05/29/2016   Vitamin B12 deficiency 05/21/2016   Late onset Alzheimer's disease without behavioral disturbance (HCC) 05/21/2016   Vitiligo 02/12/2016   BPH (benign prostatic hyperplasia) 08/03/2015   Atrial fibrillation (HCC) 02/01/2015   Neuropathy 08/23/2014   Benign essential HTN 08/27/2006   Hypercholesterolemia without hypertriglyceridemia 08/27/2006    Past Surgical History:  Procedure Laterality Date   COLONOSCOPY     HERNIA REPAIR  11/09/2017   INGUINAL HERNIA REPAIR Right 11/10/2017   Procedure: LAPAROSCOPIC RIGHT  INGUINAL HERNIA REPAIR;  Surgeon: Axel Filler, MD;  Location: [facility] OR;  Service: General;  Laterality: Right;   INGUINAL HERNIA REPAIR Left 02/23/2020   Procedure: LAPAROSCOPIC LEFT  INGUINAL HERNIA WITH MESH;  Surgeon: Axel Filler, MD;  Location: [facility] OR;  Service: General;  Laterality: Left;   INSERTION OF MESH Right 11/10/2017   Procedure: INSERTION OF MESH;  Surgeon: Axel Filler, MD;  Location: [facility] OR;  Service: General;  Laterality: Right;    Family History  Problem Relation Age of Onset   Coronary artery disease Father    Stroke Son    Kidney disease Neg Hx    Prostate cancer Neg Hx    Bladder Cancer Neg Hx     Social History   Tobacco Use   Smoking status: Never   Smokeless tobacco:  Never  Substance Use Topics   Alcohol use: Never    Alcohol/week: 0.0 standard drinks of alcohol     Current Outpatient Medications:    allopurinol (ZYLOPRIM) 100 MG tablet, TAKE 1 TABLET TWICE DAILY, Disp: 180 tablet, Rfl: 3   atorvastatin (LIPITOR) 20 MG tablet, TAKE 1 TABLET EVERY DAY, Disp: 90 tablet, Rfl: 3   Cholecalciferol (VITAMIN D) 50 MCG (2000 UT) CAPS, Take 1 capsule (2,000 Units total) by mouth daily., Disp: 100 capsule, Rfl: 3   dabigatran (PRADAXA) 150 MG CAPS capsule, TAKE 1 CAPSULE TWICE DAILY, Disp: 180 capsule, Rfl: 1   furosemide (LASIX) 20 MG tablet, Take 1 tablet by mouth in the morning., Disp: , Rfl:    losartan (COZAAR) 25 MG tablet, TAKE 1 TABLET EVERY DAY, Disp:  90 tablet, Rfl: 1   tamsulosin (FLOMAX) 0.4 MG CAPS capsule, TAKE 1 CAPSULE EVERY DAY, Disp: 90 capsule, Rfl: 3   vitamin B-12 (CYANOCOBALAMIN) 1000 MCG tablet, Take 2,000 mcg by mouth 3 (three) times a week. , Disp: , Rfl:   No Known Allergies  I personally reviewed active problem list, medication list, allergies, family history with the patient/caregiver today.   ROS  Ten systems reviewed and is negative except as mentioned in HPI    Objective Physical Exam Constitutional: Patient appears well-developed and well-nourished. No distress.  HEENT: head atraumatic, normocephalic, pupils equal and reactive to light, neck supple Cardiovascular: Normal rate, irregular rhythm and normal heart sounds.  No murmur heard. No BLE edema. Pulmonary/Chest: Effort normal and breath sounds normal. No respiratory distress. Abdominal: Soft.  There is no tenderness. Muscular skeletal: using a cane, walks slowly  Psychiatric: Patient has a normal mood and affect. behavior is normal. Judgment and thought content normal.   Vitals:   06/13/23 1303  BP: 134/76  Pulse: 77  Resp: 16  SpO2: 99%  Weight: 192 lb 1.6 oz (87.1 kg)  Height: 5\' 9"  (1.753 m)    Body mass index is 28.37 kg/m.  No results found for this  or any previous visit (from the past 2160 hours).  Diabetic Foot Exam:     PHQ2/9:    06/13/2023   12:58 PM 02/12/2023    9:34 AM 10/23/2022   10:37 AM 10/17/2022   11:42 AM 06/19/2022    9:45 AM  Depression screen PHQ 2/9  Decreased Interest 0 0 0 0 0  Down, Depressed, Hopeless 0 0 0 0 0  PHQ - 2 Score 0 0 0 0 0  Altered sleeping 0 0 0  0  Tired, decreased energy 0 0 0  0  Change in appetite 0 0 0  0  Feeling bad or failure about yourself  0 0 0  0  Trouble concentrating 0 0 0  0  Moving slowly or fidgety/restless 0 0 0  0  Suicidal thoughts 0 0 0  0  PHQ-9 Score 0 0 0  0  Difficult doing work/chores Not difficult at all        phq 9 is negative  Fall Risk:    06/13/2023   12:58 PM 02/12/2023    9:34 AM 10/23/2022   10:37 AM 10/17/2022   11:38 AM 06/19/2022    9:45 AM  Fall Risk   Falls in the past year? 0 0 0 0 0  Number falls in past yr: 0 0  0 0  Injury with Fall? 0 0  0 0  Risk for fall due to : No Fall Risks No Fall Risks No Fall Risks No Fall Risks No Fall Risks  Follow up Falls prevention discussed;Education provided;Falls evaluation completed Falls prevention discussed Falls prevention discussed Education provided;Falls prevention discussed Falls prevention discussed     Assessment & Plan  1. Atherosclerosis of aorta (HCC) (Primary)  - atorvastatin (LIPITOR) 20 MG tablet; Take 1 tablet (20 mg total) by mouth daily.  Dispense: 90 tablet; Refill: 3  2. Chronic deep vein thrombosis (DVT) of right femoral vein (HCC)  - dabigatran (PRADAXA) 150 MG CAPS capsule; Take 1 capsule (150 mg total) by mouth 2 (two) times daily.  Dispense: 180 capsule; Refill: 1  3. Stage 3a chronic kidney disease (HCC)  - losartan (COZAAR) 25 MG tablet; Take 1 tablet (25 mg total) by mouth daily.  Dispense: 90 tablet; Refill:  1  4. Dilated cardiomyopathy (HCC)  - losartan (COZAAR) 25 MG tablet; Take 1 tablet (25 mg total) by mouth daily.  Dispense: 90 tablet; Refill: 1  5. Paroxysmal  atrial fibrillation (HCC)  - dabigatran (PRADAXA) 150 MG CAPS capsule; Take 1 capsule (150 mg total) by mouth 2 (two) times daily.  Dispense: 180 capsule; Refill: 1  6. Late onset Alzheimer's disease without behavioral disturbance (HCC)  Doing well - discussed importance of pills packs to avoid confusion when taking bp medication  7. Benign essential HTN  - losartan (COZAAR) 25 MG tablet; Take 1 tablet (25 mg total) by mouth daily.  Dispense: 90 tablet; Refill: 1  8. Controlled gout  Continue allopurinol  9. Pancreatic cyst   10. Benign prostatic hyperplasia (BPH) with urinary urgency  - tamsulosin (FLOMAX) 0.4 MG CAPS capsule; Take 1 capsule (0.4 mg total) by mouth daily.  Dispense: 90 capsule; Refill: 3

## 2023-10-17 ENCOUNTER — Encounter: Payer: Self-pay | Admitting: Family Medicine

## 2023-10-17 ENCOUNTER — Ambulatory Visit: Admitting: Family Medicine

## 2023-10-17 VITALS — BP 122/80 | HR 57 | Resp 16 | Ht 69.0 in | Wt 191.8 lb

## 2023-10-17 DIAGNOSIS — F0280 Dementia in other diseases classified elsewhere without behavioral disturbance: Secondary | ICD-10-CM

## 2023-10-17 DIAGNOSIS — E559 Vitamin D deficiency, unspecified: Secondary | ICD-10-CM

## 2023-10-17 DIAGNOSIS — I10 Essential (primary) hypertension: Secondary | ICD-10-CM | POA: Diagnosis not present

## 2023-10-17 DIAGNOSIS — N401 Enlarged prostate with lower urinary tract symptoms: Secondary | ICD-10-CM

## 2023-10-17 DIAGNOSIS — I82511 Chronic embolism and thrombosis of right femoral vein: Secondary | ICD-10-CM

## 2023-10-17 DIAGNOSIS — K862 Cyst of pancreas: Secondary | ICD-10-CM

## 2023-10-17 DIAGNOSIS — I420 Dilated cardiomyopathy: Secondary | ICD-10-CM | POA: Diagnosis not present

## 2023-10-17 DIAGNOSIS — I480 Paroxysmal atrial fibrillation: Secondary | ICD-10-CM

## 2023-10-17 DIAGNOSIS — M109 Gout, unspecified: Secondary | ICD-10-CM | POA: Diagnosis not present

## 2023-10-17 DIAGNOSIS — I5022 Chronic systolic (congestive) heart failure: Secondary | ICD-10-CM | POA: Diagnosis not present

## 2023-10-17 DIAGNOSIS — I700 Atherosclerosis of aorta: Secondary | ICD-10-CM

## 2023-10-17 DIAGNOSIS — N1831 Chronic kidney disease, stage 3a: Secondary | ICD-10-CM | POA: Diagnosis not present

## 2023-10-17 DIAGNOSIS — M170 Bilateral primary osteoarthritis of knee: Secondary | ICD-10-CM

## 2023-10-17 DIAGNOSIS — E538 Deficiency of other specified B group vitamins: Secondary | ICD-10-CM | POA: Diagnosis not present

## 2023-10-17 DIAGNOSIS — G301 Alzheimer's disease with late onset: Secondary | ICD-10-CM

## 2023-10-17 MED ORDER — DABIGATRAN ETEXILATE MESYLATE 150 MG PO CAPS
150.0000 mg | ORAL_CAPSULE | Freq: Two times a day (BID) | ORAL | 1 refills | Status: DC
Start: 2023-10-17 — End: 2024-01-23

## 2023-10-17 MED ORDER — POTASSIUM CHLORIDE CRYS ER 20 MEQ PO TBCR: 20.0000 meq | EXTENDED_RELEASE_TABLET | Freq: Every day | ORAL | 0 refills | Status: AC | PRN

## 2023-10-17 MED ORDER — LOSARTAN POTASSIUM 25 MG PO TABS: 25.0000 mg | ORAL_TABLET | Freq: Every day | ORAL | 1 refills | Status: DC

## 2023-10-17 MED ORDER — FUROSEMIDE 20 MG PO TABS: 20.0000 mg | ORAL_TABLET | Freq: Every day | ORAL | 0 refills | Status: AC | PRN

## 2023-10-17 NOTE — Progress Notes (Signed)
 Name: Ronald Fleming   MRN: [id number]    DOB: [birth year]   Date:10/17/2023       Progress Note  Subjective  Chief Complaint  Chief Complaint  Patient presents with   Medical Management of Chronic Issues   Discussed the use of AI scribe software for clinical note transcription with the patient, who gave verbal consent to proceed.  History of Present Illness Ronald Fleming is an 88 year old male with chronic systolic heart failure and bradycardia who presents for a regular follow-up visit.  He has chronic systolic heart failure with an ejection fraction of 40%. Over the past one to two weeks, he has experienced increased bilateral leg swelling and shortness of breath. He takes losartan  25 mg and furosemide as needed for swelling, noting that the swelling worsens in the heat. No significant dyspnea when lying flat at night, using one pillow.  He has a history of bradycardia, with a baseline heart rate in the 70s. Previously, his heart rate was recorded at 38 bpm during an EKG, but today it is 57 bpm. He has not experienced symptoms such as syncope or dyspnea when his heart rate was low.  He has chronic kidney disease stage 3 with a GFR of 50, maintaining good urine output without itching. He takes losartan  for this condition. His dyslipidemia is managed with atorvastatin  20 mg, and his cholesterol levels are well controlled. He has a history of atherosclerosis of the aorta.  He has paroxysmal atrial fibrillation and denies palpitations or feeling out of rhythm. He does not take beta blockers or calcium  channel blockers due to his low heart rate. He takes Pradaxa  150 mg twice daily for anticoagulation.  He has a history of gout, currently without any attacks, managed with allopurinol  100 mg twice daily. He also has chronic DVT in the right femoral vein and reports swelling in both legs, not just the right.  He has a history of BPH with nocturia two to three times per night and takes Flomax , which  he feels is not sufficiently effective. He has a history of hematuria due to kidney stones but has had no recent stones.  He has a pancreatic cyst found incidentally in 2020, stable with no symptoms except for slight weight loss. He has Alzheimer's disease, diagnosed in 2017, with memory loss noted by his wife. He has two sons who assist with household tasks, though he manages his finances independently. He uses a cane due to balance issues, not arthritis, and declines physical therapy for balance.    Patient Active Problem List   Diagnosis Date Noted   Gait instability 08/01/2021   Primary osteoarthritis of both knees 08/01/2021   DDD (degenerative disc disease), lumbosacral 09/11/2017   Nephrolithiasis 09/11/2017   Vitamin D  deficiency 09/11/2017   Lesion of pancreas 07/09/2017   Atherosclerosis of aorta (HCC) 07/01/2017   Osteopenia 05/21/2017   Atrioventricular block, second degree 05/29/2016   Vitamin B12 deficiency 05/21/2016   Late onset Alzheimer's disease without behavioral disturbance (HCC) 05/21/2016   Vitiligo 02/12/2016   BPH (benign prostatic hyperplasia) 08/03/2015   Atrial fibrillation (HCC) 02/01/2015   Neuropathy 08/23/2014   Benign essential HTN 08/27/2006   Hypercholesterolemia without hypertriglyceridemia 08/27/2006    Past Surgical History:  Procedure Laterality Date   COLONOSCOPY     HERNIA REPAIR  11/09/2017   INGUINAL HERNIA REPAIR Right 11/10/2017   Procedure: LAPAROSCOPIC RIGHT  INGUINAL HERNIA REPAIR;  Surgeon: Rubin Calamity, MD;  Location: [facility] OR;  Service: General;  Laterality: Right;   INGUINAL HERNIA REPAIR Left 02/23/2020   Procedure: LAPAROSCOPIC LEFT  INGUINAL HERNIA WITH MESH;  Surgeon: Rubin Calamity, MD;  Location: [facility] OR;  Service: General;  Laterality: Left;   INSERTION OF MESH Right 11/10/2017   Procedure: INSERTION OF MESH;  Surgeon: Rubin Calamity, MD;  Location: [facility] OR;  Service: General;  Laterality: Right;    Family History  Problem  Relation Age of Onset   Coronary artery disease Father    Stroke Son    Kidney disease Neg Hx    Prostate cancer Neg Hx    Bladder Cancer Neg Hx     Social History   Tobacco Use   Smoking status: Never   Smokeless tobacco: Never  Substance Use Topics   Alcohol use: Never    Alcohol/week: 0.0 standard drinks of alcohol     Current Outpatient Medications:    allopurinol  (ZYLOPRIM ) 100 MG tablet, TAKE 1 TABLET TWICE DAILY, Disp: 180 tablet, Rfl: 3   atorvastatin  (LIPITOR) 20 MG tablet, Take 1 tablet (20 mg total) by mouth daily., Disp: 90 tablet, Rfl: 3   Cholecalciferol (VITAMIN D ) 50 MCG (2000 UT) CAPS, Take 1 capsule (2,000 Units total) by mouth daily., Disp: 100 capsule, Rfl: 3   dabigatran  (PRADAXA ) 150 MG CAPS capsule, Take 1 capsule (150 mg total) by mouth 2 (two) times daily., Disp: 180 capsule, Rfl: 1   losartan  (COZAAR ) 25 MG tablet, Take 1 tablet (25 mg total) by mouth daily., Disp: 90 tablet, Rfl: 1   tamsulosin  (FLOMAX ) 0.4 MG CAPS capsule, Take 1 capsule (0.4 mg total) by mouth daily., Disp: 90 capsule, Rfl: 3   vitamin B-12 (CYANOCOBALAMIN ) 1000 MCG tablet, Take 2,000 mcg by mouth 3 (three) times a week. , Disp: , Rfl:   No Known Allergies  I personally reviewed active problem list, medication list, allergies, family history with the patient/caregiver today.   ROS  Ten systems reviewed and is negative except as mentioned in HPI    Objective Physical Exam VITALS: P- 57, BP- 134/76 MEASUREMENTS: Weight- 191. CONSTITUTIONAL: Patient appears well-developed and well-nourished. No distress. HEENT: Head atraumatic, normocephalic, neck supple. CARDIOVASCULAR: Normal rate, regular rhythm and normal heart sounds. No murmur heard. 1+ edema in legs. PULMONARY: Effort normal. Crackles at lung bases. ABDOMINAL: There is no tenderness or distention. MUSCULOSKELETAL: Normal gait. Without gross motor or sensory deficit. PSYCHIATRIC: Patient has a normal mood and affect.  Behavior is normal. Judgment and thought content normal.  Vitals:   10/17/23 1000  BP: 122/80  Pulse: (!) 57  Resp: 16  SpO2: 95%  Weight: 191 lb 12.8 oz (87 kg)  Height: 5' 9 (1.753 m)    Body mass index is 28.32 kg/m.    PHQ2/9:    10/17/2023    9:53 AM 06/13/2023   12:58 PM 02/12/2023    9:34 AM 10/23/2022   10:37 AM 10/17/2022   11:42 AM  Depression screen PHQ 2/9  Decreased Interest 0 0 0 0 0  Down, Depressed, Hopeless 0 0 0 0 0  PHQ - 2 Score 0 0 0 0 0  Altered sleeping  0 0 0   Tired, decreased energy  0 0 0   Change in appetite  0 0 0   Feeling bad or failure about yourself   0 0 0   Trouble concentrating  0 0 0   Moving slowly or fidgety/restless  0 0 0   Suicidal thoughts  0 0 0   PHQ-9 Score  0 0 0   Difficult doing work/chores  Not difficult at all       phq 9 is negative  Fall Risk:    10/17/2023    9:53 AM 06/13/2023   12:58 PM 02/12/2023    9:34 AM 10/23/2022   10:37 AM 10/17/2022   11:38 AM  Fall Risk   Falls in the past year? 0 0 0 0 0  Number falls in past yr: 0 0 0  0  Injury with Fall? 0 0 0  0  Risk for fall due to : No Fall Risks No Fall Risks No Fall Risks No Fall Risks No Fall Risks  Follow up Falls evaluation completed Falls prevention discussed;Education provided;Falls evaluation completed Falls prevention discussed Falls prevention discussed Education provided;Falls prevention discussed      Assessment & Plan Chronic systolic heart failure with cardiomyopathy and volume overload Ejection fraction 40%. Increased leg swelling and shortness of breath. Crackles at lung bases indicate fluid overload. - Order BNP to assess for heart failure exacerbation. - Prescribe additional diuretic for three days. - Instruct to take potassium supplement with diuretic. - Advise leg elevation and symptom monitoring. - Instruct to go to ER if symptoms worsen.  Bradycardia Current heart rate 57 bpm, baseline in 70s. No syncope or shortness of breath.  Previous EKG showed heart rate of 38 bpm. Pacemaker considered if heart rate drops significantly.  Paroxysmal atrial fibrillation Managed with Pradaxa . No palpitations or rhythm disturbances. Not on beta-blockers or calcium  channel blockers due to low heart rate. - Continue Pradaxa  150 mg twice daily.  Chronic kidney disease stage 3a  Stable kidney function, GFR 50. No symptoms of itching or decreased urine output. - Order comprehensive metabolic panel.  Atherosclerosis of aorta Managed with atorvastatin . LDL controlled at 68 mg/dL. No side effects. - Continue atorvastatin  20 mg daily.  Gout Well controlled with allopurinol . No recent attacks. - Continue allopurinol  100 mg twice daily.  Chronic deep vein thrombosis of right femoral vein Swelling in both legs. Managed with Pradaxa . - Continue Pradaxa  150 mg twice daily.  Benign prostatic hyperplasia with lower urinary tract symptoms Nocturia 2-3 times per night. Flomax  not effective enough.  Pancreatic cyst, stable Incidental finding in 2020.  Alzheimer's disease, late onset Diagnosed in 2017. Managed with family support. Sons assist with daily activities.  Balance impairment Uses cane for stability. Declined physical therapy.

## 2023-10-18 LAB — COMPREHENSIVE METABOLIC PANEL WITH GFR
AG Ratio: 1.5 (calc) (ref 1.0–2.5)
ALT: 56 U/L — ABNORMAL HIGH (ref 9–46)
AST: 28 U/L (ref 10–35)
Albumin: 3.6 g/dL (ref 3.6–5.1)
Alkaline phosphatase (APISO): 94 U/L (ref 35–144)
BUN/Creatinine Ratio: 15 (calc) (ref 6–22)
BUN: 20 mg/dL (ref 7–25)
CO2: 26 mmol/L (ref 20–32)
Calcium: 8.9 mg/dL (ref 8.6–10.3)
Chloride: 107 mmol/L (ref 98–110)
Creat: 1.31 mg/dL — ABNORMAL HIGH (ref 0.70–1.22)
Globulin: 2.4 g/dL (ref 1.9–3.7)
Glucose, Bld: 95 mg/dL (ref 65–99)
Potassium: 3.7 mmol/L (ref 3.5–5.3)
Sodium: 142 mmol/L (ref 135–146)
Total Bilirubin: 1.2 mg/dL (ref 0.2–1.2)
Total Protein: 6 g/dL — ABNORMAL LOW (ref 6.1–8.1)
eGFR: 53 mL/min/1.73m2 — ABNORMAL LOW (ref >=60)

## 2023-10-18 LAB — LIPID PANEL
Cholesterol: 122 mg/dL (ref <200)
HDL: 49 mg/dL (ref >=40)
LDL Cholesterol (Calc): 59 mg/dL
Non-HDL Cholesterol (Calc): 73 mg/dL (ref <130)
Total CHOL/HDL Ratio: 2.5 (calc) (ref <5.0)
Triglycerides: 61 mg/dL (ref <150)

## 2023-10-18 LAB — CBC WITH DIFFERENTIAL/PLATELET
Absolute Lymphocytes: 907 {cells}/uL (ref 850–3900)
Absolute Monocytes: 518 {cells}/uL (ref 200–950)
Basophils Absolute: 7 {cells}/uL (ref 0–200)
Basophils Relative: 0.1 %
Eosinophils Absolute: 58 {cells}/uL (ref 15–500)
Eosinophils Relative: 0.8 %
HCT: 45.5 % (ref 38.5–50.0)
Hemoglobin: 13.8 g/dL (ref 13.2–17.1)
MCH: 31.4 pg (ref 27.0–33.0)
MCHC: 30.3 g/dL — ABNORMAL LOW (ref 32.0–36.0)
MCV: 103.6 fL — ABNORMAL HIGH (ref 80.0–100.0)
MPV: 11.9 fL (ref 7.5–12.5)
Monocytes Relative: 7.2 %
Neutro Abs: 5710 {cells}/uL (ref 1500–7800)
Neutrophils Relative %: 79.3 %
Platelets: 158 Thousand/uL (ref 140–400)
RBC: 4.39 Million/uL (ref 4.20–5.80)
RDW: 12.6 % (ref 11.0–15.0)
Total Lymphocyte: 12.6 %
WBC: 7.2 Thousand/uL (ref 3.8–10.8)

## 2023-10-18 LAB — BRAIN NATRIURETIC PEPTIDE: Brain Natriuretic Peptide: 1289 pg/mL — ABNORMAL HIGH (ref <100)

## 2023-10-18 LAB — VITAMIN B12: Vitamin B-12: 1022 pg/mL (ref 200–1100)

## 2023-10-18 LAB — VITAMIN D 25 HYDROXY (VIT D DEFICIENCY, FRACTURES): Vit D, 25-Hydroxy: 26 ng/mL — ABNORMAL LOW (ref 30–100)

## 2023-10-21 ENCOUNTER — Ambulatory Visit: Payer: Self-pay | Admitting: Nurse Practitioner

## 2023-10-22 DIAGNOSIS — I5021 Acute systolic (congestive) heart failure: Secondary | ICD-10-CM | POA: Diagnosis not present

## 2023-10-22 DIAGNOSIS — I441 Atrioventricular block, second degree: Secondary | ICD-10-CM | POA: Diagnosis not present

## 2023-10-22 DIAGNOSIS — I272 Pulmonary hypertension, unspecified: Secondary | ICD-10-CM | POA: Diagnosis not present

## 2023-10-22 DIAGNOSIS — N179 Acute kidney failure, unspecified: Secondary | ICD-10-CM | POA: Diagnosis not present

## 2023-10-22 DIAGNOSIS — R0600 Dyspnea, unspecified: Secondary | ICD-10-CM | POA: Diagnosis not present

## 2023-10-22 DIAGNOSIS — R7989 Other specified abnormal findings of blood chemistry: Secondary | ICD-10-CM | POA: Diagnosis not present

## 2023-10-22 DIAGNOSIS — I5023 Acute on chronic systolic (congestive) heart failure: Secondary | ICD-10-CM | POA: Diagnosis not present

## 2023-10-22 DIAGNOSIS — I371 Nonrheumatic pulmonary valve insufficiency: Secondary | ICD-10-CM | POA: Diagnosis not present

## 2023-10-22 DIAGNOSIS — R079 Chest pain, unspecified: Secondary | ICD-10-CM | POA: Diagnosis not present

## 2023-10-22 DIAGNOSIS — I083 Combined rheumatic disorders of mitral, aortic and tricuspid valves: Secondary | ICD-10-CM | POA: Diagnosis not present

## 2023-10-22 DIAGNOSIS — E785 Hyperlipidemia, unspecified: Secondary | ICD-10-CM | POA: Diagnosis not present

## 2023-10-22 DIAGNOSIS — N183 Chronic kidney disease, stage 3 unspecified: Secondary | ICD-10-CM | POA: Diagnosis not present

## 2023-10-22 DIAGNOSIS — I82511 Chronic embolism and thrombosis of right femoral vein: Secondary | ICD-10-CM | POA: Diagnosis not present

## 2023-10-22 DIAGNOSIS — Z7901 Long term (current) use of anticoagulants: Secondary | ICD-10-CM | POA: Diagnosis not present

## 2023-10-22 DIAGNOSIS — I4811 Longstanding persistent atrial fibrillation: Secondary | ICD-10-CM | POA: Diagnosis not present

## 2023-10-22 DIAGNOSIS — I509 Heart failure, unspecified: Secondary | ICD-10-CM | POA: Diagnosis not present

## 2023-10-22 DIAGNOSIS — F0280 Dementia in other diseases classified elsewhere without behavioral disturbance: Secondary | ICD-10-CM | POA: Diagnosis not present

## 2023-10-22 DIAGNOSIS — I82509 Chronic embolism and thrombosis of unspecified deep veins of unspecified lower extremity: Secondary | ICD-10-CM | POA: Diagnosis not present

## 2023-10-22 DIAGNOSIS — I130 Hypertensive heart and chronic kidney disease with heart failure and stage 1 through stage 4 chronic kidney disease, or unspecified chronic kidney disease: Secondary | ICD-10-CM | POA: Diagnosis not present

## 2023-10-22 DIAGNOSIS — I4891 Unspecified atrial fibrillation: Secondary | ICD-10-CM | POA: Diagnosis not present

## 2023-10-22 DIAGNOSIS — I82531 Chronic embolism and thrombosis of right popliteal vein: Secondary | ICD-10-CM | POA: Diagnosis not present

## 2023-10-22 DIAGNOSIS — R944 Abnormal results of kidney function studies: Secondary | ICD-10-CM | POA: Diagnosis not present

## 2023-10-22 DIAGNOSIS — G309 Alzheimer's disease, unspecified: Secondary | ICD-10-CM | POA: Diagnosis not present

## 2023-10-23 DIAGNOSIS — R0600 Dyspnea, unspecified: Secondary | ICD-10-CM | POA: Diagnosis not present

## 2023-10-27 DIAGNOSIS — E785 Hyperlipidemia, unspecified: Secondary | ICD-10-CM | POA: Diagnosis not present

## 2023-10-27 DIAGNOSIS — I5023 Acute on chronic systolic (congestive) heart failure: Secondary | ICD-10-CM | POA: Diagnosis not present

## 2023-10-27 DIAGNOSIS — I441 Atrioventricular block, second degree: Secondary | ICD-10-CM | POA: Diagnosis not present

## 2023-10-27 DIAGNOSIS — N183 Chronic kidney disease, stage 3 unspecified: Secondary | ICD-10-CM | POA: Diagnosis not present

## 2023-10-27 DIAGNOSIS — I480 Paroxysmal atrial fibrillation: Secondary | ICD-10-CM | POA: Diagnosis not present

## 2023-10-27 DIAGNOSIS — F0280 Dementia in other diseases classified elsewhere without behavioral disturbance: Secondary | ICD-10-CM | POA: Diagnosis not present

## 2023-10-27 DIAGNOSIS — I130 Hypertensive heart and chronic kidney disease with heart failure and stage 1 through stage 4 chronic kidney disease, or unspecified chronic kidney disease: Secondary | ICD-10-CM | POA: Diagnosis not present

## 2023-10-27 DIAGNOSIS — G309 Alzheimer's disease, unspecified: Secondary | ICD-10-CM | POA: Diagnosis not present

## 2023-10-27 DIAGNOSIS — N179 Acute kidney failure, unspecified: Secondary | ICD-10-CM | POA: Diagnosis not present

## 2023-11-03 DIAGNOSIS — G4733 Obstructive sleep apnea (adult) (pediatric): Secondary | ICD-10-CM | POA: Diagnosis not present

## 2023-11-03 DIAGNOSIS — I480 Paroxysmal atrial fibrillation: Secondary | ICD-10-CM | POA: Diagnosis not present

## 2023-11-03 DIAGNOSIS — N1831 Chronic kidney disease, stage 3a: Secondary | ICD-10-CM | POA: Diagnosis not present

## 2023-11-03 DIAGNOSIS — I420 Dilated cardiomyopathy: Secondary | ICD-10-CM | POA: Diagnosis not present

## 2023-11-03 DIAGNOSIS — I10 Essential (primary) hypertension: Secondary | ICD-10-CM | POA: Diagnosis not present

## 2023-11-03 DIAGNOSIS — E7800 Pure hypercholesterolemia, unspecified: Secondary | ICD-10-CM | POA: Diagnosis not present

## 2023-11-03 DIAGNOSIS — R001 Bradycardia, unspecified: Secondary | ICD-10-CM | POA: Diagnosis not present

## 2023-11-03 DIAGNOSIS — R0602 Shortness of breath: Secondary | ICD-10-CM | POA: Diagnosis not present

## 2023-11-04 DIAGNOSIS — I5023 Acute on chronic systolic (congestive) heart failure: Secondary | ICD-10-CM | POA: Diagnosis not present

## 2023-11-04 DIAGNOSIS — G309 Alzheimer's disease, unspecified: Secondary | ICD-10-CM | POA: Diagnosis not present

## 2023-11-04 DIAGNOSIS — E785 Hyperlipidemia, unspecified: Secondary | ICD-10-CM | POA: Diagnosis not present

## 2023-11-04 DIAGNOSIS — I4891 Unspecified atrial fibrillation: Secondary | ICD-10-CM | POA: Diagnosis not present

## 2023-11-04 DIAGNOSIS — N183 Chronic kidney disease, stage 3 unspecified: Secondary | ICD-10-CM | POA: Diagnosis not present

## 2023-11-04 DIAGNOSIS — Z86718 Personal history of other venous thrombosis and embolism: Secondary | ICD-10-CM | POA: Diagnosis not present

## 2023-11-04 DIAGNOSIS — N1832 Chronic kidney disease, stage 3b: Secondary | ICD-10-CM | POA: Diagnosis not present

## 2023-11-04 DIAGNOSIS — M109 Gout, unspecified: Secondary | ICD-10-CM | POA: Diagnosis not present

## 2023-11-04 DIAGNOSIS — I5020 Unspecified systolic (congestive) heart failure: Secondary | ICD-10-CM | POA: Diagnosis not present

## 2023-11-04 DIAGNOSIS — F0280 Dementia in other diseases classified elsewhere without behavioral disturbance: Secondary | ICD-10-CM | POA: Diagnosis not present

## 2023-11-04 DIAGNOSIS — N400 Enlarged prostate without lower urinary tract symptoms: Secondary | ICD-10-CM | POA: Diagnosis not present

## 2023-11-04 DIAGNOSIS — E860 Dehydration: Secondary | ICD-10-CM | POA: Diagnosis not present

## 2023-11-05 DIAGNOSIS — N183 Chronic kidney disease, stage 3 unspecified: Secondary | ICD-10-CM | POA: Diagnosis not present

## 2023-11-05 DIAGNOSIS — I5023 Acute on chronic systolic (congestive) heart failure: Secondary | ICD-10-CM | POA: Diagnosis not present

## 2023-11-05 DIAGNOSIS — F0280 Dementia in other diseases classified elsewhere without behavioral disturbance: Secondary | ICD-10-CM | POA: Diagnosis not present

## 2023-11-05 DIAGNOSIS — N179 Acute kidney failure, unspecified: Secondary | ICD-10-CM | POA: Diagnosis not present

## 2023-11-05 DIAGNOSIS — I480 Paroxysmal atrial fibrillation: Secondary | ICD-10-CM | POA: Diagnosis not present

## 2023-11-05 DIAGNOSIS — I441 Atrioventricular block, second degree: Secondary | ICD-10-CM | POA: Diagnosis not present

## 2023-11-05 DIAGNOSIS — I130 Hypertensive heart and chronic kidney disease with heart failure and stage 1 through stage 4 chronic kidney disease, or unspecified chronic kidney disease: Secondary | ICD-10-CM | POA: Diagnosis not present

## 2023-11-05 DIAGNOSIS — I4891 Unspecified atrial fibrillation: Secondary | ICD-10-CM | POA: Diagnosis not present

## 2023-11-05 DIAGNOSIS — E785 Hyperlipidemia, unspecified: Secondary | ICD-10-CM | POA: Diagnosis not present

## 2023-11-05 DIAGNOSIS — G309 Alzheimer's disease, unspecified: Secondary | ICD-10-CM | POA: Diagnosis not present

## 2023-11-07 DIAGNOSIS — I5023 Acute on chronic systolic (congestive) heart failure: Secondary | ICD-10-CM | POA: Diagnosis not present

## 2023-11-07 DIAGNOSIS — E785 Hyperlipidemia, unspecified: Secondary | ICD-10-CM | POA: Diagnosis not present

## 2023-11-07 DIAGNOSIS — N183 Chronic kidney disease, stage 3 unspecified: Secondary | ICD-10-CM | POA: Diagnosis not present

## 2023-11-07 DIAGNOSIS — F0280 Dementia in other diseases classified elsewhere without behavioral disturbance: Secondary | ICD-10-CM | POA: Diagnosis not present

## 2023-11-07 DIAGNOSIS — I130 Hypertensive heart and chronic kidney disease with heart failure and stage 1 through stage 4 chronic kidney disease, or unspecified chronic kidney disease: Secondary | ICD-10-CM | POA: Diagnosis not present

## 2023-11-07 DIAGNOSIS — G309 Alzheimer's disease, unspecified: Secondary | ICD-10-CM | POA: Diagnosis not present

## 2023-11-07 DIAGNOSIS — I480 Paroxysmal atrial fibrillation: Secondary | ICD-10-CM | POA: Diagnosis not present

## 2023-11-07 DIAGNOSIS — N179 Acute kidney failure, unspecified: Secondary | ICD-10-CM | POA: Diagnosis not present

## 2023-11-07 DIAGNOSIS — I441 Atrioventricular block, second degree: Secondary | ICD-10-CM | POA: Diagnosis not present

## 2023-11-10 DIAGNOSIS — F0280 Dementia in other diseases classified elsewhere without behavioral disturbance: Secondary | ICD-10-CM | POA: Diagnosis not present

## 2023-11-10 DIAGNOSIS — I441 Atrioventricular block, second degree: Secondary | ICD-10-CM | POA: Diagnosis not present

## 2023-11-10 DIAGNOSIS — G309 Alzheimer's disease, unspecified: Secondary | ICD-10-CM | POA: Diagnosis not present

## 2023-11-10 DIAGNOSIS — N183 Chronic kidney disease, stage 3 unspecified: Secondary | ICD-10-CM | POA: Diagnosis not present

## 2023-11-10 DIAGNOSIS — N179 Acute kidney failure, unspecified: Secondary | ICD-10-CM | POA: Diagnosis not present

## 2023-11-10 DIAGNOSIS — I5023 Acute on chronic systolic (congestive) heart failure: Secondary | ICD-10-CM | POA: Diagnosis not present

## 2023-11-10 DIAGNOSIS — I480 Paroxysmal atrial fibrillation: Secondary | ICD-10-CM | POA: Diagnosis not present

## 2023-11-10 DIAGNOSIS — I130 Hypertensive heart and chronic kidney disease with heart failure and stage 1 through stage 4 chronic kidney disease, or unspecified chronic kidney disease: Secondary | ICD-10-CM | POA: Diagnosis not present

## 2023-11-10 DIAGNOSIS — E785 Hyperlipidemia, unspecified: Secondary | ICD-10-CM | POA: Diagnosis not present

## 2023-11-11 DIAGNOSIS — G309 Alzheimer's disease, unspecified: Secondary | ICD-10-CM | POA: Diagnosis not present

## 2023-11-11 DIAGNOSIS — I130 Hypertensive heart and chronic kidney disease with heart failure and stage 1 through stage 4 chronic kidney disease, or unspecified chronic kidney disease: Secondary | ICD-10-CM | POA: Diagnosis not present

## 2023-11-11 DIAGNOSIS — F0280 Dementia in other diseases classified elsewhere without behavioral disturbance: Secondary | ICD-10-CM | POA: Diagnosis not present

## 2023-11-11 DIAGNOSIS — E785 Hyperlipidemia, unspecified: Secondary | ICD-10-CM | POA: Diagnosis not present

## 2023-11-11 DIAGNOSIS — N183 Chronic kidney disease, stage 3 unspecified: Secondary | ICD-10-CM | POA: Diagnosis not present

## 2023-11-11 DIAGNOSIS — I5023 Acute on chronic systolic (congestive) heart failure: Secondary | ICD-10-CM | POA: Diagnosis not present

## 2023-11-11 DIAGNOSIS — I441 Atrioventricular block, second degree: Secondary | ICD-10-CM | POA: Diagnosis not present

## 2023-11-11 DIAGNOSIS — I480 Paroxysmal atrial fibrillation: Secondary | ICD-10-CM | POA: Diagnosis not present

## 2023-11-11 DIAGNOSIS — N179 Acute kidney failure, unspecified: Secondary | ICD-10-CM | POA: Diagnosis not present

## 2023-11-14 DIAGNOSIS — I441 Atrioventricular block, second degree: Secondary | ICD-10-CM | POA: Diagnosis not present

## 2023-11-14 DIAGNOSIS — N183 Chronic kidney disease, stage 3 unspecified: Secondary | ICD-10-CM | POA: Diagnosis not present

## 2023-11-14 DIAGNOSIS — I480 Paroxysmal atrial fibrillation: Secondary | ICD-10-CM | POA: Diagnosis not present

## 2023-11-14 DIAGNOSIS — G309 Alzheimer's disease, unspecified: Secondary | ICD-10-CM | POA: Diagnosis not present

## 2023-11-14 DIAGNOSIS — I130 Hypertensive heart and chronic kidney disease with heart failure and stage 1 through stage 4 chronic kidney disease, or unspecified chronic kidney disease: Secondary | ICD-10-CM | POA: Diagnosis not present

## 2023-11-14 DIAGNOSIS — E785 Hyperlipidemia, unspecified: Secondary | ICD-10-CM | POA: Diagnosis not present

## 2023-11-14 DIAGNOSIS — N179 Acute kidney failure, unspecified: Secondary | ICD-10-CM | POA: Diagnosis not present

## 2023-11-14 DIAGNOSIS — F0280 Dementia in other diseases classified elsewhere without behavioral disturbance: Secondary | ICD-10-CM | POA: Diagnosis not present

## 2023-11-14 DIAGNOSIS — I5023 Acute on chronic systolic (congestive) heart failure: Secondary | ICD-10-CM | POA: Diagnosis not present

## 2023-11-18 DIAGNOSIS — N183 Chronic kidney disease, stage 3 unspecified: Secondary | ICD-10-CM | POA: Diagnosis not present

## 2023-11-18 DIAGNOSIS — I480 Paroxysmal atrial fibrillation: Secondary | ICD-10-CM | POA: Diagnosis not present

## 2023-11-18 DIAGNOSIS — I441 Atrioventricular block, second degree: Secondary | ICD-10-CM | POA: Diagnosis not present

## 2023-11-18 DIAGNOSIS — E785 Hyperlipidemia, unspecified: Secondary | ICD-10-CM | POA: Diagnosis not present

## 2023-11-18 DIAGNOSIS — N179 Acute kidney failure, unspecified: Secondary | ICD-10-CM | POA: Diagnosis not present

## 2023-11-18 DIAGNOSIS — I5023 Acute on chronic systolic (congestive) heart failure: Secondary | ICD-10-CM | POA: Diagnosis not present

## 2023-11-18 DIAGNOSIS — I130 Hypertensive heart and chronic kidney disease with heart failure and stage 1 through stage 4 chronic kidney disease, or unspecified chronic kidney disease: Secondary | ICD-10-CM | POA: Diagnosis not present

## 2023-11-18 DIAGNOSIS — F0280 Dementia in other diseases classified elsewhere without behavioral disturbance: Secondary | ICD-10-CM | POA: Diagnosis not present

## 2023-11-18 DIAGNOSIS — G309 Alzheimer's disease, unspecified: Secondary | ICD-10-CM | POA: Diagnosis not present

## 2023-11-19 DIAGNOSIS — I130 Hypertensive heart and chronic kidney disease with heart failure and stage 1 through stage 4 chronic kidney disease, or unspecified chronic kidney disease: Secondary | ICD-10-CM | POA: Diagnosis not present

## 2023-11-19 DIAGNOSIS — I441 Atrioventricular block, second degree: Secondary | ICD-10-CM | POA: Diagnosis not present

## 2023-11-19 DIAGNOSIS — I480 Paroxysmal atrial fibrillation: Secondary | ICD-10-CM | POA: Diagnosis not present

## 2023-11-19 DIAGNOSIS — I5023 Acute on chronic systolic (congestive) heart failure: Secondary | ICD-10-CM | POA: Diagnosis not present

## 2023-11-19 DIAGNOSIS — N183 Chronic kidney disease, stage 3 unspecified: Secondary | ICD-10-CM | POA: Diagnosis not present

## 2023-11-19 DIAGNOSIS — E785 Hyperlipidemia, unspecified: Secondary | ICD-10-CM | POA: Diagnosis not present

## 2023-11-19 DIAGNOSIS — F0280 Dementia in other diseases classified elsewhere without behavioral disturbance: Secondary | ICD-10-CM | POA: Diagnosis not present

## 2023-11-19 DIAGNOSIS — G309 Alzheimer's disease, unspecified: Secondary | ICD-10-CM | POA: Diagnosis not present

## 2023-11-19 DIAGNOSIS — N179 Acute kidney failure, unspecified: Secondary | ICD-10-CM | POA: Diagnosis not present

## 2023-11-25 ENCOUNTER — Encounter: Payer: Self-pay | Admitting: Family Medicine

## 2023-11-28 ENCOUNTER — Ambulatory Visit: Payer: Self-pay | Admitting: Internal Medicine

## 2023-11-28 ENCOUNTER — Encounter: Payer: Self-pay | Admitting: Internal Medicine

## 2023-11-28 ENCOUNTER — Ambulatory Visit
Admission: RE | Admit: 2023-11-28 | Discharge: 2023-11-28 | Disposition: A | Discharge status: Home / Self Care | Attending: Internal Medicine | Admitting: Internal Medicine

## 2023-11-28 ENCOUNTER — Ambulatory Visit (INDEPENDENT_AMBULATORY_CARE_PROVIDER_SITE_OTHER): Admitting: Internal Medicine

## 2023-11-28 ENCOUNTER — Ambulatory Visit
Admission: RE | Admit: 2023-11-28 | Discharge: 2023-11-28 | Disposition: A | Discharge status: Home / Self Care | Source: Ambulatory Visit | Attending: Internal Medicine | Admitting: Internal Medicine

## 2023-11-28 ENCOUNTER — Other Ambulatory Visit
Admission: RE | Admit: 2023-11-28 | Discharge: 2023-11-28 | Disposition: A | Discharge status: Home / Self Care | Source: Ambulatory Visit | Attending: Internal Medicine | Admitting: Internal Medicine

## 2023-11-28 VITALS — BP 120/70 | HR 77 | Temp 97.9°F | Resp 16 | Ht 69.0 in | Wt 156.5 lb

## 2023-11-28 DIAGNOSIS — I509 Heart failure, unspecified: Secondary | ICD-10-CM | POA: Diagnosis not present

## 2023-11-28 DIAGNOSIS — R634 Abnormal weight loss: Secondary | ICD-10-CM

## 2023-11-28 DIAGNOSIS — I5022 Chronic systolic (congestive) heart failure: Secondary | ICD-10-CM

## 2023-11-28 LAB — CBC WITH DIFFERENTIAL/PLATELET
Abs Immature Granulocytes: 0.06 K/uL (ref 0.00–0.07)
Basophils Absolute: 0 K/uL (ref 0.0–0.1)
Basophils Relative: 0 %
Eosinophils Absolute: 0.1 K/uL (ref 0.0–0.5)
Eosinophils Relative: 1 %
HCT: 45.3 % (ref 39.0–52.0)
Hemoglobin: 15.2 g/dL (ref 13.0–17.0)
Immature Granulocytes: 1 %
Lymphocytes Relative: 8 %
Lymphs Abs: 0.9 K/uL (ref 0.7–4.0)
MCH: 32.1 pg (ref 26.0–34.0)
MCHC: 33.6 g/dL (ref 30.0–36.0)
MCV: 95.6 fL (ref 80.0–100.0)
Monocytes Absolute: 0.9 K/uL (ref 0.1–1.0)
Monocytes Relative: 9 %
Neutro Abs: 8.6 K/uL — ABNORMAL HIGH (ref 1.7–7.7)
Neutrophils Relative %: 81 %
Platelets: 152 K/uL (ref 150–400)
RBC: 4.74 MIL/uL (ref 4.22–5.81)
RDW: 12.7 % (ref 11.5–15.5)
WBC: 10.6 K/uL — ABNORMAL HIGH (ref 4.0–10.5)
nRBC: 0 % (ref 0.0–0.2)

## 2023-11-28 LAB — BASIC METABOLIC PANEL WITH GFR
Anion gap: 12 (ref 5–15)
BUN: 58 mg/dL — ABNORMAL HIGH (ref 8–23)
CO2: 23 mmol/L (ref 22–32)
Calcium: 9.3 mg/dL (ref 8.9–10.3)
Chloride: 97 mmol/L — ABNORMAL LOW (ref 98–111)
Creatinine, Ser: 2.73 mg/dL — ABNORMAL HIGH (ref 0.61–1.24)
GFR, Estimated: 22 mL/min — ABNORMAL LOW (ref >60)
Glucose, Bld: 110 mg/dL — ABNORMAL HIGH (ref 70–99)
Potassium: 4.4 mmol/L (ref 3.5–5.1)
Sodium: 132 mmol/L — ABNORMAL LOW (ref 135–145)

## 2023-11-28 LAB — BRAIN NATRIURETIC PEPTIDE: B Natriuretic Peptide: 90.8 pg/mL (ref 0.0–100.0)

## 2023-11-28 NOTE — Progress Notes (Signed)
 Acute Office Visit  Subjective:     Patient ID: Ronald Fleming, male    DOB: February 16, 1936, 88 y.o.   MRN: [id number]  Chief Complaint  Patient presents with   Weight Loss    No appetite, fatigue. Seen at [facility] on 8/19 weight was 170    HPI Patient is in today for weight loss and decreased appetite. He is a patient of Dr. Glenard and this is my first time meeting him. He is here with his wife and son.   Discussed the use of AI scribe software for clinical note transcription with the patient, who gave verbal consent to proceed.  History of Present Illness Ronald Fleming is an 88 year old male with heart failure, CKD and atrial fibrillation who presents with significant weight loss and shortness of breath. He is accompanied by his son, Ronald Fleming.  He has lost approximately 40 pounds since March, with a current weight of 156 pounds. He has a decreased appetite, often consuming less than half of his meals, and sometimes only a few bites. He does not feel hungry and denies stomach pain. He attempts to supplement his nutrition with Ensure and Boost but does not do so regularly.  He experiences shortness of breath and was recently hospitalized for heart failure exacerbation at [facility], with heart function noted at 25%. Swelling in his legs has improved. He has not taken furosemide  for a week due to a misunderstanding about taking potassium chloride  with it.  His medications include allopurinol , amiodarone, carvedilol, a statin, Pradaxa , Jardiance 10 mg, losartan , potassium, and spironolactone. Bernadine was recently added following his hospital discharge. He has normal bowel movements every couple of days, which he attributes to reduced food intake. No difficulty breathing at rest.   Review of Systems  Constitutional:  Positive for malaise/fatigue and weight loss.  Respiratory:  Negative for shortness of breath.   Cardiovascular:  Negative for chest pain and leg swelling.        Objective:    BP  120/70 (Cuff Size: Normal)   Pulse 77   Temp 97.9 F (36.6 C) (Oral)   Resp 16   Ht 5' 9 (1.753 m)   Wt 156 lb 8 oz (71 kg)   BMI 23.11 kg/m  BP Readings from Last 3 Encounters:  11/28/23 120/70  10/17/23 122/80  06/13/23 134/76   Wt Readings from Last 3 Encounters:  11/28/23 156 lb 8 oz (71 kg)  10/17/23 191 lb 12.8 oz (87 kg)  06/13/23 192 lb 1.6 oz (87.1 kg)      Physical Exam Constitutional:      Appearance: Normal appearance. He is ill-appearing.  HENT:     Head: Normocephalic and atraumatic.  Eyes:     Conjunctiva/sclera: Conjunctivae normal.  Cardiovascular:     Rate and Rhythm: Normal rate and regular rhythm.  Pulmonary:     Effort: Pulmonary effort is normal.     Breath sounds: Normal breath sounds. No rales.  Musculoskeletal:     Right lower leg: No edema.     Left lower leg: No edema.  Skin:    General: Skin is warm and dry.     Capillary Refill: Capillary refill takes 2 to 3 seconds.  Neurological:     General: No focal deficit present.     Mental Status: He is alert. Mental status is at baseline.  Psychiatric:        Mood and Affect: Mood normal.  Behavior: Behavior normal.     No results found for any visits on 11/28/23.      Assessment & Plan:   Assessment & Plan Heart failure with reduced ejection fraction and chronic kidney disease Severe heart failure with ejection fraction at 25%. Chronic kidney disease complicates management. Recent exacerbation with fluid retention and shortness of breath. Risk of fluid overload and electrolyte imbalance due to medication adjustments. - Order stat labs to check potassium levels and kidney function. - Continue current heart medications including amiodarone, carvedilol, losartan , spironolactone, and Jardiance. - Educated on the importance of taking potassium supplements with Lasix .  Atrial fibrillation Chronic atrial fibrillation managed with Pradaxa  and other heart medications. - Continue  Pradaxa  for anticoagulation.  Unintentional weight loss and decreased appetite Significant weight loss with decreased appetite and reduced meal intake. Risk of malnutrition due to inadequate nutritional intake. - Encourage high-protein nutritional supplements like Boost or Ensure. - Advise to consume supplements if meal intake is less than 50%. - Monitor weight and nutritional intake closely.  - CBC w/Diff/Platelet; Future - Basic Metabolic Panel (BMET); Future - B Nat Peptide; Future  Return in about 1 week (around 12/05/2023) for Dr. Glenard .  Sharyle Fischer, DO

## 2023-12-01 ENCOUNTER — Ambulatory Visit: Admitting: Family Medicine

## 2023-12-01 ENCOUNTER — Encounter: Payer: Self-pay | Admitting: Family Medicine

## 2023-12-01 VITALS — BP 104/66 | HR 63 | Resp 16 | Ht 69.0 in | Wt 157.0 lb

## 2023-12-01 DIAGNOSIS — I5022 Chronic systolic (congestive) heart failure: Secondary | ICD-10-CM

## 2023-12-01 DIAGNOSIS — E860 Dehydration: Secondary | ICD-10-CM

## 2023-12-01 DIAGNOSIS — N179 Acute kidney failure, unspecified: Secondary | ICD-10-CM | POA: Diagnosis not present

## 2023-12-01 DIAGNOSIS — N189 Chronic kidney disease, unspecified: Secondary | ICD-10-CM | POA: Diagnosis not present

## 2023-12-01 DIAGNOSIS — E46 Unspecified protein-calorie malnutrition: Secondary | ICD-10-CM | POA: Diagnosis not present

## 2023-12-01 DIAGNOSIS — D72829 Elevated white blood cell count, unspecified: Secondary | ICD-10-CM | POA: Diagnosis not present

## 2023-12-01 DIAGNOSIS — R2681 Unsteadiness on feet: Secondary | ICD-10-CM

## 2023-12-01 MED ORDER — AMIODARONE HCL 200 MG PO TABS: 200.0000 mg | ORAL_TABLET | Freq: Every day | ORAL | 0 refills | Status: DC

## 2023-12-01 NOTE — Patient Instructions (Signed)
 High-Protein and High-Calorie Diet Eating high-protein and high-calorie foods can help you to gain weight, heal after an injury, and get better after an illness or surgery. The amount of daily protein and calories you need depends on: Your body weight. The reason you were told to follow this diet. Usually, a high-protein, high-calorie diet means that you should: Eat 250-500 extra calories each day. Make sure that you get enough of your daily calories from protein. Ask your health care provider how many of your calories should come from protein and how many calories total you need each day. Follow the diet as told by your provider. What are tips for following this plan? Reading food labels Check the nutrition facts label for calories, and grams of fat and protein. Items with more than 4 grams of protein are high-protein foods. General information  Ask your provider if you should take a nutritional supplement. Try to eat six small meals each day instead of three large meals. A goal is usually to eat every 2 to 3 hours. Eat a balanced diet. In each meal, include one food that's high in protein and one food with fat in it. Keep nutritious snacks available, such as nuts, trail mixes, dried fruit, and whole-milk yogurt. If you have kidney disease or diabetes, talk with your provider about how much protein is safe for you. Too much protein may put extra stress on your kidneys. Replace zero-calorie drinks with drinks that have calories in them, such as milk and 100% fruit juice. Consider setting a timer to remind you to eat. You'll want to eat even if you do not feel very hungry. Preparing meals Milk and dairy foods. Add whole milk, half-and-half, or heavy cream to cereal, pudding, soup, or hot cocoa. Add whole milk to instant breakfast drinks. Add powdered milk to baked goods, smoothies, or milkshakes. Add powdered milk, cream, or butter to mashed potatoes. Replace water with milk or heavy cream  when making foods such as oatmeal, pudding, or cocoa. Make cream-based pastas and soups. Add cheese to cooked vegetables. Make whole-milk yogurt parfaits. Top them with granola, fruit, or nuts. Add cottage cheese to fruit. Add cream cheese to sandwiches or as a topping on crackers and bread. Eggs. Add hard-boiled eggs to salads. Keep hard-boiled eggs in the fridge to snack on. Add cheese to cooked eggs. Beans, nuts, and seeds. Add peanut butter to oatmeal or smoothies. Use peanut butter as a dip for fruits and vegetables or as a topping for pretzels, celery, or crackers. Add beans to casseroles, dips, and spreads. Add pureed beans to sauces and soups. Salads, soups, and other foods. Add avocado, cheese, or both to sandwiches or salads. Add avocado to smoothies. Add meat, poultry, or seafood to rice, pasta, casseroles, salads, and soups. Use mayonnaise when making egg salad, chicken salad, or tuna salad. Add oil or butter to cooked vegetables and grains. What high-protein foods should I eat?  Vegetables Soybeans. Peas. Grains Quinoa. Bulgur wheat. Buckwheat. Meats and other proteins Beef, pork, and poultry. Fish and seafood. Eggs. Tofu. Textured vegetable protein (TVP). Peanut butter. Nuts and seeds. Dried beans. Protein powders. Hummus. Jerky. Dairy Whole milk. Whole-milk yogurt. Powdered milk. Cheese. Cottage cheese. Eggnog. Beverages High-protein supplement drinks. Soy milk. Other foods Protein bars. The items listed above may not be all the foods and drinks you can have. Talk with an expert in healthy eating called a dietitian to learn more. What high-calorie foods should I eat? Fruits Dried fruit. Fruit leather. Canned  fruit in syrup. Fruit juice. Avocado. Vegetables Vegetables cooked in oil or butter. Fried potatoes. Grains Pasta. Quick breads. Muffins. Pancakes. Granola. Meats and other proteins Peanut butter and other nut butters. Nuts and seeds. Dairy Heavy cream.  Whipped cream. Cream cheese. Sour cream. Ice cream. Custard. Pudding. Whole-milk dairy products. Beverages Meal-replacement beverages. Nutrition shakes. Fruit juice. Seasonings and condiments Salad dressing. Mayonnaise. Alfredo sauce. Fruit preserves or jelly. Honey. Syrup. Sweets and desserts Cake. Cookies. Pie. Pastries. Candy bars. Chocolate. Fats and oils Butter or margarine. Oil. Gravy. Other foods Meal-replacement bars. The items listed above may not be all the foods and drinks you can have. Talk with an expert in healthy eating to learn more. This information is not intended to replace advice given to you by your health care provider. Make sure you discuss any questions you have with your health care provider. Document Revised: 07/29/2022 Document Reviewed: 07/29/2022 Elsevier Patient Education  2024 [employer].

## 2023-12-01 NOTE — Progress Notes (Signed)
 Name: Ronald Fleming   MRN: [id number]    DOB: 28-Feb-1936   Date:12/01/2023       Progress Note  Subjective  Chief Complaint  Chief Complaint  Patient presents with   Weight Loss    Pt saw Dr.Andrews past friday   Discussed the use of AI scribe software for clinical note transcription with the patient, who gave verbal consent to proceed.  History of Present Illness MAISEN KLINGLER is an 88 year old male with congestive heart failure who presents for a hospital discharge follow-up.  He was recently discharged from [facility] following an acute exacerbation of congestive heart failure. During his hospital stay, his medication regimen was adjusted. Jardiance 10 mg was added for heart failure management. He was previously on furosemide , which was discontinued on September 5th due to concerns about kidney function. Potassium supplements were also stopped as they were taken in conjunction with furosemide .  He has experienced significant weight loss, dropping from 191 pounds in August to 157 pounds today. His appetite has been poor since his hospital discharge, contributing to his weight loss. He has no appetite and struggles to eat, which has led to weakness and the need for a walker instead of a cane. Despite this, he is able to perform daily activities independently but since discharge he has been very frail and sons are worried about leaving him home alone ( with his wife ) due to increase need for assistance and now depending on using a walker to walk   His current medications include metoprolol, losartan  25 mg, spironolactone 25 mg (half a pill), Jardiance 10 mg, atorvastatin  20 mg, Pradaxa  150 mg, and amiodarone  for atrial fibrillation. There is a concern about the availability of amiodarone  200 mg, no refills left at pharmacy and next visit with cardiologist not until Nov  He has a known lesion on his pancreas that he has been aware since 2021 , which he has opted not to pursue further  treatment at the time due to his age.   He experiences intermittent hiccups, which have been bothersome but are not constant. No confusion, shortness of breath, abdominal pain, or blood in stools. His son notes that he sometimes sleeps excessively, waking up late in the day.    Patient Active Problem List   Diagnosis Date Noted   Gait instability 08/01/2021   Primary osteoarthritis of both knees 08/01/2021   DDD (degenerative disc disease), lumbosacral 09/11/2017   Nephrolithiasis 09/11/2017   Vitamin D  deficiency 09/11/2017   Lesion of pancreas 07/09/2017   Atherosclerosis of aorta (HCC) 07/01/2017   Osteopenia 05/21/2017   Atrioventricular block, second degree 05/29/2016   Vitamin B12 deficiency 05/21/2016   Late onset Alzheimer's disease without behavioral disturbance (HCC) 05/21/2016   Vitiligo 02/12/2016   BPH (benign prostatic hyperplasia) 08/03/2015   Atrial fibrillation (HCC) 02/01/2015   Neuropathy 08/23/2014   Benign essential HTN 08/27/2006   Hypercholesterolemia without hypertriglyceridemia 08/27/2006    Past Surgical History:  Procedure Laterality Date   COLONOSCOPY     HERNIA REPAIR  11/09/2017   INGUINAL HERNIA REPAIR Right 11/10/2017   Procedure: LAPAROSCOPIC RIGHT  INGUINAL HERNIA REPAIR;  Surgeon: Rubin Calamity, MD;  Location: [facility] OR;  Service: General;  Laterality: Right;   INGUINAL HERNIA REPAIR Left 02/23/2020   Procedure: LAPAROSCOPIC LEFT  INGUINAL HERNIA WITH MESH;  Surgeon: Rubin Calamity, MD;  Location: [facility] OR;  Service: General;  Laterality: Left;   INSERTION OF MESH Right 11/10/2017   Procedure: INSERTION OF  MESH;  Surgeon: Rubin Calamity, MD;  Location[facility] OR;  Service: General;  Laterality: Right;    Family History  Problem Relation Age of Onset   Coronary artery disease Father    Stroke Son    Kidney disease Neg Hx    Prostate cancer Neg Hx    Bladder Cancer Neg Hx     Social History   Tobacco Use   Smoking status: Never   Smokeless  tobacco: Never  Substance Use Topics   Alcohol use: Never    Alcohol/week: 0.0 standard drinks of alcohol     Current Outpatient Medications:    allopurinol  (ZYLOPRIM ) 100 MG tablet, TAKE 1 TABLET TWICE DAILY, Disp: 180 tablet, Rfl: 3   amiodarone  (PACERONE ) 200 MG tablet, Take 200 mg by mouth., Disp: , Rfl:    atorvastatin  (LIPITOR) 20 MG tablet, Take 1 tablet (20 mg total) by mouth daily., Disp: 90 tablet, Rfl: 3   carvedilol (COREG) 3.125 MG tablet, Take 3.125 mg by mouth., Disp: , Rfl:    Cholecalciferol (VITAMIN D ) 50 MCG (2000 UT) CAPS, Take 1 capsule (2,000 Units total) by mouth daily., Disp: 100 capsule, Rfl: 3   dabigatran  (PRADAXA ) 150 MG CAPS capsule, Take 1 capsule (150 mg total) by mouth 2 (two) times daily., Disp: 180 capsule, Rfl: 1   JARDIANCE 10 MG TABS tablet, Take 10 mg by mouth daily., Disp: , Rfl:    losartan  (COZAAR ) 25 MG tablet, Take 1 tablet (25 mg total) by mouth daily., Disp: 90 tablet, Rfl: 1   spironolactone (ALDACTONE) 25 MG tablet, Take 12.5 mg by mouth., Disp: , Rfl:    tamsulosin  (FLOMAX ) 0.4 MG CAPS capsule, Take 1 capsule (0.4 mg total) by mouth daily., Disp: 90 capsule, Rfl: 3   vitamin B-12 (CYANOCOBALAMIN ) 1000 MCG tablet, Take 2,000 mcg by mouth 3 (three) times a week. , Disp: , Rfl:    furosemide  (LASIX ) 20 MG tablet, Take 1 tablet (20 mg total) by mouth daily as needed. For swelling and increase sob (Patient not taking: Reported on 12/01/2023), Disp: 90 tablet, Rfl: 0   potassium chloride  SA (KLOR-CON  M) 20 MEQ tablet, Take 1 tablet (20 mEq total) by mouth daily as needed. Only when you take furosemide  (Patient not taking: Reported on 12/01/2023), Disp: 90 tablet, Rfl: 0  No Known Allergies  I personally reviewed active problem list, medication list, allergies, family history with the patient/caregiver today.   ROS  Ten systems reviewed and is negative except as mentioned in HPI    Objective Physical Exam  CONSTITUTIONAL: Patient appears frail   HEENT: Head atraumatic, normocephalic, neck supple. CARDIOVASCULAR: Normal rate, regular rhythm and normal heart sounds. No murmur heard. No BLE edema. PULMONARY: Effort normal and breath sounds normal. No respiratory distress. ABDOMINAL: Abdomen non-tender, no distention. MUSCULOSKELETAL:slow, using a walker  PSYCHIATRIC: Patient has a normal mood and affect. Behavior is normal. Judgment and thought content normal.  Vitals:   12/01/23 1058  BP: 104/66  Pulse: 63  Resp: 16  SpO2: 91%  Weight: 157 lb (71.2 kg)  Height: 5' 9 (1.753 m)    Body mass index is 23.18 kg/m.  Recent Results (from the past 2160 hours)  Comprehensive metabolic panel with GFR     Status: Abnormal   Collection Time: 10/17/23 11:07 AM  Result Value Ref Range   Glucose, Bld 95 65 - 99 mg/dL    Comment: .            Fasting reference interval .  BUN 20 7 - 25 mg/dL   Creat 8.68 (H) 9.29 - 1.22 mg/dL   eGFR 53 (L) > OR = 60 mL/min/1.33m2   BUN/Creatinine Ratio 15 6 - 22 (calc)   Sodium 142 135 - 146 mmol/L   Potassium 3.7 3.5 - 5.3 mmol/L   Chloride 107 98 - 110 mmol/L   CO2 26 20 - 32 mmol/L   Calcium  8.9 8.6 - 10.3 mg/dL   Total Protein 6.0 (L) 6.1 - 8.1 g/dL   Albumin 3.6 3.6 - 5.1 g/dL   Globulin 2.4 1.9 - 3.7 g/dL (calc)   AG Ratio 1.5 1.0 - 2.5 (calc)   Total Bilirubin 1.2 0.2 - 1.2 mg/dL   Alkaline phosphatase (APISO) 94 35 - 144 U/L   AST 28 10 - 35 U/L   ALT 56 (H) 9 - 46 U/L  CBC with Differential/Platelet     Status: Abnormal   Collection Time: 10/17/23 11:07 AM  Result Value Ref Range   WBC 7.2 3.8 - 10.8 Thousand/uL   RBC 4.39 4.20 - 5.80 Million/uL   Hemoglobin 13.8 13.2 - 17.1 g/dL   HCT 54.4 61.4 - 49.9 %   MCV 103.6 (H) 80.0 - 100.0 fL   MCH 31.4 27.0 - 33.0 pg   MCHC 30.3 (L) 32.0 - 36.0 g/dL    Comment: For adults, a slight decrease in the calculated MCHC value (in the range of 30 to 32 g/dL) is most likely not clinically significant; however, it should be interpreted  with caution in correlation with other red cell parameters and the patient's clinical condition.    RDW 12.6 11.0 - 15.0 %   Platelets 158 140 - 400 Thousand/uL   MPV 11.9 7.5 - 12.5 fL   Neutro Abs 5,710 1,500 - 7,800 cells/uL   Absolute Lymphocytes 907 850 - 3,900 cells/uL   Absolute Monocytes 518 200 - 950 cells/uL   Eosinophils Absolute 58 15 - 500 cells/uL   Basophils Absolute 7 0 - 200 cells/uL   Neutrophils Relative % 79.3 %   Total Lymphocyte 12.6 %   Monocytes Relative 7.2 %   Eosinophils Relative 0.8 %   Basophils Relative 0.1 %  Lipid panel     Status: None   Collection Time: 10/17/23 11:07 AM  Result Value Ref Range   Cholesterol 122 <200 mg/dL   HDL 49 > OR = 40 mg/dL   Triglycerides 61 <849 mg/dL   LDL Cholesterol (Calc) 59 mg/dL (calc)    Comment: Reference range: <100 . Desirable range <100 mg/dL for primary prevention;   <70 mg/dL for patients with CHD or diabetic patients  with > or = 2 CHD risk factors. SABRA LDL-C is now calculated using the Martin-Hopkins  calculation, which is a validated novel method providing  better accuracy than the Friedewald equation in the  estimation of LDL-C.  Gladis APPLETHWAITE et al. SANDREA. 7986;689(80): 2061-2068  (http://education.[facility].com/faq/FAQ164)    Total CHOL/HDL Ratio 2.5 <5.0 (calc)   Non-HDL Cholesterol (Calc) 73 <869 mg/dL (calc)    Comment: For patients with diabetes plus 1 major ASCVD risk  factor, treating to a non-HDL-C goal of <100 mg/dL  (LDL-C of <29 mg/dL) is considered a therapeutic  option.   VITAMIN D  25 Hydroxy (Vit-D Deficiency, Fractures)     Status: Abnormal   Collection Time: 10/17/23 11:07 AM  Result Value Ref Range   Vit D, 25-Hydroxy 26 (L) 30 - 100 ng/mL    Comment: Vitamin D  Status  25-OH Vitamin D : . Deficiency:                    <20 ng/mL Insufficiency:             20 - 29 ng/mL Optimal:                 > or = 30 ng/mL . For 25-OH Vitamin D  testing on patients on   D2-supplementation and patients for whom quantitation  of D2 and D3 fractions is required, the QuestAssureD(TM) 25-OH VIT D, (D2,D3), LC/MS/MS is recommended: order  code 07111 (patients >64yrs). . See Note 1 . Note 1 . For additional information, please refer to  http://education.[facility].com/faq/FAQ199  (This link is being provided for informational/ educational purposes only.)   Vitamin B12     Status: None   Collection Time: 10/17/23 11:07 AM  Result Value Ref Range   Vitamin B-12 1,022 200 - 1,100 pg/mL  B Nat Peptide     Status: Abnormal   Collection Time: 10/17/23 11:07 AM  Result Value Ref Range   Brain Natriuretic Peptide 1,289 (H) <100 pg/mL    Comment: . BNP levels increase with age in the general population with the highest values seen in individuals greater than 68 years of age. Reference: J. Am. Penne. Cardiol. 2002; 59:023-017. SABRA   CBC w/Diff/Platelet     Status: Abnormal   Collection Time: 11/28/23 12:45 PM  Result Value Ref Range   WBC 10.6 (H) 4.0 - 10.5 K/uL   RBC 4.74 4.22 - 5.81 MIL/uL   Hemoglobin 15.2 13.0 - 17.0 g/dL   HCT 54.6 60.9 - 47.9 %   MCV 95.6 80.0 - 100.0 fL   MCH 32.1 26.0 - 34.0 pg   MCHC 33.6 30.0 - 36.0 g/dL   RDW 87.2 88.4 - 84.4 %   Platelets 152 150 - 400 K/uL   nRBC 0.0 0.0 - 0.2 %   Neutrophils Relative % 81 %   Neutro Abs 8.6 (H) 1.7 - 7.7 K/uL   Lymphocytes Relative 8 %   Lymphs Abs 0.9 0.7 - 4.0 K/uL   Monocytes Relative 9 %   Monocytes Absolute 0.9 0.1 - 1.0 K/uL   Eosinophils Relative 1 %   Eosinophils Absolute 0.1 0.0 - 0.5 K/uL   Basophils Relative 0 %   Basophils Absolute 0.0 0.0 - 0.1 K/uL   Immature Granulocytes 1 %   Abs Immature Granulocytes 0.06 0.00 - 0.07 K/uL    Comment: Performed at [facility], [address], [place], [place] [address]  Basic Metabolic Panel (BMET)     Status: Abnormal   Collection Time: 11/28/23 12:45 PM  Result Value Ref Range   Sodium 132 (L) 135 - 145 mmol/L    Potassium 4.4 3.5 - 5.1 mmol/L   Chloride 97 (L) 98 - 111 mmol/L   CO2 23 22 - 32 mmol/L   Glucose, Bld 110 (H) 70 - 99 mg/dL    Comment: Glucose reference range applies only to samples taken after fasting for at least 8 hours.   BUN 58 (H) 8 - 23 mg/dL   Creatinine, Ser 7.26 (H) 0.61 - 1.24 mg/dL   Calcium  9.3 8.9 - 10.3 mg/dL   GFR, Estimated 22 (L) >60 mL/min    Comment: (NOTE) Calculated using the CKD-EPI Creatinine Equation (2021)    Anion gap 12 5 - 15    Comment: Performed at [facility], [address] [place], [place] [address]  B Nat Peptide     Status: None   Collection Time: 11/28/23 12:45 PM  Result Value Ref Range   B Natriuretic Peptide 90.8 0.0 - 100.0 pg/mL    Comment: Performed at [facility], [address] [place], [place] [address]    Diabetic Foot Exam:     PHQ2/9:    12/01/2023   10:50 AM 10/17/2023    9:53 AM 06/13/2023   12:58 PM 02/12/2023    9:34 AM 10/23/2022   10:37 AM  Depression screen PHQ 2/9  Decreased Interest 0 0 0 0 0  Down, Depressed, Hopeless 0 0 0 0 0  PHQ - 2 Score 0 0 0 0 0  Altered sleeping   0 0 0  Tired, decreased energy   0 0 0  Change in appetite   0 0 0  Feeling bad or failure about yourself    0 0 0  Trouble concentrating   0 0 0  Moving slowly or fidgety/restless   0 0 0  Suicidal thoughts   0 0 0  PHQ-9 Score   0 0 0  Difficult doing work/chores   Not difficult at all      phq 9 is negative  Fall Risk:    12/01/2023   10:50 AM 10/17/2023    9:53 AM 06/13/2023   12:58 PM 02/12/2023    9:34 AM 10/23/2022   10:37 AM  Fall Risk   Falls in the past year? 1 0 0 0 0  Number falls in past yr: 0 0 0 0   Injury with Fall? 0 0 0 0   Risk for fall due to : Impaired balance/gait No Fall Risks No Fall Risks No Fall Risks No Fall Risks  Follow up Falls evaluation completed Falls evaluation completed Falls prevention discussed;Education provided;Falls evaluation completed Falls prevention discussed Falls  prevention discussed      Assessment & Plan Chronic systolic heart failure with recent acute exacerbation Recent exacerbation managed with hospitalization. Discharged with medication adjustments including Jardiance initiation. Weight loss noted, possibly related to heart failure. - Continue Jardiance 10 mg daily. - Monitor weight and symptoms of heart failure. - Encourage dietary intake to prevent further weight loss.  Protein-calorie malnutrition Significant weight loss and lack of appetite since hospitalization. Malnutrition likely contributing to weakness and gait instability. - Encourage frequent small meals and high-calorie snacks. - Provide list of high-calorie foods and encourage intake of protein shakes, peanut butter, and other nutrient-dense foods. - Monitor weight and nutritional status. - Refer to home health for nutritional support and monitoring.  Gait instability and generalized weakness Increased weakness and need for a walker. Likely multifactorial due to malnutrition, recent heart failure exacerbation, and medication effects. - Refer to home health for physical therapy to improve strength and balance. - Encourage nutritional intake to support muscle strength. - Monitor for improvement in gait and strength.  Atrial fibrillation on rhythm control therapy Managed with amiodarone . Concerns about interactions and side effects, but continuation advised until cardiology follow-up. - Continue amiodarone  as prescribed until cardiology follow-up. - Ensure cardiology follow-up to reassess need for amiodarone    Chronic kidney disease with recent acute kidney injury Furosemide  and potassium discontinued to protect kidney function. We will recheck labs today   Pancreatic lesion, not currently under evaluation Known pancreatic lesion not under evaluation due to decision to avoid surgical intervention. Potential contribution to lack of appetite considered. - Discuss potential  re-evaluation of pancreatic lesion if symptoms persist or  worsen. - Focus on nutritional support and monitoring.  Intermittent hiccups Intermittent hiccups possibly related to medication or other conditions. - Monitor frequency and severity of hiccups. - Consider medication if hiccups become more frequent or severe.

## 2023-12-02 ENCOUNTER — Ambulatory Visit: Payer: Self-pay | Admitting: Family Medicine

## 2023-12-02 DIAGNOSIS — F0280 Dementia in other diseases classified elsewhere without behavioral disturbance: Secondary | ICD-10-CM | POA: Diagnosis not present

## 2023-12-02 DIAGNOSIS — I130 Hypertensive heart and chronic kidney disease with heart failure and stage 1 through stage 4 chronic kidney disease, or unspecified chronic kidney disease: Secondary | ICD-10-CM | POA: Diagnosis not present

## 2023-12-02 DIAGNOSIS — N179 Acute kidney failure, unspecified: Secondary | ICD-10-CM | POA: Diagnosis not present

## 2023-12-02 DIAGNOSIS — I441 Atrioventricular block, second degree: Secondary | ICD-10-CM | POA: Diagnosis not present

## 2023-12-02 DIAGNOSIS — E785 Hyperlipidemia, unspecified: Secondary | ICD-10-CM | POA: Diagnosis not present

## 2023-12-02 DIAGNOSIS — I5023 Acute on chronic systolic (congestive) heart failure: Secondary | ICD-10-CM | POA: Diagnosis not present

## 2023-12-02 DIAGNOSIS — E860 Dehydration: Secondary | ICD-10-CM

## 2023-12-02 DIAGNOSIS — G309 Alzheimer's disease, unspecified: Secondary | ICD-10-CM | POA: Diagnosis not present

## 2023-12-02 DIAGNOSIS — N183 Chronic kidney disease, stage 3 unspecified: Secondary | ICD-10-CM | POA: Diagnosis not present

## 2023-12-02 DIAGNOSIS — I480 Paroxysmal atrial fibrillation: Secondary | ICD-10-CM | POA: Diagnosis not present

## 2023-12-02 DIAGNOSIS — N189 Chronic kidney disease, unspecified: Secondary | ICD-10-CM

## 2023-12-02 LAB — CBC WITH DIFFERENTIAL/PLATELET
Absolute Lymphocytes: 819 {cells}/uL — ABNORMAL LOW (ref 850–3900)
Absolute Monocytes: 757 {cells}/uL (ref 200–950)
Basophils Absolute: 36 {cells}/uL (ref 0–200)
Basophils Relative: 0.4 %
Eosinophils Absolute: 116 {cells}/uL (ref 15–500)
Eosinophils Relative: 1.3 %
HCT: 46.5 % (ref 38.5–50.0)
Hemoglobin: 15.2 g/dL (ref 13.2–17.1)
MCH: 32.6 pg (ref 27.0–33.0)
MCHC: 32.7 g/dL (ref 32.0–36.0)
MCV: 99.8 fL (ref 80.0–100.0)
MPV: 12.1 fL (ref 7.5–12.5)
Monocytes Relative: 8.5 %
Neutro Abs: 7173 {cells}/uL (ref 1500–7800)
Neutrophils Relative %: 80.6 %
Platelets: 155 Thousand/uL (ref 140–400)
RBC: 4.66 Million/uL (ref 4.20–5.80)
RDW: 13 % (ref 11.0–15.0)
Total Lymphocyte: 9.2 %
WBC: 8.9 Thousand/uL (ref 3.8–10.8)

## 2023-12-02 LAB — COMPREHENSIVE METABOLIC PANEL WITH GFR
AG Ratio: 1.3 (calc) (ref 1.0–2.5)
ALT: 15 U/L (ref 9–46)
AST: 18 U/L (ref 10–35)
Albumin: 3.9 g/dL (ref 3.6–5.1)
Alkaline phosphatase (APISO): 99 U/L (ref 35–144)
BUN/Creatinine Ratio: 23 (calc) — ABNORMAL HIGH (ref 6–22)
BUN: 49 mg/dL — ABNORMAL HIGH (ref 7–25)
CO2: 21 mmol/L (ref 20–32)
Calcium: 9.4 mg/dL (ref 8.6–10.3)
Chloride: 98 mmol/L (ref 98–110)
Creat: 2.14 mg/dL — ABNORMAL HIGH (ref 0.70–1.22)
Globulin: 3.1 g/dL (ref 1.9–3.7)
Glucose, Bld: 85 mg/dL (ref 65–99)
Potassium: 5 mmol/L (ref 3.5–5.3)
Sodium: 131 mmol/L — ABNORMAL LOW (ref 135–146)
Total Bilirubin: 0.8 mg/dL (ref 0.2–1.2)
Total Protein: 7 g/dL (ref 6.1–8.1)
eGFR: 29 mL/min/1.73m2 — ABNORMAL LOW (ref >=60)

## 2023-12-03 ENCOUNTER — Telehealth: Payer: Self-pay | Admitting: Family Medicine

## 2023-12-03 NOTE — Telephone Encounter (Unsigned)
 Copied from CRM [id number]. Topic: Referral - Question >> Dec 03, 2023 12:59 PM Winona SAUNDERS wrote: Stevphen from [facility] is calling to request home health aide be removed from the referral sent over for the. Call back number [place] [phone]

## 2023-12-04 NOTE — Telephone Encounter (Signed)
 Called Stevphen unable to get in touch and no vm.

## 2023-12-05 DIAGNOSIS — I480 Paroxysmal atrial fibrillation: Secondary | ICD-10-CM | POA: Diagnosis not present

## 2023-12-05 DIAGNOSIS — I441 Atrioventricular block, second degree: Secondary | ICD-10-CM | POA: Diagnosis not present

## 2023-12-05 DIAGNOSIS — E785 Hyperlipidemia, unspecified: Secondary | ICD-10-CM | POA: Diagnosis not present

## 2023-12-05 DIAGNOSIS — I5023 Acute on chronic systolic (congestive) heart failure: Secondary | ICD-10-CM | POA: Diagnosis not present

## 2023-12-05 DIAGNOSIS — G309 Alzheimer's disease, unspecified: Secondary | ICD-10-CM | POA: Diagnosis not present

## 2023-12-05 DIAGNOSIS — N179 Acute kidney failure, unspecified: Secondary | ICD-10-CM | POA: Diagnosis not present

## 2023-12-05 DIAGNOSIS — N183 Chronic kidney disease, stage 3 unspecified: Secondary | ICD-10-CM | POA: Diagnosis not present

## 2023-12-05 DIAGNOSIS — I130 Hypertensive heart and chronic kidney disease with heart failure and stage 1 through stage 4 chronic kidney disease, or unspecified chronic kidney disease: Secondary | ICD-10-CM | POA: Diagnosis not present

## 2023-12-05 DIAGNOSIS — F0280 Dementia in other diseases classified elsewhere without behavioral disturbance: Secondary | ICD-10-CM | POA: Diagnosis not present

## 2023-12-10 DIAGNOSIS — F0280 Dementia in other diseases classified elsewhere without behavioral disturbance: Secondary | ICD-10-CM | POA: Diagnosis not present

## 2023-12-10 DIAGNOSIS — G309 Alzheimer's disease, unspecified: Secondary | ICD-10-CM | POA: Diagnosis not present

## 2023-12-10 DIAGNOSIS — I130 Hypertensive heart and chronic kidney disease with heart failure and stage 1 through stage 4 chronic kidney disease, or unspecified chronic kidney disease: Secondary | ICD-10-CM | POA: Diagnosis not present

## 2023-12-10 DIAGNOSIS — N183 Chronic kidney disease, stage 3 unspecified: Secondary | ICD-10-CM | POA: Diagnosis not present

## 2023-12-10 DIAGNOSIS — I5023 Acute on chronic systolic (congestive) heart failure: Secondary | ICD-10-CM | POA: Diagnosis not present

## 2023-12-10 DIAGNOSIS — N179 Acute kidney failure, unspecified: Secondary | ICD-10-CM | POA: Diagnosis not present

## 2023-12-10 DIAGNOSIS — E785 Hyperlipidemia, unspecified: Secondary | ICD-10-CM | POA: Diagnosis not present

## 2023-12-10 DIAGNOSIS — I441 Atrioventricular block, second degree: Secondary | ICD-10-CM | POA: Diagnosis not present

## 2023-12-10 DIAGNOSIS — I480 Paroxysmal atrial fibrillation: Secondary | ICD-10-CM | POA: Diagnosis not present

## 2023-12-11 DIAGNOSIS — R001 Bradycardia, unspecified: Secondary | ICD-10-CM | POA: Diagnosis not present

## 2023-12-11 DIAGNOSIS — I480 Paroxysmal atrial fibrillation: Secondary | ICD-10-CM | POA: Diagnosis not present

## 2023-12-11 DIAGNOSIS — N1831 Chronic kidney disease, stage 3a: Secondary | ICD-10-CM | POA: Diagnosis not present

## 2023-12-11 DIAGNOSIS — E7800 Pure hypercholesterolemia, unspecified: Secondary | ICD-10-CM | POA: Diagnosis not present

## 2023-12-11 DIAGNOSIS — R0602 Shortness of breath: Secondary | ICD-10-CM | POA: Diagnosis not present

## 2023-12-11 DIAGNOSIS — I420 Dilated cardiomyopathy: Secondary | ICD-10-CM | POA: Diagnosis not present

## 2023-12-11 DIAGNOSIS — I441 Atrioventricular block, second degree: Secondary | ICD-10-CM | POA: Diagnosis not present

## 2023-12-11 DIAGNOSIS — I10 Essential (primary) hypertension: Secondary | ICD-10-CM | POA: Diagnosis not present

## 2023-12-11 DIAGNOSIS — G4733 Obstructive sleep apnea (adult) (pediatric): Secondary | ICD-10-CM | POA: Diagnosis not present

## 2023-12-12 ENCOUNTER — Telehealth: Payer: Self-pay

## 2023-12-12 ENCOUNTER — Ambulatory Visit: Payer: Self-pay

## 2023-12-12 DIAGNOSIS — I5023 Acute on chronic systolic (congestive) heart failure: Secondary | ICD-10-CM | POA: Diagnosis not present

## 2023-12-12 DIAGNOSIS — N183 Chronic kidney disease, stage 3 unspecified: Secondary | ICD-10-CM | POA: Diagnosis not present

## 2023-12-12 DIAGNOSIS — E785 Hyperlipidemia, unspecified: Secondary | ICD-10-CM | POA: Diagnosis not present

## 2023-12-12 DIAGNOSIS — F0280 Dementia in other diseases classified elsewhere without behavioral disturbance: Secondary | ICD-10-CM | POA: Diagnosis not present

## 2023-12-12 DIAGNOSIS — I130 Hypertensive heart and chronic kidney disease with heart failure and stage 1 through stage 4 chronic kidney disease, or unspecified chronic kidney disease: Secondary | ICD-10-CM | POA: Diagnosis not present

## 2023-12-12 DIAGNOSIS — G309 Alzheimer's disease, unspecified: Secondary | ICD-10-CM | POA: Diagnosis not present

## 2023-12-12 DIAGNOSIS — N179 Acute kidney failure, unspecified: Secondary | ICD-10-CM | POA: Diagnosis not present

## 2023-12-12 DIAGNOSIS — I441 Atrioventricular block, second degree: Secondary | ICD-10-CM | POA: Diagnosis not present

## 2023-12-12 DIAGNOSIS — I480 Paroxysmal atrial fibrillation: Secondary | ICD-10-CM | POA: Diagnosis not present

## 2023-12-12 NOTE — Telephone Encounter (Signed)
 Copied from CRM [id number]. Topic: Clinical - Request for Lab/Test Order >> Dec 12, 2023  1:34 PM Myrick T wrote: Reason for CRM: [name] called to get a hospice order for patient. Please send in orders to referral dept fax#[phone]

## 2023-12-12 NOTE — Telephone Encounter (Signed)
 Called [name] unable to leave vm due to being full. I have questions regards hospice order. Has the patient been there or is a new request for Hospice orders, if yes pt will need appt.

## 2023-12-12 NOTE — Telephone Encounter (Signed)
 Copied from CRM [id number]. Topic: Clinical - Red Word Triage >> Dec 12, 2023  2:52 PM Turkey B wrote: [name] from pallative care called in  weakness sleeping 14 hours , holding on to the walls and furniture to walk fall on 15, had to slide out of chair, hand tremors seling bp 90's

## 2023-12-12 NOTE — Telephone Encounter (Signed)
 Faxed urgent order/referral

## 2023-12-12 NOTE — Telephone Encounter (Signed)
 FYI Only or Action Required?: Action required by provider: referral request.  Patient was last seen in primary care on 12/01/2023 by [name], MD.  Called Nurse Triage reporting Fatigue.  Symptoms began several weeks ago.  Interventions attempted: Other: Palliative Care and Home PT.  Symptoms are: gradually worsening.  Triage Disposition: Call PCP Now  Patient/caregiver understands and will follow disposition?: Yes Palliative Care social worker [name] calling on behalf of family to request a hospice referral. Per [name], referral can be faxed to [phone]   Copied from CRM [id number]. Topic: Clinical - Red Word Triage >> Dec 12, 2023  2:52 PM Turkey B wrote: [name] from pallative care called in  weakness sleeping 14 hours , holding on to the walls and furniture to walk fall on 15, had to slide out of chair, hand tremors seling bp 90's     Reason for Disposition  [1] Follow-up call from patient regarding patient's clinical status AND [2] information urgent  Answer Assessment - Initial Assessment Questions 1. REASON FOR CALL or QUESTION: What is your reason for calling today? or How can I best     Call rec'd from [facility], Child psychotherapist with Palliative Care. She is calling on behalf of the family requesting a hospice consult.  Per [name], she has noticed a functional decline. She reports that patient sleeps 14-16 hours a day, he now has to hold onto walls and furnitue when he walks, and his tremors are worsening.  She also reports that pts BP has been in the 90s systolic.  Per [name], patient has 3 sons but one is currently hospitalized and the other 2 work full-time so they are requesting a hospice eval for added layer of support  2. CALLER: Document the source of call. (e.g., laboratory staff, caregiver or patient).     [name], Palliative Care social worker  Protocols used: PCP Call - No Triage-A-AH

## 2023-12-16 ENCOUNTER — Telehealth: Payer: Self-pay

## 2023-12-16 ENCOUNTER — Other Ambulatory Visit: Payer: Self-pay | Admitting: Family Medicine

## 2023-12-16 DIAGNOSIS — I5022 Chronic systolic (congestive) heart failure: Secondary | ICD-10-CM

## 2023-12-16 DIAGNOSIS — E46 Unspecified protein-calorie malnutrition: Secondary | ICD-10-CM

## 2023-12-16 DIAGNOSIS — F0280 Dementia in other diseases classified elsewhere without behavioral disturbance: Secondary | ICD-10-CM

## 2023-12-16 NOTE — Telephone Encounter (Signed)
 Copied from CRM #[id number]. Topic: General - Other >> Dec 16, 2023  3:38 PM Willma R wrote: Reason for CRM: Joen from [facility] calling to advise they received a hospice referral for the patient. She is calling to confirm if Dr Glenard will remain as their attending physician and if she believes the patient has more than 6 months longevity left.   Joen can be reached at [phone]

## 2023-12-17 NOTE — Telephone Encounter (Signed)
 Spoke to [place], need confirmation for- Certificate of Terminal illness, if Dr.Sowles believes the patient has more than 6 months longevity left?

## 2023-12-17 NOTE — Telephone Encounter (Signed)
 No answer left VMTCB

## 2023-12-19 ENCOUNTER — Ambulatory Visit (INDEPENDENT_AMBULATORY_CARE_PROVIDER_SITE_OTHER)

## 2023-12-19 DIAGNOSIS — Z23 Encounter for immunization: Secondary | ICD-10-CM

## 2024-01-14 ENCOUNTER — Encounter: Payer: Self-pay | Admitting: Family Medicine

## 2024-01-23 ENCOUNTER — Encounter: Payer: Self-pay | Admitting: Family Medicine

## 2024-01-23 ENCOUNTER — Ambulatory Visit: Admitting: Family Medicine

## 2024-01-23 VITALS — BP 106/62 | HR 60 | Resp 16 | Ht 69.0 in | Wt 149.8 lb

## 2024-01-23 DIAGNOSIS — E46 Unspecified protein-calorie malnutrition: Secondary | ICD-10-CM | POA: Diagnosis not present

## 2024-01-23 DIAGNOSIS — I82511 Chronic embolism and thrombosis of right femoral vein: Secondary | ICD-10-CM | POA: Diagnosis not present

## 2024-01-23 DIAGNOSIS — I10 Essential (primary) hypertension: Secondary | ICD-10-CM | POA: Diagnosis not present

## 2024-01-23 DIAGNOSIS — I420 Dilated cardiomyopathy: Secondary | ICD-10-CM

## 2024-01-23 DIAGNOSIS — N401 Enlarged prostate with lower urinary tract symptoms: Secondary | ICD-10-CM

## 2024-01-23 DIAGNOSIS — I5022 Chronic systolic (congestive) heart failure: Secondary | ICD-10-CM

## 2024-01-23 DIAGNOSIS — F0280 Dementia in other diseases classified elsewhere without behavioral disturbance: Secondary | ICD-10-CM

## 2024-01-23 DIAGNOSIS — N1831 Chronic kidney disease, stage 3a: Secondary | ICD-10-CM | POA: Diagnosis not present

## 2024-01-23 DIAGNOSIS — R2681 Unsteadiness on feet: Secondary | ICD-10-CM | POA: Diagnosis not present

## 2024-01-23 DIAGNOSIS — I480 Paroxysmal atrial fibrillation: Secondary | ICD-10-CM | POA: Diagnosis not present

## 2024-01-23 DIAGNOSIS — G301 Alzheimer's disease with late onset: Secondary | ICD-10-CM | POA: Diagnosis not present

## 2024-01-23 DIAGNOSIS — M109 Gout, unspecified: Secondary | ICD-10-CM

## 2024-01-23 MED ORDER — LOSARTAN POTASSIUM 25 MG PO TABS: 12.5000 mg | ORAL_TABLET | Freq: Every day | ORAL | 1 refills | Status: AC

## 2024-01-23 NOTE — Progress Notes (Signed)
 Name: Ronald Fleming   MRN: [id number]    DOB: [birth year]   Date:01/23/2024       Progress Note  Subjective  Chief Complaint  Chief Complaint  Patient presents with   Medical Management of Chronic Issues    Weight loss again, pt states this week his appetite been better   Discussed the use of AI scribe software for clinical note transcription with the patient, who gave verbal consent to proceed.  History of Present Illness Ronald Fleming is an 88 year old male with chronic systolic congestive heart failure and dilated cardiomyopathy who presents with unintentional weight loss.  He has experienced an unintentional weight loss of 8 pounds over the past six weeks, decreasing from 157 pounds to 149 pounds, his weight was 191 lbs in August showing significant weight loss in a short period of time . Despite this, he notes an improvement in appetite over the past week, since started on Megace given by Dr. Florencio .  He is under hospice and has an aid currently receiving assistance at home with daily activities.  He has chronic systolic congestive heart failure and dilated cardiomyopathy and paroxysmal Atrial fibrillation , experiencing shortness of breath, particularly with exertion during activities such as washing dishes and folding clothes, requiring frequent rest. His current medications for heart condition include losartan  25 mg , spironolactone (half a pill), Jardiance, dose of Pradaxa  recently reduced to 75 mg and started on Razadyne recently by cardiologist . He is no longer taking atorvastatin  - stopped by cardiologist .   No recent episodes of gout have occurred, and he continues to take allopurinol  twice daily. He denies any current prostate problems but continues to take tamsulosin .  He has chronic kidney disease, previously noted as stage 3A, with recent lab work indicating a decline to stage 3B. He is not experiencing symptoms related to his chronic deep venous thrombosis in the right  leg, such as swelling or pain. He uses a cane to assist with balance and prevent falls.  His family history is notable for a brother who recently passed away from multiple health issues. This loss has been emotionally challenging for him and his family.  He has a diagnosis of late-onset Alzheimer's disease. He is taking supplements for vitamin B12 and vitamin D  deficiencies. His last blood work showed normal white blood cell count and good kidney function, although his eGFR was low.    Patient Active Problem List   Diagnosis Date Noted   Gait instability 08/01/2021   Primary osteoarthritis of both knees 08/01/2021   DDD (degenerative disc disease), lumbosacral 09/11/2017   Nephrolithiasis 09/11/2017   Vitamin D  deficiency 09/11/2017   Lesion of pancreas 07/09/2017   Atherosclerosis of aorta 07/01/2017   Osteopenia 05/21/2017   Atrioventricular block, second degree 05/29/2016   Vitamin B12 deficiency 05/21/2016   Late onset Alzheimer's disease without behavioral disturbance (HCC) 05/21/2016   Vitiligo 02/12/2016   BPH (benign prostatic hyperplasia) 08/03/2015   Atrial fibrillation (HCC) 02/01/2015   Neuropathy 08/23/2014   Benign essential HTN 08/27/2006   Hypercholesterolemia without hypertriglyceridemia 08/27/2006    Past Surgical History:  Procedure Laterality Date   COLONOSCOPY     HERNIA REPAIR  11/09/2017   INGUINAL HERNIA REPAIR Right 11/10/2017   Procedure: LAPAROSCOPIC RIGHT  INGUINAL HERNIA REPAIR;  Surgeon: Rubin Calamity, MD;  Location: [facility] OR;  Service: General;  Laterality: Right;   INGUINAL HERNIA REPAIR Left 02/23/2020   Procedure: LAPAROSCOPIC LEFT  INGUINAL HERNIA WITH MESH;  Surgeon: Rubin Calamity, MD;  Location: [facility] OR;  Service: General;  Laterality: Left;   INSERTION OF MESH Right 11/10/2017   Procedure: INSERTION OF MESH;  Surgeon: Rubin Calamity, MD;  Location: [facility] OR;  Service: General;  Laterality: Right;    Family History  Problem Relation Age of  Onset   Coronary artery disease Father    Stroke Son    Kidney disease Neg Hx    Prostate cancer Neg Hx    Bladder Cancer Neg Hx     Social History   Tobacco Use   Smoking status: Never   Smokeless tobacco: Never  Substance Use Topics   Alcohol use: Never    Alcohol/week: 0.0 standard drinks of alcohol     Current Outpatient Medications:    allopurinol  (ZYLOPRIM ) 100 MG tablet, TAKE 1 TABLET TWICE DAILY, Disp: 180 tablet, Rfl: 3   carvedilol (COREG) 3.125 MG tablet, Take 3.125 mg by mouth., Disp: , Rfl:    Cholecalciferol (VITAMIN D ) 50 MCG (2000 UT) CAPS, Take 1 capsule (2,000 Units total) by mouth daily., Disp: 100 capsule, Rfl: 3   dabigatran  (PRADAXA ) 75 MG CAPS capsule, Take 75 mg by mouth 2 (two) times daily., Disp: , Rfl:    JARDIANCE 10 MG TABS tablet, Take 10 mg by mouth daily., Disp: , Rfl:    losartan  (COZAAR ) 25 MG tablet, Take 1 tablet (25 mg total) by mouth daily., Disp: 90 tablet, Rfl: 1   spironolactone (ALDACTONE) 25 MG tablet, Take 12.5 mg by mouth., Disp: , Rfl:    tamsulosin  (FLOMAX ) 0.4 MG CAPS capsule, Take 1 capsule (0.4 mg total) by mouth daily., Disp: 90 capsule, Rfl: 3   vitamin B-12 (CYANOCOBALAMIN ) 1000 MCG tablet, Take 2,000 mcg by mouth 3 (three) times a week. , Disp: , Rfl:    amiodarone  (PACERONE ) 200 MG tablet, Take 1 tablet (200 mg total) by mouth daily., Disp: 90 tablet, Rfl: 0   atorvastatin  (LIPITOR) 20 MG tablet, Take 1 tablet (20 mg total) by mouth daily., Disp: 90 tablet, Rfl: 3   dabigatran  (PRADAXA ) 150 MG CAPS capsule, Take 1 capsule (150 mg total) by mouth 2 (two) times daily., Disp: 180 capsule, Rfl: 1   furosemide  (LASIX ) 20 MG tablet, Take 1 tablet (20 mg total) by mouth daily as needed. For swelling and increase sob (Patient not taking: Reported on 12/01/2023), Disp: 90 tablet, Rfl: 0   potassium chloride  SA (KLOR-CON  M) 20 MEQ tablet, Take 1 tablet (20 mEq total) by mouth daily as needed. Only when you take furosemide  (Patient not  taking: Reported on 12/01/2023), Disp: 90 tablet, Rfl: 0  No Known Allergies  I personally reviewed active problem list, medication list, allergies, family history with the patient/caregiver today.   ROS  Ten systems reviewed and is negative except as mentioned in HPI    Objective Physical Exam  CONSTITUTIONAL: Patient appears well-developed and well-nourished.  No distress. HEENT: Head atraumatic, normocephalic, neck supple. CARDIOVASCULAR: Normal rate, regular rhythm and normal heart sounds.  No murmur heard. No BLE edema. PULMONARY: Effort normal and breath sounds normal. No respiratory distress. ABDOMINAL: There is no tenderness or distention. MUSCULOSKELETAL: uses a cane, slow gait  PSYCHIATRIC: Patient has a normal mood and affect. behavior is normal. Judgment and thought content normal.  Vitals:   01/23/24 1030  BP: 106/62  Pulse: 60  Resp: 16  SpO2: 95%  Weight: 149 lb 12.8 oz (67.9 kg)  Height: 5' 9 (1.753 m)    Body mass index is  22.12 kg/m.  Recent Results (from the past 2160 hours)  CBC w/Diff/Platelet     Status: Abnormal   Collection Time: 11/28/23 12:45 PM  Result Value Ref Range   WBC 10.6 (H) 4.0 - 10.5 K/uL   RBC 4.74 4.22 - 5.81 MIL/uL   Hemoglobin 15.2 13.0 - 17.0 g/dL   HCT 54.6 60.9 - 47.9 %   MCV 95.6 80.0 - 100.0 fL   MCH 32.1 26.0 - 34.0 pg   MCHC 33.6 30.0 - 36.0 g/dL   RDW 87.2 88.4 - 84.4 %   Platelets 152 150 - 400 K/uL   nRBC 0.0 0.0 - 0.2 %   Neutrophils Relative % 81 %   Neutro Abs 8.6 (H) 1.7 - 7.7 K/uL   Lymphocytes Relative 8 %   Lymphs Abs 0.9 0.7 - 4.0 K/uL   Monocytes Relative 9 %   Monocytes Absolute 0.9 0.1 - 1.0 K/uL   Eosinophils Relative 1 %   Eosinophils Absolute 0.1 0.0 - 0.5 K/uL   Basophils Relative 0 %   Basophils Absolute 0.0 0.0 - 0.1 K/uL   Immature Granulocytes 1 %   Abs Immature Granulocytes 0.06 0.00 - 0.07 K/uL    Comment: Performed at [facility], [address], [place], [place] [address]   Basic Metabolic Panel (BMET)     Status: Abnormal   Collection Time: 11/28/23 12:45 PM  Result Value Ref Range   Sodium 132 (L) 135 - 145 mmol/L   Potassium 4.4 3.5 - 5.1 mmol/L   Chloride 97 (L) 98 - 111 mmol/L   CO2 23 22 - 32 mmol/L   Glucose, Bld 110 (H) 70 - 99 mg/dL    Comment: Glucose reference range applies only to samples taken after fasting for at least 8 hours.   BUN 58 (H) 8 - 23 mg/dL   Creatinine, Ser 7.26 (H) 0.61 - 1.24 mg/dL   Calcium  9.3 8.9 - 10.3 mg/dL   GFR, Estimated 22 (L) >60 mL/min    Comment: (NOTE) Calculated using the CKD-EPI Creatinine Equation (2021)    Anion gap 12 5 - 15    Comment: Performed at [facility], [address] [place], [place] [address]  B Nat Peptide     Status: None   Collection Time: 11/28/23 12:45 PM  Result Value Ref Range   B Natriuretic Peptide 90.8 0.0 - 100.0 pg/mL    Comment: Performed at [facility], [address] [place], [place] [address]  Comprehensive metabolic panel with GFR     Status: Abnormal   Collection Time: 12/01/23 12:05 PM  Result Value Ref Range   Glucose, Bld 85 65 - 99 mg/dL    Comment: .            Fasting reference interval .    BUN 49 (H) 7 - 25 mg/dL   Creat 7.85 (H) 9.29 - 1.22 mg/dL   eGFR 29 (L) > OR = 60 mL/min/1.42m2   BUN/Creatinine Ratio 23 (H) 6 - 22 (calc)   Sodium 131 (L) 135 - 146 mmol/L   Potassium 5.0 3.5 - 5.3 mmol/L   Chloride 98 98 - 110 mmol/L   CO2 21 20 - 32 mmol/L   Calcium  9.4 8.6 - 10.3 mg/dL   Total Protein 7.0 6.1 - 8.1 g/dL   Albumin 3.9 3.6 - 5.1 g/dL   Globulin 3.1 1.9 - 3.7 g/dL (calc)   AG Ratio 1.3 1.0 - 2.5 (calc)   Total Bilirubin  0.8 0.2 - 1.2 mg/dL   Alkaline phosphatase (APISO) 99 35 - 144 U/L   AST 18 10 - 35 U/L   ALT 15 9 - 46 U/L  CBC with Differential/Platelet     Status: Abnormal   Collection Time: 12/01/23 12:05 PM  Result Value Ref Range   WBC 8.9 3.8 - 10.8 Thousand/uL   RBC 4.66 4.20 - 5.80 Million/uL   Hemoglobin  15.2 13.2 - 17.1 g/dL   HCT 53.4 61.4 - 49.9 %   MCV 99.8 80.0 - 100.0 fL   MCH 32.6 27.0 - 33.0 pg   MCHC 32.7 32.0 - 36.0 g/dL    Comment: For adults, a slight decrease in the calculated MCHC value (in the range of 30 to 32 g/dL) is most likely not clinically significant; however, it should be interpreted with caution in correlation with other red cell parameters and the patient's clinical condition.    RDW 13.0 11.0 - 15.0 %   Platelets 155 140 - 400 Thousand/uL   MPV 12.1 7.5 - 12.5 fL   Neutro Abs 7,173 1,500 - 7,800 cells/uL   Absolute Lymphocytes 819 (L) 850 - 3,900 cells/uL   Absolute Monocytes 757 200 - 950 cells/uL   Eosinophils Absolute 116 15 - 500 cells/uL   Basophils Absolute 36 0 - 200 cells/uL   Neutrophils Relative % 80.6 %   Total Lymphocyte 9.2 %   Monocytes Relative 8.5 %   Eosinophils Relative 1.3 %   Basophils Relative 0.4 %    Diabetic Foot Exam:     PHQ2/9:    01/23/2024   10:30 AM 12/01/2023   10:50 AM 10/17/2023    9:53 AM 06/13/2023   12:58 PM 02/12/2023    9:34 AM  Depression screen PHQ 2/9  Decreased Interest 0 0 0 0 0  Down, Depressed, Hopeless 0 0 0 0 0  PHQ - 2 Score 0 0 0 0 0  Altered sleeping    0 0  Tired, decreased energy    0 0  Change in appetite    0 0  Feeling bad or failure about yourself     0 0  Trouble concentrating    0 0  Moving slowly or fidgety/restless    0 0  Suicidal thoughts    0 0  PHQ-9 Score    0  0   Difficult doing work/chores    Not difficult at all      Data saved with a previous flowsheet row definition    phq 9 is negative  Fall Risk:    01/23/2024   10:29 AM 12/01/2023   10:50 AM 10/17/2023    9:53 AM 06/13/2023   12:58 PM 02/12/2023    9:34 AM  Fall Risk   Falls in the past year? 1 1 0 0 0  Number falls in past yr: 0 0 0 0 0  Injury with Fall? 0 0 0 0 0  Risk for fall due to : Impaired balance/gait Impaired balance/gait No Fall Risks No Fall Risks No Fall Risks  Follow up Falls evaluation  completed Falls evaluation completed Falls evaluation completed Falls prevention discussed;Education provided;Falls evaluation completed Falls prevention discussed      Assessment & Plan Unintentional weight loss and protein-calorie malnutrition Continued weight loss despite improved appetite. Declined further diagnostic workup. - Continue caregiver assistance for nutrition. - Encouraged eating when appetite is good.  Chronic systolic heart failure and dilated cardiomyopathy with paroxysmal atrial fibrillation Shortness of breath  and fatigue likely due to heart failure. Blood pressure low. Adjusted medications to manage symptoms and improve heart function and appetite. - Reduced losartan  to 12.5 mg daily. - Continue Pradaxa  75 mg. - Continue furosemide  as needed. - Continue Razen 2 mg for heart function. - Continue Magace 3 mg to improve appetite.  Chronic kidney disease, stage 3a to 3b Progression from stage 3a to 3b. Low eGFR possibly due to inadequate nutrition. - Ordered blood work to check kidney function. - Monitor eGFR levels.  Chronic embolism and thrombosis of right femoral vein Chronic clot with no current symptoms. - Continue monitoring for symptoms of swelling or pain.  Benign prostatic hyperplasia with lower urinary tract symptoms BPH well-controlled with tamsulosin . - Continue tamsulosin .  Gout No recent attacks. Preventive treatment with allopurinol . - Continue allopurinol  twice daily.  Unsteadiness on feet Uses a cane for stability.  Alzheimer's disease with late onset, very mild Very mild with good memory function. Stability improving.  Vitamin B12 and vitamin D  deficiency Deficiencies managed with supplements. - Continue vitamin B12 and D supplements.

## 2024-01-24 LAB — BASIC METABOLIC PANEL WITH GFR
BUN/Creatinine Ratio: 25 — ABNORMAL HIGH (ref 10–24)
BUN: 44 mg/dL — ABNORMAL HIGH (ref 8–27)
CO2: 18 mmol/L — ABNORMAL LOW (ref 20–29)
Calcium: 9.2 mg/dL (ref 8.6–10.2)
Chloride: 101 mmol/L (ref 96–106)
Creatinine, Ser: 1.74 mg/dL — ABNORMAL HIGH (ref 0.76–1.27)
Glucose: 88 mg/dL (ref 70–99)
Potassium: 4.7 mmol/L (ref 3.5–5.2)
Sodium: 134 mmol/L (ref 134–144)
eGFR: 38 mL/min/1.73 — ABNORMAL LOW (ref >59)

## 2024-01-24 LAB — URIC ACID: Uric Acid: 4 mg/dL (ref 3.8–8.4)

## 2024-01-26 ENCOUNTER — Ambulatory Visit: Payer: Self-pay | Admitting: Family Medicine

## 2024-02-06 ENCOUNTER — Telehealth: Payer: Self-pay

## 2024-02-06 ENCOUNTER — Ambulatory Visit (INDEPENDENT_AMBULATORY_CARE_PROVIDER_SITE_OTHER)

## 2024-02-06 VITALS — BP 106/62 | Ht 69.0 in | Wt 149.0 lb

## 2024-02-06 DIAGNOSIS — Z0000 Encounter for general adult medical examination without abnormal findings: Secondary | ICD-10-CM

## 2024-02-06 MED ORDER — JARDIANCE 10 MG PO TABS: 10.0000 mg | ORAL_TABLET | Freq: Every day | ORAL | 1 refills | Status: AC

## 2024-02-06 NOTE — Progress Notes (Signed)
 I connected with  Ronald Fleming Comes on 02/06/24 by a audio enabled telemedicine application and verified that I am speaking with the correct person using two identifiers.  Patient Location: Home  Provider Location: Home Office  Persons Participating in Visit: Patient.  I discussed the limitations of evaluation and management by telemedicine. The patient expressed understanding and agreed to proceed.  Vital Signs: Because this visit was a virtual/telehealth visit, some criteria may be missing or patient reported. Any vitals not documented were not able to be obtained and vitals that have been documented are patient reported.   This visit was performed by a medical professional under my direct supervision. I was immediately available for consultation/collaboration. I have reviewed and agree with the Annual Wellness Visit documentation.   Because this visit was a virtual/telehealth visit,  certain criteria was not obtained, such a blood pressure, CBG if applicable, and timed get up and go. Any medications not marked as taking were not mentioned during the medication reconciliation part of the visit. Any vitals not documented were not able to be obtained due to this being a telehealth visit or patient was unable to self-report a recent blood pressure reading due to a lack of equipment at home via telehealth. Vitals that have been documented are verbally provided by the patient.  Chief Complaint  Patient presents with   Medicare Wellness     Subjective:   Ronald Fleming is a 88 y.o. male who presents for a Medicare Annual Wellness Visit.  Allergies (verified) Patient has no known allergies.   History: Past Medical History:  Diagnosis Date   Arrhythmia    Arthritis    Benign positional vertigo    BPH (benign prostatic hyperplasia)    Chronic kidney disease, stage III (moderate) (HCC)    Deep vein blood clot of right lower extremity (HCC)    Dementia (HCC)    DVT (deep venous  thrombosis) (HCC)    Dysrhythmia    ApFib   Glaucoma    Hyperlipidemia    Hypertension    Nephrolithiasis    Neuropathy    Past Surgical History:  Procedure Laterality Date   COLONOSCOPY     HERNIA REPAIR  11/09/2017   INGUINAL HERNIA REPAIR Right 11/10/2017   Procedure: LAPAROSCOPIC RIGHT  INGUINAL HERNIA REPAIR;  Surgeon: Rubin Calamity, MD;  Location: [facility] OR;  Service: General;  Laterality: Right;   INGUINAL HERNIA REPAIR Left 02/23/2020   Procedure: LAPAROSCOPIC LEFT  INGUINAL HERNIA WITH MESH;  Surgeon: Rubin Calamity, MD;  Location: [facility] OR;  Service: General;  Laterality: Left;   INSERTION OF MESH Right 11/10/2017   Procedure: INSERTION OF MESH;  Surgeon: Rubin Calamity, MD;  Location: [facility] OR;  Service: General;  Laterality: Right;   Family History  Problem Relation Age of Onset   Coronary artery disease Father    Stroke Son    Multiple myeloma Son    Kidney disease Neg Hx    Prostate cancer Neg Hx    Bladder Cancer Neg Hx    Social History   Occupational History   Occupation: retired    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   Tobacco Use   Smoking status: Never   Smokeless tobacco: Never  Vaping Use   Vaping status: Never Used  Substance and Sexual Activity   Alcohol use: Never    Alcohol/week: 0.0 standard drinks of alcohol   Drug use: No   Sexual activity: Not Currently    Partners:  Female   Tobacco Counseling Counseling given: Not Answered  SDOH Screenings   Food Insecurity: No Food Insecurity (02/05/2024)  Housing: Low Risk  (02/05/2024)  Transportation Needs: No Transportation Needs (02/05/2024)  Utilities: Not At Risk (02/06/2024)  Alcohol Screen: Low Risk  (10/17/2022)  Depression (PHQ2-9): Low Risk  (01/23/2024)  Financial Resource Strain: Low Risk  (02/05/2024)  Physical Activity: Patient Declined (02/05/2024)  Social Connections: Moderately Integrated (02/05/2024)  Stress: No Stress Concern Present (02/05/2024)   Tobacco Use: Low Risk  (02/06/2024)  Health Literacy: Adequate Health Literacy (02/06/2024)   See flowsheets for full screening details  Depression Screen PHQ 2 & 9 Depression Scale- Over the past 2 weeks, how often have you been bothered by any of the following problems? Little interest or pleasure in doing things: 0 Feeling down, depressed, or hopeless (PHQ Adolescent also includes...irritable): 0 PHQ-2 Total Score: 0 Trouble falling or staying asleep, or sleeping too much: 0 Feeling tired or having little energy: 0 Poor appetite or overeating (PHQ Adolescent also includes...weight loss): 0 Feeling bad about yourself - or that you are a failure or have let yourself or your family down: 0 Trouble concentrating on things, such as reading the newspaper or watching television (PHQ Adolescent also includes...like school work): 0 Moving or speaking so slowly that other people could have noticed. Or the opposite - being so fidgety or restless that you have been moving around a lot more than usual: 0 Thoughts that you would be better off dead, or of hurting yourself in some way: 0 PHQ-9 Total Score: 0 If you checked off any problems, how difficult have these problems made it for you to do your work, take care of things at home, or get along with other people?: Not difficult at all     Goals Addressed               This Visit's Progress     Patient Stated (pt-stated)        To get get well . Get weight back        Visit info / Clinical Intake: Medicare Wellness Visit Type:: Subsequent Annual Wellness Visit Persons participating in visit:: patient Medicare Wellness Visit Mode:: Telephone If telephone:: video declined Because this visit was a virtual/telehealth visit:: pt reported vitals If Telephone or Video please confirm:: I connected with the patient using audio enabled telemedicine application and verified that I am speaking with the correct person using two identifiers; I  discussed the limitations of evaluation and management by telemedicine; The patient expressed understanding and agreed to proceed Patient Location:: home Provider Location:: home office Information given by:: patient Interpreter Needed?: No Pre-visit prep was completed: yes AWV questionnaire completed by patient prior to visit?: yes Date:: 02/05/24 Living arrangements:: lives with spouse/significant other Patient's Overall Health Status Rating: very good Typical amount of pain: none Does pain affect daily life?: no Are you currently prescribed opioids?: no  Dietary Habits and Nutritional Risks How many meals a day?: 2 Eats fruit and vegetables daily?: yes Most meals are obtained by: preparing own meals In the last 2 weeks, have you had any of the following?: unintentional weight loss Diabetic:: no  Functional Status Activities of Daily Living (to include ambulation/medication): (Patient-Rptd) Independent Ambulation: Independent with device- listed below Home Assistive Devices/Equipment: Cane Medication Administration: Independent Home Management: (Patient-Rptd) Needs assistance (comment) Primary transportation is: family/friends Concerns about vision?: no *vision screening is required for WTM* Concerns about hearing?: no  Fall Screening Falls in the past  year?: (Patient-Rptd) 1 Number of falls in past year: (Patient-Rptd) 1 Was there an injury with Fall?: (Patient-Rptd) 0 Fall Risk Category Calculator: (Patient-Rptd) 2 Patient Fall Risk Level: (Patient-Rptd) Moderate Fall Risk  Fall Risk Patient at Risk for Falls Due to: History of fall(s); Impaired mobility Fall risk Follow up: Falls evaluation completed; Education provided  Home and Transportation Safety: All rugs have non-skid backing?: N/A, no rugs All stairs or steps have railings?: N/A, no stairs Grab bars in the bathtub or shower?: yes Have non-skid surface in bathtub or shower?: yes Good home lighting?:  yes Regular seat belt use?: yes Hospital stays in the last year:: (!) yes How many hospital stays:: 1 Reason: leg swelling  Cognitive Assessment Difficulty concentrating, remembering, or making decisions? : no Will 6CIT or Mini Cog be Completed: no 6CIT or Mini Cog Declined: patient declined  Advance Directives (For Healthcare) Does Patient Have a Medical Advance Directive?: Yes Does patient want to make changes to medical advance directive?: No - Patient declined Type of Advance Directive: Healthcare Power of [place]; Living will Copy of Healthcare Power of Attorney in Chart?: No - copy requested Copy of Living Will in Chart?: No - copy requested  Reviewed/Updated  Reviewed/Updated: Reviewed All (Medical, Surgical, Family, Medications, Allergies, Care Teams, Patient Goals)        Objective:    Today's Vitals   02/06/24 0902  BP: 106/62  Weight: 149 lb (67.6 kg)  Height: 5' 9 (1.753 m)   Body mass index is 22 kg/m.  Current Medications (verified) Outpatient Encounter Medications as of 02/06/2024  Medication Sig   allopurinol  (ZYLOPRIM ) 100 MG tablet TAKE 1 TABLET TWICE DAILY   carvedilol (COREG) 3.125 MG tablet Take 3.125 mg by mouth.   Cholecalciferol (VITAMIN D ) 50 MCG (2000 UT) CAPS Take 1 capsule (2,000 Units total) by mouth daily.   dabigatran  (PRADAXA ) 75 MG CAPS capsule Take 75 mg by mouth 2 (two) times daily.   galantamine (RAZADYNE) 12 MG tablet Take 12 mg by mouth 2 (two) times daily.   JARDIANCE  10 MG TABS tablet Take 10 mg by mouth daily.   losartan  (COZAAR ) 25 MG tablet Take 0.5 tablets (12.5 mg total) by mouth daily.   megestrol (MEGACE) 20 MG tablet Take 20 mg by mouth daily.   spironolactone (ALDACTONE) 25 MG tablet Take 12.5 mg by mouth.   tamsulosin  (FLOMAX ) 0.4 MG CAPS capsule Take 1 capsule (0.4 mg total) by mouth daily.   vitamin B-12 (CYANOCOBALAMIN ) 1000 MCG tablet Take 2,000 mcg by mouth 3 (three) times a week.    amiodarone  (PACERONE ) 200 MG  tablet Take 1 tablet (200 mg total) by mouth daily. (Patient not taking: Reported on 02/06/2024)   furosemide  (LASIX ) 20 MG tablet Take 1 tablet (20 mg total) by mouth daily as needed. For swelling and increase sob (Patient not taking: Reported on 12/01/2023)   potassium chloride  SA (KLOR-CON  M) 20 MEQ tablet Take 1 tablet (20 mEq total) by mouth daily as needed. Only when you take furosemide  (Patient not taking: Reported on 12/01/2023)   No facility-administered encounter medications on file as of 02/06/2024.   Hearing/Vision screen Hearing Screening - Comments:: No difficulties  Vision Screening - Comments:: Patient wears readers Immunizations and Health Maintenance Health Maintenance  Topic Date Due   Zoster Vaccines- Shingrix  (1 of 2) 11/09/1987   COVID-19 Vaccine (7 - 2025-26 season) 11/17/2023   DTaP/Tdap/Td (3 - Td or Tdap) 08/02/2031   Pneumococcal Vaccine: 50+ Years  Completed  Influenza Vaccine  Completed   Meningococcal B Vaccine  Aged Out        Assessment/Plan:  This is a routine wellness examination for Bracey.  Patient Care Team: Sowles, Krichna, MD as PCP - General (Family Medicine) Cindie Ole DASEN, MD as PCP - Electrophysiology (Cardiology) Florencio Cara BIRCH, MD as Consulting Physician (Cardiology) Maree Jannett POUR, MD as Consulting Physician (Neurology) Cam Morene ORN, MD as Attending Physician (Urology)  I have personally reviewed and noted the following in the patient's chart:   Medical and social history Use of alcohol, tobacco or illicit drugs  Current medications and supplements including opioid prescriptions. Functional ability and status Nutritional status Physical activity Advanced directives List of other physicians Hospitalizations, surgeries, and ER visits in previous 12 months Vitals Screenings to include cognitive, depression, and falls Referrals and appointments  No orders of the defined types were placed in this encounter.  In  addition, I have reviewed and discussed with patient certain preventive protocols, quality metrics, and best practice recommendations. A written personalized care plan for preventive services as well as general preventive health recommendations were provided to patient.   Lyle POUR Right, [place]   02/06/2024   No follow-ups on file.  After Visit Summary: (MyChart) Due to this being a telephonic visit, the after visit summary with patients personalized plan was offered to patient via MyChart   Nurse Notes: nothing to report

## 2024-02-06 NOTE — Patient Instructions (Signed)
 Mr. Ronald Fleming,  Thank you for taking the time for your Medicare Wellness Visit. I appreciate your continued commitment to your health goals. Please review the care plan we discussed, and feel free to reach out if I can assist you further.  Please note that Annual Wellness Visits do not include a physical exam. Some assessments may be limited, especially if the visit was conducted virtually. If needed, we may recommend an in-person follow-up with your provider.  Ongoing Care Seeing your primary care provider every 3 to 6 months helps us  monitor your health and provide consistent, personalized care.   Referrals If a referral was made during today's visit and you haven't received any updates within two weeks, please contact the referred provider directly to check on the status.  Recommended Screenings:  Health Maintenance  Topic Date Due   Zoster (Shingles) Vaccine (1 of 2) 11/09/1987   COVID-19 Vaccine (7 - 2025-26 season) 11/17/2023   DTaP/Tdap/Td vaccine (3 - Td or Tdap) 08/02/2031   Pneumococcal Vaccine for age over 91  Completed   Flu Shot  Completed   Meningitis B Vaccine  Aged Out       02/05/2024    9:19 AM  Advanced Directives  Does Patient Have a Medical Advance Directive? Yes  Type of Estate Agent of [place];Living will  Does patient want to make changes to medical advance directive? No - Patient declined  Copy of Healthcare Power of Attorney in Chart? No - copy requested    Vision: Annual vision screenings are recommended for early detection of glaucoma, cataracts, and diabetic retinopathy. These exams can also reveal signs of chronic conditions such as diabetes and high blood pressure.  Dental: Annual dental screenings help detect early signs of oral cancer, gum disease, and other conditions linked to overall health, including heart disease and diabetes.  Please see the attached documents for additional preventive care recommendations.

## 2024-02-06 NOTE — Progress Notes (Signed)
 Pharmacy Quality Measure Review  This patient is appearing on a report for being at risk of failing the adherence measure for diabetes medications this calendar year, however, this medication is used for CHF for this patient.   Medication: Jardiance  Last fill date: 12/21/23 for 30 day supply Medication was started during admission to [facility] from 8/6-8/10/25. No refills left on file.  Will collaborate with provider to facilitate refill needs.   Wandalene Abrams E. Marsh, PharmD, CPP Clinical Pharmacist [facility] [phone]

## 2024-02-16 DIAGNOSIS — N1832 Chronic kidney disease, stage 3b: Secondary | ICD-10-CM | POA: Diagnosis not present

## 2024-02-16 DIAGNOSIS — G309 Alzheimer's disease, unspecified: Secondary | ICD-10-CM | POA: Diagnosis not present

## 2024-02-16 DIAGNOSIS — M109 Gout, unspecified: Secondary | ICD-10-CM | POA: Diagnosis not present

## 2024-02-16 DIAGNOSIS — Z1159 Encounter for screening for other viral diseases: Secondary | ICD-10-CM | POA: Diagnosis not present

## 2024-02-16 DIAGNOSIS — I4891 Unspecified atrial fibrillation: Secondary | ICD-10-CM | POA: Diagnosis not present

## 2024-02-16 DIAGNOSIS — E785 Hyperlipidemia, unspecified: Secondary | ICD-10-CM | POA: Diagnosis not present

## 2024-02-16 DIAGNOSIS — F0280 Dementia in other diseases classified elsewhere without behavioral disturbance: Secondary | ICD-10-CM | POA: Diagnosis not present

## 2024-02-16 DIAGNOSIS — N400 Enlarged prostate without lower urinary tract symptoms: Secondary | ICD-10-CM | POA: Diagnosis not present

## 2024-02-16 DIAGNOSIS — I5020 Unspecified systolic (congestive) heart failure: Secondary | ICD-10-CM | POA: Diagnosis not present

## 2024-02-17 ENCOUNTER — Ambulatory Visit: Admitting: Family Medicine

## 2024-02-18 NOTE — Progress Notes (Signed)
 Pharmacy Quality Measure Review  This patient is appearing on a report for being at risk of failing the adherence measure for diabetes medications this calendar year.   Medication: Jardiance  Last fill date: 02/06/24 for 90 day supply  Insurance report was not up to date. No action needed at this time.   Rhyder Bratz E. Marsh, PharmD, CPP Clinical Pharmacist [facility] [phone]

## 2024-02-27 ENCOUNTER — Ambulatory Visit: Admitting: Family Medicine

## 2024-02-27 ENCOUNTER — Encounter: Payer: Self-pay | Admitting: Family Medicine

## 2024-02-27 VITALS — BP 114/64 | HR 64 | Temp 97.8°F | Resp 18 | Ht 69.0 in | Wt 154.7 lb

## 2024-02-27 DIAGNOSIS — R2681 Unsteadiness on feet: Secondary | ICD-10-CM

## 2024-02-27 DIAGNOSIS — I5022 Chronic systolic (congestive) heart failure: Secondary | ICD-10-CM | POA: Diagnosis not present

## 2024-02-27 DIAGNOSIS — R634 Abnormal weight loss: Secondary | ICD-10-CM

## 2024-02-27 NOTE — Progress Notes (Signed)
 Name: Ronald Fleming   MRN: [id number]    DOB: 28-Nov-1935   Date:02/27/2024       Progress Note  Subjective  Chief Complaint  Chief Complaint  Patient presents with   Medical Management of Chronic Issues   Discussed the use of AI scribe software for clinical note transcription with the patient, who gave verbal consent to proceed.  History of Present Illness Ronald Fleming is an 88 year old male with chronic systolic congestive heart failure who presents for a follow-up visit. He is accompanied by his family members.  He recently had a follow-up with his cardiologist, where adjustments were made to his medications. His spironolactone dose was reduced due to low blood pressure, and his amiodarone  dose was corrected to 100 mg. He also takes losartan  at a reduced dose of 12.5 mg. He has not been taking Lasix  regularly but has it available for use as needed. His heart rate has improved to 61 bpm, and his blood pressure at home is monitored by [facility], with recent readings around 114/64 mmHg.  He has experienced weight gain, now weighing 154 lbs, up from 149 lbs a month ago. He attributes this to an increased appetite, possibly due to a medication given to stimulate appetite. No shortness of breath, chest pain, or palpitations. He feels more alert and better overall.  He continues to use a cane for stability but has been observed walking without it. He engages in daily activities such as making the bed, washing dishes, and cooking, indicating a good level of activity at home.    Patient Active Problem List   Diagnosis Date Noted   Gait instability 08/01/2021   Primary osteoarthritis of both knees 08/01/2021   DDD (degenerative disc disease), lumbosacral 09/11/2017   Nephrolithiasis 09/11/2017   Vitamin D  deficiency 09/11/2017   Lesion of pancreas 07/09/2017   Atherosclerosis of aorta 07/01/2017   Osteopenia 05/21/2017   Atrioventricular block, second degree 05/29/2016   Vitamin B12  deficiency 05/21/2016   Late onset Alzheimer's disease without behavioral disturbance (HCC) 05/21/2016   Vitiligo 02/12/2016   BPH (benign prostatic hyperplasia) 08/03/2015   Atrial fibrillation (HCC) 02/01/2015   Neuropathy 08/23/2014   Benign essential HTN 08/27/2006   Hypercholesterolemia without hypertriglyceridemia 08/27/2006    Past Surgical History:  Procedure Laterality Date   COLONOSCOPY     HERNIA REPAIR  11/09/2017   INGUINAL HERNIA REPAIR Right 11/10/2017   Procedure: LAPAROSCOPIC RIGHT  INGUINAL HERNIA REPAIR;  Surgeon: Rubin Calamity, MD;  Location: [facility] OR;  Service: General;  Laterality: Right;   INGUINAL HERNIA REPAIR Left 02/23/2020   Procedure: LAPAROSCOPIC LEFT  INGUINAL HERNIA WITH MESH;  Surgeon: Rubin Calamity, MD;  Location: [facility] OR;  Service: General;  Laterality: Left;   INSERTION OF MESH Right 11/10/2017   Procedure: INSERTION OF MESH;  Surgeon: Rubin Calamity, MD;  Location: [facility] OR;  Service: General;  Laterality: Right;    Family History  Problem Relation Age of Onset   Coronary artery disease Father    Stroke Son    Multiple myeloma Son    Kidney disease Neg Hx    Prostate cancer Neg Hx    Bladder Cancer Neg Hx     Social History   Tobacco Use   Smoking status: Never   Smokeless tobacco: Never  Substance Use Topics   Alcohol use: Never    Alcohol/week: 0.0 standard drinks of alcohol    Current Medications[1]  Allergies[2]  I personally reviewed active problem list, medication list, allergies,  family history with the patient/caregiver today.   ROS  Ten systems reviewed and is negative except as mentioned in HPI    Objective Physical Exam  CONSTITUTIONAL: Patient appears frail  HEENT: Head atraumatic, normocephalic, neck supple. CARDIOVASCULAR: Normal rate, regular rhythm and normal heart sounds. No murmur heard. No BLE edema. PULMONARY: Effort normal and breath sounds normal. No respiratory distress. ABDOMINAL: Abdomen normal.  There is no tenderness or distention. MUSCULOSKELETAL: using a cane  PSYCHIATRIC: Patient has a normal mood and affect. Behavior is normal.   Vitals:   02/27/24 1115  BP: 114/64  Pulse: 64  Resp: 18  Temp: 97.8 F (36.6 C)  SpO2: 97%  Weight: 154 lb 11.2 oz (70.2 kg)  Height: 5' 9 (1.753 m)    Body mass index is 22.85 kg/m.  Recent Results (from the past 2160 hours)  Comprehensive metabolic panel with GFR     Status: Abnormal   Collection Time: 12/01/23 12:05 PM  Result Value Ref Range   Glucose, Bld 85 65 - 99 mg/dL    Comment: .            Fasting reference interval .    BUN 49 (H) 7 - 25 mg/dL   Creat 7.85 (H) 9.29 - 1.22 mg/dL   eGFR 29 (L) > OR = 60 mL/min/1.86m2   BUN/Creatinine Ratio 23 (H) 6 - 22 (calc)   Sodium 131 (L) 135 - 146 mmol/L   Potassium 5.0 3.5 - 5.3 mmol/L   Chloride 98 98 - 110 mmol/L   CO2 21 20 - 32 mmol/L   Calcium  9.4 8.6 - 10.3 mg/dL   Total Protein 7.0 6.1 - 8.1 g/dL   Albumin 3.9 3.6 - 5.1 g/dL   Globulin 3.1 1.9 - 3.7 g/dL (calc)   AG Ratio 1.3 1.0 - 2.5 (calc)   Total Bilirubin 0.8 0.2 - 1.2 mg/dL   Alkaline phosphatase (APISO) 99 35 - 144 U/L   AST 18 10 - 35 U/L   ALT 15 9 - 46 U/L  CBC with Differential/Platelet     Status: Abnormal   Collection Time: 12/01/23 12:05 PM  Result Value Ref Range   WBC 8.9 3.8 - 10.8 Thousand/uL   RBC 4.66 4.20 - 5.80 Million/uL   Hemoglobin 15.2 13.2 - 17.1 g/dL   HCT 53.4 61.4 - 49.9 %   MCV 99.8 80.0 - 100.0 fL   MCH 32.6 27.0 - 33.0 pg   MCHC 32.7 32.0 - 36.0 g/dL    Comment: For adults, a slight decrease in the calculated MCHC value (in the range of 30 to 32 g/dL) is most likely not clinically significant; however, it should be interpreted with caution in correlation with other red cell parameters and the patient's clinical condition.    RDW 13.0 11.0 - 15.0 %   Platelets 155 140 - 400 Thousand/uL   MPV 12.1 7.5 - 12.5 fL   Neutro Abs 7,173 1,500 - 7,800 cells/uL   Absolute  Lymphocytes 819 (L) 850 - 3,900 cells/uL   Absolute Monocytes 757 200 - 950 cells/uL   Eosinophils Absolute 116 15 - 500 cells/uL   Basophils Absolute 36 0 - 200 cells/uL   Neutrophils Relative % 80.6 %   Total Lymphocyte 9.2 %   Monocytes Relative 8.5 %   Eosinophils Relative 1.3 %   Basophils Relative 0.4 %  Basic metabolic panel with GFR     Status: Abnormal   Collection Time: 01/23/24 11:58 AM  Result Value Ref  Range   Glucose 88 70 - 99 mg/dL   BUN 44 (H) 8 - 27 mg/dL   Creatinine, Ser 8.25 (H) 0.76 - 1.27 mg/dL   eGFR 38 (L) >40 fO/fpw/8.26   BUN/Creatinine Ratio 25 (H) 10 - 24   Sodium 134 134 - 144 mmol/L   Potassium 4.7 3.5 - 5.2 mmol/L   Chloride 101 96 - 106 mmol/L   CO2 18 (L) 20 - 29 mmol/L   Calcium  9.2 8.6 - 10.2 mg/dL  Uric acid     Status: None   Collection Time: 01/23/24 12:01 PM  Result Value Ref Range   Uric Acid 4.0 3.8 - 8.4 mg/dL    Comment:            Therapeutic target for gout patients: <6.0      PHQ2/9:    02/27/2024   11:06 AM 01/23/2024   10:30 AM 12/01/2023   10:50 AM 10/17/2023    9:53 AM 06/13/2023   12:58 PM  Depression screen PHQ 2/9  Decreased Interest 0 0 0 0 0  Down, Depressed, Hopeless 0 0 0 0 0  PHQ - 2 Score 0 0 0 0 0  Altered sleeping     0  Tired, decreased energy     0  Change in appetite     0  Feeling bad or failure about yourself      0  Trouble concentrating     0  Moving slowly or fidgety/restless     0  Suicidal thoughts     0  PHQ-9 Score     0   Difficult doing work/chores     Not difficult at all     Data saved with a previous flowsheet row definition    phq 9 is negative  Fall Risk:    02/27/2024   11:06 AM 02/05/2024    9:19 AM 01/23/2024   10:29 AM 12/01/2023   10:50 AM 10/17/2023    9:53 AM  Fall Risk   Falls in the past year? 1 1  1 1  0  Number falls in past yr: 0 1  0 0 0  Injury with Fall? 0 0   0  0  0   Risk for fall due to : Impaired balance/gait;History of fall(s) History of fall(s);Impaired  mobility Impaired balance/gait Impaired balance/gait No Fall Risks  Follow up Falls evaluation completed Falls evaluation completed;Education provided Falls evaluation completed Falls evaluation completed Falls evaluation completed     Manually entered by patient   Data saved with a previous flowsheet row definition      Assessment & Plan Chronic systolic congestive heart failure Managed with spironolactone, amiodarone , and losartan . Recent dose adjustments improved blood pressure and stabilized heart rate. - Continue spironolactone, amiodarone , and losartan  at adjusted doses. - Use Lasix  as needed for fluid accumulation. - Monitor blood pressure regularly. - Encouraged dietary intake to maintain weight.  Gait instability Balance improved with higher blood pressure. Uses a cane but has walked without it recently. - Encouraged walking two to three times daily. - Instructed to monitor balance and use cane as needed.  Unintentional weight loss Weight increased from 149 lbs to 154 lbs with mannose and improved eating habits. - Continue mannose to stimulate appetite. - Encouraged regular meals and snacks to maintain weight.        [1]  Current Outpatient Medications:    allopurinol  (ZYLOPRIM ) 100 MG tablet, TAKE 1 TABLET TWICE DAILY, Disp: 180 tablet, Rfl: 3  amiodarone  (PACERONE ) 200 MG tablet, Take 1 tablet (200 mg total) by mouth daily. (Patient taking differently: Take 100 mg by mouth daily.), Disp: 90 tablet, Rfl: 0   carvedilol (COREG) 3.125 MG tablet, Take 3.125 mg by mouth., Disp: , Rfl:    Cholecalciferol (VITAMIN D ) 50 MCG (2000 UT) CAPS, Take 1 capsule (2,000 Units total) by mouth daily., Disp: 100 capsule, Rfl: 3   dabigatran  (PRADAXA ) 75 MG CAPS capsule, Take 75 mg by mouth 2 (two) times daily., Disp: , Rfl:    galantamine (RAZADYNE) 12 MG tablet, Take 12 mg by mouth 2 (two) times daily., Disp: , Rfl:    JARDIANCE  10 MG TABS tablet, Take 1 tablet (10 mg total) by mouth  daily., Disp: 90 tablet, Rfl: 1   losartan  (COZAAR ) 25 MG tablet, Take 0.5 tablets (12.5 mg total) by mouth daily., Disp: 45 tablet, Rfl: 1   megestrol (MEGACE) 20 MG tablet, Take 20 mg by mouth daily., Disp: , Rfl:    spironolactone (ALDACTONE) 25 MG tablet, Take 12.5 mg by mouth., Disp: , Rfl:    tamsulosin  (FLOMAX ) 0.4 MG CAPS capsule, Take 1 capsule (0.4 mg total) by mouth daily., Disp: 90 capsule, Rfl: 3   vitamin B-12 (CYANOCOBALAMIN ) 1000 MCG tablet, Take 2,000 mcg by mouth 3 (three) times a week. , Disp: , Rfl:    furosemide  (LASIX ) 20 MG tablet, Take 1 tablet (20 mg total) by mouth daily as needed. For swelling and increase sob (Patient not taking: Reported on 12/01/2023), Disp: 90 tablet, Rfl: 0   potassium chloride  SA (KLOR-CON  M) 20 MEQ tablet, Take 1 tablet (20 mEq total) by mouth daily as needed. Only when you take furosemide  (Patient not taking: Reported on 12/01/2023), Disp: 90 tablet, Rfl: 0 [2] No Known Allergies

## 2024-03-03 ENCOUNTER — Other Ambulatory Visit: Payer: Self-pay | Admitting: Internal Medicine

## 2024-03-03 DIAGNOSIS — R001 Bradycardia, unspecified: Secondary | ICD-10-CM

## 2024-03-05 ENCOUNTER — Other Ambulatory Visit: Payer: Self-pay | Admitting: Family Medicine

## 2024-03-05 DIAGNOSIS — M109 Gout, unspecified: Secondary | ICD-10-CM

## 2024-03-09 NOTE — Telephone Encounter (Signed)
 Too soon for refill.  Requested Prescriptions  Pending Prescriptions Disp Refills   allopurinol  (ZYLOPRIM ) 100 MG tablet [Pharmacy Med Name: ALLOPURINOL  100 MG Oral Tablet] 180 tablet 3    Sig: TAKE 1 TABLET TWICE DAILY     Endocrinology:  Gout Agents - allopurinol  Failed - 03/09/2024 10:56 AM      Failed - Cr in normal range and within 360 days    Creat  Date Value Ref Range Status  12/01/2023 2.14 (H) 0.70 - 1.22 mg/dL Final   Creatinine, Ser  Date Value Ref Range Status  01/23/2024 1.74 (H) 0.76 - 1.27 mg/dL Final   Creatinine, Urine  Date Value Ref Range Status  10/19/2021 152 20 - 320 mg/dL Final         Passed - Uric Acid in normal range and within 360 days    Uric Acid  Date Value Ref Range Status  01/23/2024 4.0 3.8 - 8.4 mg/dL Final    Comment:               Therapeutic target for gout patients: <6.0         Passed - Valid encounter within last 12 months    Recent Outpatient Visits           1 week ago Chronic systolic congestive heart failure [facility])   [facility] Sowles, Krichna, MD   1 month ago Late onset Alzheimer's disease without behavioral disturbance [facility])   [facility] [name], MD   3 months ago Dehydration   [facility] [name], MD   3 months ago Chronic systolic congestive heart failure [facility])   [facility] Bernardo Fend, DO   4 months ago Atherosclerosis of aorta   [facility] [name], MD              Passed - CBC within normal limits and completed in the last 12 months    WBC  Date Value Ref Range Status  12/01/2023 8.9 3.8 - 10.8 Thousand/uL Final   RBC  Date Value Ref Range Status  12/01/2023 4.66 4.20 - 5.80 Million/uL Final   Hemoglobin  Date Value Ref Range Status  12/01/2023 15.2 13.2 - 17.1 g/dL Final   HCT  Date Value Ref Range Status  12/01/2023 46.5 38.5 - 50.0  % Final   MCHC  Date Value Ref Range Status  12/01/2023 32.7 32.0 - 36.0 g/dL Final    Comment:    For adults, a slight decrease in the calculated MCHC value (in the range of 30 to 32 g/dL) is most likely not clinically significant; however, it should be interpreted with caution in correlation with other red cell parameters and the patient's clinical condition.    [facility]  Date Value Ref Range Status  12/01/2023 32.6 27.0 - 33.0 pg Final   MCV  Date Value Ref Range Status  12/01/2023 99.8 80.0 - 100.0 fL Final   No results found for: PLTCOUNTKUC, LABPLAT, POCPLA RDW  Date Value Ref Range Status  12/01/2023 13.0 11.0 - 15.0 % Final

## 2024-03-16 ENCOUNTER — Ambulatory Visit: Admitting: Family Medicine

## 2024-03-30 ENCOUNTER — Encounter (HOSPITAL_COMMUNITY): Payer: Self-pay

## 2024-03-31 ENCOUNTER — Other Ambulatory Visit: Payer: Self-pay | Admitting: Internal Medicine

## 2024-03-31 ENCOUNTER — Ambulatory Visit
Admission: RE | Admit: 2024-03-31 | Discharge: 2024-03-31 | Disposition: A | Discharge status: Home / Self Care | Source: Ambulatory Visit | Attending: Internal Medicine | Admitting: Internal Medicine

## 2024-03-31 DIAGNOSIS — R001 Bradycardia, unspecified: Secondary | ICD-10-CM

## 2024-03-31 MED ORDER — GADOBUTROL 1 MMOL/ML IV SOLN
10.0000 mL | Freq: Once | INTRAVENOUS | Status: AC | PRN
  Administered 2024-03-31: 10 mL via INTRAVENOUS

## 2024-04-08 ENCOUNTER — Encounter: Payer: Self-pay | Admitting: Family Medicine

## 2024-06-04 ENCOUNTER — Ambulatory Visit: Admitting: Family Medicine
